# Patient Record
Sex: Female | Born: 1940 | Race: White | Hispanic: No | Smoking: Current every day smoker
Health system: Southern US, Community
[De-identification: ages and names within clinical notes are randomized; demographics above are authoritative.]

## PROBLEM LIST (undated history)

## (undated) DIAGNOSIS — I714 Abdominal aortic aneurysm, without rupture, unspecified: Secondary | ICD-10-CM

## (undated) DIAGNOSIS — K52831 Collagenous colitis: Secondary | ICD-10-CM

## (undated) DIAGNOSIS — J189 Pneumonia, unspecified organism: Secondary | ICD-10-CM

## (undated) DIAGNOSIS — R32 Unspecified urinary incontinence: Secondary | ICD-10-CM

## (undated) DIAGNOSIS — F32A Depression, unspecified: Secondary | ICD-10-CM

## (undated) DIAGNOSIS — F419 Anxiety disorder, unspecified: Secondary | ICD-10-CM

## (undated) DIAGNOSIS — I10 Essential (primary) hypertension: Secondary | ICD-10-CM

## (undated) DIAGNOSIS — E039 Hypothyroidism, unspecified: Secondary | ICD-10-CM

## (undated) DIAGNOSIS — I639 Cerebral infarction, unspecified: Secondary | ICD-10-CM

## (undated) DIAGNOSIS — E538 Deficiency of other specified B group vitamins: Secondary | ICD-10-CM

## (undated) DIAGNOSIS — M810 Age-related osteoporosis without current pathological fracture: Secondary | ICD-10-CM

## (undated) DIAGNOSIS — C3492 Malignant neoplasm of unspecified part of left bronchus or lung: Secondary | ICD-10-CM

## (undated) DIAGNOSIS — M5030 Other cervical disc degeneration, unspecified cervical region: Secondary | ICD-10-CM

## (undated) DIAGNOSIS — Z720 Tobacco use: Secondary | ICD-10-CM

## (undated) DIAGNOSIS — F329 Major depressive disorder, single episode, unspecified: Secondary | ICD-10-CM

## (undated) DIAGNOSIS — N189 Chronic kidney disease, unspecified: Secondary | ICD-10-CM

## (undated) DIAGNOSIS — E785 Hyperlipidemia, unspecified: Secondary | ICD-10-CM

## (undated) DIAGNOSIS — K469 Unspecified abdominal hernia without obstruction or gangrene: Secondary | ICD-10-CM

## (undated) DIAGNOSIS — J449 Chronic obstructive pulmonary disease, unspecified: Secondary | ICD-10-CM

## (undated) HISTORY — DX: Collagenous colitis: K52.831

## (undated) HISTORY — DX: Deficiency of other specified B group vitamins: E53.8

## (undated) HISTORY — DX: Essential (primary) hypertension: I10

## (undated) HISTORY — PX: ABDOMINAL HYSTERECTOMY: SHX81

## (undated) HISTORY — DX: Pneumonia, unspecified organism: J18.9

## (undated) HISTORY — DX: Hyperlipidemia, unspecified: E78.5

## (undated) HISTORY — DX: Tobacco use: Z72.0

## (undated) HISTORY — DX: Major depressive disorder, single episode, unspecified: F32.9

## (undated) HISTORY — PX: APPENDECTOMY: SHX54

## (undated) HISTORY — PX: BACK SURGERY: SHX140

## (undated) HISTORY — DX: Cerebral infarction, unspecified: I63.9

## (undated) HISTORY — DX: Unspecified urinary incontinence: R32

## (undated) HISTORY — DX: Age-related osteoporosis without current pathological fracture: M81.0

## (undated) HISTORY — PX: ABDOMINAL AORTIC ANEURYSM REPAIR: SUR1152

## (undated) HISTORY — DX: Unspecified abdominal hernia without obstruction or gangrene: K46.9

## (undated) HISTORY — DX: Other cervical disc degeneration, unspecified cervical region: M50.30

## (undated) HISTORY — PX: CHOLECYSTECTOMY: SHX55

## (undated) HISTORY — DX: Depression, unspecified: F32.A

## (undated) HISTORY — DX: Chronic kidney disease, unspecified: N18.9

---

## 2004-03-26 ENCOUNTER — Other Ambulatory Visit: Payer: Self-pay

## 2004-03-27 ENCOUNTER — Other Ambulatory Visit: Payer: Self-pay

## 2004-07-10 ENCOUNTER — Inpatient Hospital Stay: Payer: Self-pay | Admitting: Unknown Physician Specialty

## 2005-05-29 ENCOUNTER — Emergency Department: Payer: Self-pay | Admitting: Emergency Medicine

## 2005-12-05 ENCOUNTER — Ambulatory Visit: Payer: Self-pay | Admitting: Gastroenterology

## 2007-01-11 ENCOUNTER — Inpatient Hospital Stay: Payer: Self-pay | Admitting: Internal Medicine

## 2007-01-11 ENCOUNTER — Other Ambulatory Visit: Payer: Self-pay

## 2008-11-09 ENCOUNTER — Ambulatory Visit: Payer: Self-pay | Admitting: *Deleted

## 2009-11-08 ENCOUNTER — Ambulatory Visit: Payer: Self-pay | Admitting: Ophthalmology

## 2009-11-12 ENCOUNTER — Emergency Department: Payer: Self-pay | Admitting: Emergency Medicine

## 2009-11-15 ENCOUNTER — Ambulatory Visit: Payer: Self-pay | Admitting: Ophthalmology

## 2009-12-06 ENCOUNTER — Emergency Department: Payer: Self-pay | Admitting: Emergency Medicine

## 2009-12-20 ENCOUNTER — Ambulatory Visit: Payer: Self-pay | Admitting: General Surgery

## 2009-12-27 ENCOUNTER — Ambulatory Visit: Payer: Self-pay | Admitting: Ophthalmology

## 2010-05-20 ENCOUNTER — Ambulatory Visit: Payer: Self-pay | Admitting: General Surgery

## 2010-05-23 LAB — PATHOLOGY REPORT

## 2011-01-30 ENCOUNTER — Emergency Department: Payer: Self-pay | Admitting: *Deleted

## 2011-02-01 ENCOUNTER — Emergency Department: Payer: Self-pay | Admitting: *Deleted

## 2011-02-14 ENCOUNTER — Emergency Department: Payer: Self-pay | Admitting: *Deleted

## 2011-02-21 ENCOUNTER — Ambulatory Visit: Payer: Self-pay

## 2011-02-24 ENCOUNTER — Ambulatory Visit: Payer: Self-pay | Admitting: Unknown Physician Specialty

## 2011-02-28 ENCOUNTER — Ambulatory Visit: Payer: Self-pay | Admitting: Unknown Physician Specialty

## 2011-03-03 LAB — PATHOLOGY REPORT

## 2011-03-20 ENCOUNTER — Ambulatory Visit: Payer: Self-pay | Admitting: Surgery

## 2011-06-01 ENCOUNTER — Emergency Department: Payer: Self-pay | Admitting: Unknown Physician Specialty

## 2013-06-05 ENCOUNTER — Emergency Department: Payer: Self-pay | Admitting: Emergency Medicine

## 2013-06-06 LAB — COMPREHENSIVE METABOLIC PANEL
Albumin: 3.4 g/dL (ref 3.4–5.0)
Alkaline Phosphatase: 81 U/L
Anion Gap: 7 (ref 7–16)
Co2: 26 mmol/L (ref 21–32)
Creatinine: 1.01 mg/dL (ref 0.60–1.30)
EGFR (African American): >60
EGFR (Non-African Amer.): 55 — ABNORMAL LOW
Osmolality: 277 (ref 275–301)
Potassium: 4 mmol/L (ref 3.5–5.1)
SGPT (ALT): 18 U/L (ref 12–78)
Total Protein: 6.8 g/dL (ref 6.4–8.2)

## 2013-06-06 LAB — CBC WITH DIFFERENTIAL/PLATELET
Eosinophil #: 0.2 10*3/uL (ref 0.0–0.7)
Eosinophil %: 2.2 %
HCT: 36.3 % (ref 35.0–47.0)
HGB: 11.4 g/dL — ABNORMAL LOW (ref 12.0–16.0)
Lymphocyte #: 1.5 10*3/uL (ref 1.0–3.6)
Lymphocyte %: 18.3 %
MCH: 25.7 pg — ABNORMAL LOW (ref 26.0–34.0)
MCHC: 31.4 g/dL — ABNORMAL LOW (ref 32.0–36.0)
MCV: 82 fL (ref 80–100)
Monocyte #: 0.8 x10 3/mm (ref 0.2–0.9)
Neutrophil %: 69.2 %
Platelet: 187 10*3/uL (ref 150–440)
RBC: 4.43 10*6/uL (ref 3.80–5.20)
WBC: 8.4 10*3/uL (ref 3.6–11.0)

## 2013-06-06 LAB — URINALYSIS, COMPLETE
Blood: NEGATIVE
Glucose,UR: NEGATIVE mg/dL (ref 0–75)
Ketone: NEGATIVE
Nitrite: POSITIVE
Specific Gravity: 1.023 (ref 1.003–1.030)
Squamous Epithelial: 2
WBC UR: 37 /HPF (ref 0–5)

## 2013-06-08 LAB — URINE CULTURE

## 2013-07-02 ENCOUNTER — Ambulatory Visit: Payer: Self-pay | Admitting: Orthopedic Surgery

## 2013-07-10 ENCOUNTER — Ambulatory Visit: Payer: Self-pay | Admitting: Orthopedic Surgery

## 2013-07-10 DIAGNOSIS — R0602 Shortness of breath: Secondary | ICD-10-CM

## 2013-09-11 ENCOUNTER — Ambulatory Visit: Payer: Self-pay | Admitting: Orthopedic Surgery

## 2013-09-12 LAB — PATHOLOGY REPORT

## 2014-01-21 ENCOUNTER — Ambulatory Visit: Payer: Self-pay | Admitting: Family Medicine

## 2014-09-08 ENCOUNTER — Emergency Department: Payer: Self-pay | Admitting: Emergency Medicine

## 2014-09-08 DIAGNOSIS — I10 Essential (primary) hypertension: Secondary | ICD-10-CM | POA: Diagnosis not present

## 2014-09-08 DIAGNOSIS — J449 Chronic obstructive pulmonary disease, unspecified: Secondary | ICD-10-CM | POA: Diagnosis not present

## 2014-09-08 DIAGNOSIS — Z7902 Long term (current) use of antithrombotics/antiplatelets: Secondary | ICD-10-CM | POA: Diagnosis not present

## 2014-09-08 DIAGNOSIS — Z87891 Personal history of nicotine dependence: Secondary | ICD-10-CM | POA: Diagnosis not present

## 2014-09-08 DIAGNOSIS — Z79899 Other long term (current) drug therapy: Secondary | ICD-10-CM | POA: Diagnosis not present

## 2014-09-08 DIAGNOSIS — R0789 Other chest pain: Secondary | ICD-10-CM | POA: Diagnosis not present

## 2014-09-16 ENCOUNTER — Emergency Department: Payer: Self-pay | Admitting: Emergency Medicine

## 2014-09-16 DIAGNOSIS — R55 Syncope and collapse: Secondary | ICD-10-CM | POA: Diagnosis not present

## 2014-09-16 DIAGNOSIS — I10 Essential (primary) hypertension: Secondary | ICD-10-CM | POA: Diagnosis not present

## 2014-09-16 DIAGNOSIS — S22000A Wedge compression fracture of unspecified thoracic vertebra, initial encounter for closed fracture: Secondary | ICD-10-CM | POA: Diagnosis not present

## 2014-09-16 DIAGNOSIS — M8448XA Pathological fracture, other site, initial encounter for fracture: Secondary | ICD-10-CM | POA: Diagnosis not present

## 2014-09-16 DIAGNOSIS — S3991XA Unspecified injury of abdomen, initial encounter: Secondary | ICD-10-CM | POA: Diagnosis not present

## 2014-09-16 DIAGNOSIS — I77810 Thoracic aortic ectasia: Secondary | ICD-10-CM | POA: Diagnosis not present

## 2014-09-16 DIAGNOSIS — S060X9A Concussion with loss of consciousness of unspecified duration, initial encounter: Secondary | ICD-10-CM | POA: Diagnosis not present

## 2014-09-16 DIAGNOSIS — I2510 Atherosclerotic heart disease of native coronary artery without angina pectoris: Secondary | ICD-10-CM | POA: Diagnosis not present

## 2014-09-18 DIAGNOSIS — I700 Atherosclerosis of aorta: Secondary | ICD-10-CM | POA: Diagnosis not present

## 2014-09-18 DIAGNOSIS — I10 Essential (primary) hypertension: Secondary | ICD-10-CM | POA: Diagnosis not present

## 2014-09-18 DIAGNOSIS — F17200 Nicotine dependence, unspecified, uncomplicated: Secondary | ICD-10-CM | POA: Diagnosis not present

## 2014-09-18 DIAGNOSIS — I639 Cerebral infarction, unspecified: Secondary | ICD-10-CM | POA: Diagnosis not present

## 2014-09-18 DIAGNOSIS — I714 Abdominal aortic aneurysm, without rupture: Secondary | ICD-10-CM | POA: Diagnosis not present

## 2014-09-18 DIAGNOSIS — I701 Atherosclerosis of renal artery: Secondary | ICD-10-CM | POA: Diagnosis not present

## 2014-10-24 NOTE — Op Note (Signed)
PATIENT NAME:  Melody Green, KUSHNIR MR#:  [id number] DATE OF BIRTH:  11-26-1940  DATE OF PROCEDURE:  09/11/2013  PREOPERATIVE DIAGNOSIS: T10 compression fracture.   POSTOPERATIVE DIAGNOSIS: T10 compression fracture.  PROCEDURE: T10 kyphoplasty and biopsy.   ANESTHESIA: MAC.   SURGEON: Hessie Knows, M.D.   DESCRIPTION OF PROCEDURE: The patient was brought to the operating room and after adequate anesthesia was obtained, the patient was placed prone and good visualization on AP and lateral projections was obtained. After timeout procedure was completed, 5 mL of 1% Xylocaine was infiltrated on either side of the vertebral body as initial local anesthetic. The back was then prepped and draped in the usual sterile fashion and a repeat timeout procedure completed. Spinal needle was used to give local anesthetic down to the pedicle on the right side. A small incision was made. A trocar was placed, a perpendicular approach with good position of the initial trocar. Biopsy was obtained and with drilling, the midline was crossed and so it was felt 1 side stick was all that was required. The balloon was inflated to 3 mL. There was good pressure and no difficulty of balloon inflation. The balloon was then removed and the cavity filled with approximately 3.5 mL of bone cement. There is good interdigitation of the bone and good fill on the right and left sides. The trocar was removed and permanent C-arm views were obtained. The wound was covered with Dermabond followed by a Band-Aid. The patient was sent to the recovery room in stable condition.   ESTIMATED BLOOD LOSS: Minimal.   COMPLICATIONS: None.   SPECIMEN: T10 vertebral body biopsy.   ____________________________ Laurene Footman, MD [email or web address] D: 09/11/2013 18:51:55 ET T: 09/12/2013 06:52:02 ET JOB#: [id number]  cc: Laurene Footman, MD, <Dictator> Laurene Footman MD ELECTRONICALLY SIGNED 09/12/2013 10:58

## 2014-11-18 DIAGNOSIS — Z833 Family history of diabetes mellitus: Secondary | ICD-10-CM | POA: Diagnosis not present

## 2014-11-18 DIAGNOSIS — I129 Hypertensive chronic kidney disease with stage 1 through stage 4 chronic kidney disease, or unspecified chronic kidney disease: Secondary | ICD-10-CM | POA: Diagnosis not present

## 2014-11-18 DIAGNOSIS — R55 Syncope and collapse: Secondary | ICD-10-CM | POA: Diagnosis not present

## 2014-11-18 DIAGNOSIS — E039 Hypothyroidism, unspecified: Secondary | ICD-10-CM | POA: Diagnosis not present

## 2014-12-04 ENCOUNTER — Encounter: Payer: Self-pay | Admitting: Family Medicine

## 2014-12-04 ENCOUNTER — Ambulatory Visit (INDEPENDENT_AMBULATORY_CARE_PROVIDER_SITE_OTHER): Payer: Commercial Managed Care - HMO | Admitting: Family Medicine

## 2014-12-04 VITALS — BP 131/78 | HR 79 | Temp 98.2°F | Ht 63.5 in | Wt 96.6 lb

## 2014-12-04 DIAGNOSIS — E871 Hypo-osmolality and hyponatremia: Secondary | ICD-10-CM | POA: Diagnosis not present

## 2014-12-04 DIAGNOSIS — I10 Essential (primary) hypertension: Secondary | ICD-10-CM | POA: Diagnosis not present

## 2014-12-04 DIAGNOSIS — N184 Chronic kidney disease, stage 4 (severe): Secondary | ICD-10-CM

## 2014-12-04 DIAGNOSIS — I129 Hypertensive chronic kidney disease with stage 1 through stage 4 chronic kidney disease, or unspecified chronic kidney disease: Secondary | ICD-10-CM | POA: Insufficient documentation

## 2014-12-04 DIAGNOSIS — R42 Dizziness and giddiness: Secondary | ICD-10-CM

## 2014-12-04 NOTE — Progress Notes (Signed)
BP 131/78 mmHg  Pulse 79  Temp(Src) 98.2 F (36.8 C)  Ht 5' 3.5" (1.613 m)  Wt 96 lb 9.6 oz (43.817 kg)  BMI 16.84 kg/m2  SpO2 97%  LMP  (Within Years)   Subjective:    Patient ID: CAMYRA VAETH, female    DOB: 07-Mar-1941, 74 y.o.   MRN: [id number]  HPI: Melody Green is a 74 y.o. female presenting on 12/04/2014 for Hypertension She notes that she is still not feeling well. She has continued with a blood pressure that keeps jumping up and down. She checks it several times a day, but continues with lots of dizziness. She notes that her BP jumps from the 87G systolic to the 811X, and she feels like it is limiting her activity at this time and also makes both her and her son very anxious that she is going to have another stroke.  HYPERTENSION-  Hypertension status: uncontrolled Satisfied with current treatment? no Duration of hypertension: chronic BP monitoring frequency:  a few times a day BP range:  BP medication side effects:  no Medication compliance: excellent compliance Aspirin: no Recurrent headaches: yes Visual changes: yes Palpitations: no Dyspnea: no Chest pain: no Lower extremity edema: no Dizzy/lightheaded: yes  DIZZINESS Duration: months, every day, still happening even after stopping medication Description of symptoms: lightheaded Duration of episode: seconds Dizziness frequency: recurrent Provoking factors: going from sitting to standing, moving around a lot Aggravating factors:  going from sitting to standing, moving around a lot Triggered by rolling over in bed: no Triggered by bending over: yes Aggravated by head movement: yes Aggravated by exertion, coughing, loud noises: no Recent head injury: no Recent or current viral symptoms: no History of vasovagal episodes: no Nausea: yes Vomiting: yes Tinnitus: no Hearing loss: no Aural fullness: no Headache: yes Photophobia/phonophobia: no Unsteady gait: no Postural instability: yes Diplopia, dysarthria,  dysphagia or weakness: no Related to exertion: yes Pallor: no Diaphoresis: no Dyspnea: no Chest pain: no  Relevant past medical, surgical, family and social history reviewed and updated as indicated. Interim medical history since our last visit reviewed. Allergies and medications reviewed and updated.  Current Outpatient Prescriptions on File Prior to Visit  Medication Sig  . citalopram (CELEXA) 10 MG tablet Take 10 mg by mouth daily.  . clopidogrel (PLAVIX) 75 MG tablet Take 75 mg by mouth daily.  Marland Kitchen levothyroxine (SYNTHROID, LEVOTHROID) 50 MCG tablet Take 50 mcg by mouth daily.  Marland Kitchen levothyroxine (SYNTHROID, LEVOTHROID) 75 MCG tablet Take 75 mcg by mouth daily before breakfast.  . LORazepam (ATIVAN) 1 MG tablet Take 1-2 mg by mouth daily as needed for anxiety.  . mometasone (NASONEX) 50 MCG/ACT nasal spray Place 2 sprays into the nose daily.  Marland Kitchen buPROPion (WELLBUTRIN SR) 150 MG 12 hr tablet Take 150 mg by mouth 2 (two) times daily.  Marland Kitchen gabapentin (NEURONTIN) 400 MG capsule Take 400 mg by mouth daily.  Marland Kitchen lisinopril (PRINIVIL,ZESTRIL) 5 MG tablet Take 5 mg by mouth daily.  . Nutritional Supplements (CARNATION INSTANT BREAKFAST PO) Take 1 Dose by mouth.   No current facility-administered medications on file prior to visit.    Review of Systems  Constitutional: Negative.   HENT: Negative.   Eyes: Negative.   Respiratory: Negative.   Cardiovascular: Negative.   Gastrointestinal: Positive for nausea, vomiting, diarrhea and constipation. Negative for abdominal pain and blood in stool.  Endocrine: Negative.   Psychiatric/Behavioral: Negative.     Per HPI unless specifically indicated above  Objective:    BP 131/78 mmHg  Pulse 79  Temp(Src) 98.2 F (36.8 C)  Ht 5' 3.5" (1.613 m)  Wt 96 lb 9.6 oz (43.817 kg)  BMI 16.84 kg/m2  SpO2 97%  LMP  (Within Years)  Wt Readings from Last 3 Encounters:  12/04/14 96 lb 9.6 oz (43.817 kg)  09/28/14 97 lb (43.999 kg)    Physical Exam   Constitutional: She appears well-developed and well-nourished.  HENT:  Head: Normocephalic and atraumatic.  Eyes: Conjunctivae and EOM are normal. Pupils are equal, round, and reactive to light.  Neck: Normal range of motion. Neck supple.  Cardiovascular: Normal rate and regular rhythm.   Pulmonary/Chest: Effort normal and breath sounds normal.        Assessment & Plan:   Problem List Items Addressed This Visit    HTN (hypertension) - Primary    Gale presents today for follow up on her HTN and her dizziness. She is still not feeling well. She has continued with a very labile blood pressure. It has been going from systolics in the 67E to systolics in the 720N. She is very anxious about possibly having another stroke, but is also anxious that she may fall and pass out. BP is stable today, but given patient's continued symptoms off antihypertensive medication, we will refer to cardiology for evaluation and suggestions regarding her blood pressure.       Relevant Medications   atorvastatin (LIPITOR) 20 MG tablet   Other Relevant Orders   Ambulatory referral to Cardiology   Dizziness    Her dizziness seems to be multifactorial. It may be due to her hyponatremia, as discussed below. It may be due to her labile HTN, which is what she thinks it might be. It may be due to her polypharmacy and her many psychiatric medications. Her thyroid function was normal the last time we checked 2 weeks ago. She was not anemic. It does not seem to be positional. We will get her in to see cardiology for evaluation of her labile blood pressure. She is due to come back here in 3 weeks for her regular follow up. Continue to monitor. Information regarding dizziness given to patient today.       Hyponatremia    Sodium 130 last visit- potentially from her citalopram. She had been slightly liberalizing salt for the last 2 weeks. BMP checked today for recheck. Await results. Patient returning in 3 weeks for regular  visit and continue to monitor.       Relevant Orders   Basic metabolic panel       Follow up plan: Return As scheduled for appt with MAC.

## 2014-12-04 NOTE — Assessment & Plan Note (Signed)
Melody Green presents today for follow up on her HTN and her dizziness. She is still not feeling well. She has continued with a very labile blood pressure. It has been going from systolics in the 47M to systolics in the 546T. She is very anxious about possibly having another stroke, but is also anxious that she may fall and pass out. BP is stable today, but given patient's continued symptoms off antihypertensive medication, we will refer to cardiology for evaluation and suggestions regarding her blood pressure.

## 2014-12-04 NOTE — Assessment & Plan Note (Signed)
Her dizziness seems to be multifactorial. It may be due to her hyponatremia, as discussed below. It may be due to her labile HTN, which is what she thinks it might be. It may be due to her polypharmacy and her many psychiatric medications. Her thyroid function was normal the last time we checked 2 weeks ago. She was not anemic. It does not seem to be positional. We will get her in to see cardiology for evaluation of her labile blood pressure. She is due to come back here in 3 weeks for her regular follow up. Continue to monitor. Information regarding dizziness given to patient today.

## 2014-12-04 NOTE — Assessment & Plan Note (Signed)
Sodium 130 last visit- potentially from her citalopram. She had been slightly liberalizing salt for the last 2 weeks. BMP checked today for recheck. Await results. Patient returning in 3 weeks for regular visit and continue to monitor.

## 2014-12-04 NOTE — Patient Instructions (Signed)
Dizziness Dizziness is a common problem. It is a feeling of unsteadiness or light-headedness. You may feel like you are about to faint. Dizziness can lead to injury if you stumble or fall. A person of any age group can suffer from dizziness, but dizziness is more common in older adults. CAUSES  Dizziness can be caused by many different things, including:  Middle ear problems.  Standing for too long.  Infections.  An allergic reaction.  Aging.  An emotional response to something, such as the sight of blood.  Side effects of medicines.  Tiredness.  Problems with circulation or blood pressure.  Excessive use of alcohol or medicines, or illegal drug use.  Breathing too fast (hyperventilation).  An irregular heart rhythm (arrhythmia).  A low red blood cell count (anemia).  Pregnancy.  Vomiting, diarrhea, fever, or other illnesses that cause body fluid loss (dehydration).  Diseases or conditions such as Parkinson's disease, high blood pressure (hypertension), diabetes, and thyroid problems.  Exposure to extreme heat. DIAGNOSIS  Your health care provider will ask about your symptoms, perform a physical exam, and perform an electrocardiogram (ECG) to record the electrical activity of your heart. Your health care provider may also perform other heart or blood tests to determine the cause of your dizziness. These may include:  Transthoracic echocardiogram (TTE). During echocardiography, sound waves are used to evaluate how blood flows through your heart.  Transesophageal echocardiogram (TEE).  Cardiac monitoring. This allows your health care provider to monitor your heart rate and rhythm in real time.  Holter monitor. This is a portable device that records your heartbeat and can help diagnose heart arrhythmias. It allows your health care provider to track your heart activity for several days if needed.  Stress tests by exercise or by giving medicine that makes the heart beat  faster. TREATMENT  Treatment of dizziness depends on the cause of your symptoms and can vary greatly. HOME CARE INSTRUCTIONS   Drink enough fluids to keep your urine clear or pale yellow. This is especially important in very hot weather. In older adults, it is also important in cold weather.  Take your medicine exactly as directed if your dizziness is caused by medicines. When taking blood pressure medicines, it is especially important to get up slowly.  Rise slowly from chairs and steady yourself until you feel okay.  In the morning, first sit up on the side of the bed. When you feel okay, stand slowly while holding onto something until you know your balance is fine.  Move your legs often if you need to stand in one place for a long time. Tighten and relax your muscles in your legs while standing.  Have someone stay with you for 1-2 days if dizziness continues to be a problem. Do this until you feel you are well enough to stay alone. Have the person call your health care provider if he or she notices changes in you that are concerning.  Do not drive or use heavy machinery if you feel dizzy.  Do not drink alcohol. SEEK IMMEDIATE MEDICAL CARE IF:   Your dizziness or light-headedness gets worse.  You feel nauseous or vomit.  You have problems talking, walking, or using your arms, hands, or legs.  You feel weak.  You are not thinking clearly or you have trouble forming sentences. It may take a friend or family member to notice this.  You have chest pain, abdominal pain, shortness of breath, or sweating.  Your vision changes.  You notice   any bleeding.  You have side effects from medicine that seems to be getting worse rather than better. MAKE SURE YOU:   Understand these instructions.  Will watch your condition.  Will get help right away if you are not doing well or get worse. Document Released: 12/13/2000 Document Revised: 06/24/2013 Document Reviewed: 01/06/2011 [facility]  Patient Information 2015 [place], [place]. This information is not intended to replace advice given to you by your health care provider. Make sure you discuss any questions you have with your health care provider. Hyponatremia  Hyponatremia is when the salt (sodium) in your blood is low. When salt becomes low, your cells take in extra water and puff up (swell). The puffiness can happen in the whole body. It mostly affects the brain and is very serious.  HOME CARE  Only take medicine as told by your doctor.  Follow any diet instructions you were given. This includes limiting how much fluid you drink.  Keep all doctor visits for tests as told.  Avoid alcohol and drugs. GET HELP RIGHT AWAY IF:  You start to twitch and shake (seize).  You pass out (faint).  You continue to have watery poop (diarrhea) or you throw up (vomit).  You feel sick to your stomach (nauseous).  You are tired (fatigued), have a headache, are confused, or feel weak.  Your problems that first brought you to the doctor come back.  You have trouble following your diet instructions. MAKE SURE YOU:   Understand these instructions.  Will watch your condition.  Will get help right away if you are not doing well or get worse. Document Released: 03/01/2011 Document Revised: 09/11/2011 Document Reviewed: 03/01/2011 [facility] Patient Information 2015 [place], [place]. This information is not intended to replace advice given to you by your health care provider. Make sure you discuss any questions you have with your health care provider.

## 2014-12-05 LAB — BASIC METABOLIC PANEL
BUN / CREAT RATIO: 11 (ref 11–26)
BUN: 11 mg/dL (ref 8–27)
CO2: 23 mmol/L (ref 18–29)
Calcium: 9.1 mg/dL (ref 8.7–10.3)
Chloride: 99 mmol/L (ref 97–108)
Creatinine, Ser: 0.97 mg/dL (ref 0.57–1.00)
GFR calc non Af Amer: 58 mL/min/{1.73_m2} — ABNORMAL LOW (ref >59)
GFR, EST AFRICAN AMERICAN: 67 mL/min/{1.73_m2} (ref >59)
Glucose: 65 mg/dL (ref 65–99)
Potassium: 4.3 mmol/L (ref 3.5–5.2)
Sodium: 138 mmol/L (ref 134–144)

## 2014-12-10 DIAGNOSIS — Z961 Presence of intraocular lens: Secondary | ICD-10-CM | POA: Diagnosis not present

## 2014-12-15 ENCOUNTER — Other Ambulatory Visit: Payer: Self-pay | Admitting: Family Medicine

## 2014-12-18 DIAGNOSIS — E782 Mixed hyperlipidemia: Secondary | ICD-10-CM | POA: Diagnosis not present

## 2014-12-18 DIAGNOSIS — G454 Transient global amnesia: Secondary | ICD-10-CM | POA: Diagnosis not present

## 2014-12-18 DIAGNOSIS — I10 Essential (primary) hypertension: Secondary | ICD-10-CM | POA: Diagnosis not present

## 2014-12-21 DIAGNOSIS — Z1231 Encounter for screening mammogram for malignant neoplasm of breast: Secondary | ICD-10-CM | POA: Diagnosis not present

## 2014-12-23 ENCOUNTER — Ambulatory Visit (INDEPENDENT_AMBULATORY_CARE_PROVIDER_SITE_OTHER): Payer: Commercial Managed Care - HMO | Admitting: Family Medicine

## 2014-12-23 ENCOUNTER — Encounter: Payer: Self-pay | Admitting: Family Medicine

## 2014-12-23 VITALS — BP 143/76 | HR 81 | Temp 98.0°F | Ht 63.9 in | Wt 96.6 lb

## 2014-12-23 DIAGNOSIS — I10 Essential (primary) hypertension: Secondary | ICD-10-CM | POA: Diagnosis not present

## 2014-12-23 DIAGNOSIS — E785 Hyperlipidemia, unspecified: Secondary | ICD-10-CM

## 2014-12-23 DIAGNOSIS — F329 Major depressive disorder, single episode, unspecified: Secondary | ICD-10-CM | POA: Diagnosis not present

## 2014-12-23 DIAGNOSIS — F32A Depression, unspecified: Secondary | ICD-10-CM | POA: Insufficient documentation

## 2014-12-23 DIAGNOSIS — Z0000 Encounter for general adult medical examination without abnormal findings: Secondary | ICD-10-CM | POA: Diagnosis not present

## 2014-12-23 LAB — URINALYSIS, ROUTINE W REFLEX MICROSCOPIC
Bilirubin, UA: NEGATIVE
Glucose, UA: NEGATIVE
Ketones, UA: NEGATIVE
Nitrite, UA: NEGATIVE
Protein, UA: NEGATIVE
RBC, UA: NEGATIVE
SPEC GRAV UA: 1.01 (ref 1.005–1.030)
Urobilinogen, Ur: 0.2 mg/dL (ref 0.2–1.0)
pH, UA: 6 (ref 5.0–7.5)

## 2014-12-23 LAB — MICROSCOPIC EXAMINATION

## 2014-12-23 MED ORDER — BUPROPION HCL ER (SR) 150 MG PO TB12: 150.0000 mg | ORAL_TABLET | Freq: Two times a day (BID) | ORAL | Status: DC

## 2014-12-23 MED ORDER — ATORVASTATIN CALCIUM 20 MG PO TABS: 20.0000 mg | ORAL_TABLET | Freq: Every day | ORAL | Status: DC

## 2014-12-23 MED ORDER — LEVOTHYROXINE SODIUM 75 MCG PO TABS: 75.0000 ug | ORAL_TABLET | Freq: Every day | ORAL | Status: DC

## 2014-12-23 MED ORDER — GABAPENTIN 400 MG PO CAPS: 400.0000 mg | ORAL_CAPSULE | Freq: Every day | ORAL | Status: DC

## 2014-12-23 MED ORDER — CITALOPRAM HYDROBROMIDE 10 MG PO TABS: 10.0000 mg | ORAL_TABLET | Freq: Every day | ORAL | Status: DC

## 2014-12-23 MED ORDER — AMLODIPINE BESYLATE 2.5 MG PO TABS: 2.5000 mg | ORAL_TABLET | Freq: Every day | ORAL | Status: DC

## 2014-12-23 MED ORDER — LEVOTHYROXINE SODIUM 50 MCG PO TABS: 50.0000 ug | ORAL_TABLET | Freq: Every day | ORAL | Status: DC

## 2014-12-23 MED ORDER — LISINOPRIL 5 MG PO TABS: 5.0000 mg | ORAL_TABLET | Freq: Every day | ORAL | Status: DC

## 2014-12-23 NOTE — Assessment & Plan Note (Signed)
The current medical regimen is effective;  continue present plan and medications.  

## 2014-12-23 NOTE — Progress Notes (Signed)
BP 143/76 mmHg  Pulse 81  Temp(Src) 98 F (36.7 C)  Ht 5' 3.9" (1.623 m)  Wt 96 lb 9.6 oz (43.817 kg)  BMI 16.63 kg/m2  SpO2 98%  LMP  (Within Years)   Subjective:    Patient ID: Melody Green, female    DOB: [birth year], 74 y.o.   MRN: [id number]  HPI: Melody Green is a 74 y.o. female  No chief complaint on file.  Medicare wellness Unable to show updates but depression and fall risk done Pt doing well Mult med prob stable Takes meds long term no side effects and every day  Has tx from derm for AK on nose which is red and peeling.   Relevant past medical, surgical, family and social history reviewed and updated as indicated. Interim medical history since our last visit reviewed. Allergies and medications reviewed and updated.  Review of Systems  Constitutional: Negative.   HENT: Negative.   Eyes: Negative.   Respiratory: Negative.   Cardiovascular: Negative.   Gastrointestinal: Positive for constipation.  Endocrine: Negative.   Genitourinary: Negative.   Musculoskeletal: Negative.   Skin: Negative.   Allergic/Immunologic: Negative.   Neurological: Negative.   Hematological: Negative.   Psychiatric/Behavioral: Negative.     Per HPI unless specifically indicated above     Objective:    BP 143/76 mmHg  Pulse 81  Temp(Src) 98 F (36.7 C)  Ht 5' 3.9" (1.623 m)  Wt 96 lb 9.6 oz (43.817 kg)  BMI 16.63 kg/m2  SpO2 98%  LMP  (Within Years)  Wt Readings from Last 3 Encounters:  12/23/14 96 lb 9.6 oz (43.817 kg)  12/04/14 96 lb 9.6 oz (43.817 kg)  09/28/14 97 lb (43.999 kg)    Physical Exam  Constitutional: She is oriented to person, place, and time. She appears well-developed and well-nourished.  HENT:  Head: Normocephalic and atraumatic.  Right Ear: External ear normal.  Left Ear: External ear normal.  Nose: Nose normal.  Mouth/Throat: Oropharynx is clear and moist.  Eyes: Conjunctivae and EOM are normal. Pupils are equal, round, and reactive to light.   Neck: Normal range of motion. Neck supple. Carotid bruit is not present.  Cardiovascular: Normal rate, regular rhythm and normal heart sounds.   No murmur heard. Pulmonary/Chest: Effort normal and breath sounds normal. Right breast exhibits no mass and no tenderness. Left breast exhibits no mass and no tenderness. Breasts are symmetrical.  Abdominal: Soft. Bowel sounds are normal. There is no hepatosplenomegaly.  Musculoskeletal: Normal range of motion.  Neurological: She is alert and oriented to person, place, and time.  Skin: No rash noted.  Psychiatric: She has a normal mood and affect. Her behavior is normal. Judgment and thought content normal.    Results for orders placed or performed in visit on 78/29/56  Basic metabolic panel  Result Value Ref Range   Glucose 65 65 - 99 mg/dL   BUN 11 8 - 27 mg/dL   Creatinine, Ser 0.97 0.57 - 1.00 mg/dL   GFR calc non Af Amer 58 (L) >59 mL/min/1.73   GFR calc Af Amer 67 >59 mL/min/1.73   BUN/Creatinine Ratio 11 11 - 26   Sodium 138 134 - 144 mmol/L   Potassium 4.3 3.5 - 5.2 mmol/L   Chloride 99 97 - 108 mmol/L   CO2 23 18 - 29 mmol/L   Calcium 9.1 8.7 - 10.3 mg/dL      Assessment & Plan:   Problem List Items Addressed This Visit  Cardiovascular and Mediastinum   HTN (hypertension) - Primary    The current medical regimen is effective;  continue present plan and medications.       Relevant Medications   amLODipine (NORVASC) 2.5 MG tablet     Other   Depression    The current medical regimen is effective;  continue present plan and medications.       Hyperlipidemia    The current medical regimen is effective;  continue present plan and medications.       Relevant Medications   amLODipine (NORVASC) 2.5 MG tablet       Follow up plan: Return in about 6 months (around 06/24/2015) for f/u meds, BMP,Lipids , ALT, AST.

## 2014-12-24 LAB — LIPID PANEL
CHOLESTEROL TOTAL: 162 mg/dL (ref 100–199)
Chol/HDL Ratio: 2.6 ratio units (ref 0.0–4.4)
HDL: 63 mg/dL (ref >39)
LDL Calculated: 85 mg/dL (ref 0–99)
Triglycerides: 69 mg/dL (ref 0–149)
VLDL CHOLESTEROL CAL: 14 mg/dL (ref 5–40)

## 2014-12-24 LAB — COMPREHENSIVE METABOLIC PANEL
A/G RATIO: 1.7 (ref 1.1–2.5)
ALT: 12 IU/L (ref 0–32)
AST: 21 IU/L (ref 0–40)
Albumin: 4 g/dL (ref 3.5–4.8)
Alkaline Phosphatase: 94 IU/L (ref 39–117)
BUN/Creatinine Ratio: 9 — ABNORMAL LOW (ref 11–26)
BUN: 8 mg/dL (ref 8–27)
Bilirubin Total: 0.3 mg/dL (ref 0.0–1.2)
CO2: 23 mmol/L (ref 18–29)
Calcium: 9.1 mg/dL (ref 8.7–10.3)
Chloride: 100 mmol/L (ref 97–108)
Creatinine, Ser: 0.9 mg/dL (ref 0.57–1.00)
GFR calc Af Amer: 72 mL/min/{1.73_m2} (ref >59)
GFR calc non Af Amer: 63 mL/min/{1.73_m2} (ref >59)
Globulin, Total: 2.4 g/dL (ref 1.5–4.5)
Glucose: 64 mg/dL — ABNORMAL LOW (ref 65–99)
Potassium: 4.7 mmol/L (ref 3.5–5.2)
Sodium: 138 mmol/L (ref 134–144)
TOTAL PROTEIN: 6.4 g/dL (ref 6.0–8.5)

## 2014-12-24 LAB — CBC WITH DIFFERENTIAL/PLATELET
BASOS: 1 %
Basophils Absolute: 0.1 10*3/uL (ref 0.0–0.2)
EOS (ABSOLUTE): 0.2 10*3/uL (ref 0.0–0.4)
EOS: 3 %
HEMOGLOBIN: 12.8 g/dL (ref 11.1–15.9)
Hematocrit: 39.8 % (ref 34.0–46.6)
Immature Grans (Abs): 0 10*3/uL (ref 0.0–0.1)
Immature Granulocytes: 0 %
LYMPHS ABS: 1.5 10*3/uL (ref 0.7–3.1)
Lymphs: 23 %
MCH: 26.8 pg (ref 26.6–33.0)
MCHC: 32.2 g/dL (ref 31.5–35.7)
MCV: 83 fL (ref 79–97)
MONOS ABS: 0.6 10*3/uL (ref 0.1–0.9)
Monocytes: 10 %
NEUTROS ABS: 4.1 10*3/uL (ref 1.4–7.0)
Neutrophils: 63 %
Platelets: 207 10*3/uL (ref 150–379)
RBC: 4.77 x10E6/uL (ref 3.77–5.28)
RDW: 14.6 % (ref 12.3–15.4)
WBC: 6.4 10*3/uL (ref 3.4–10.8)

## 2014-12-24 LAB — TSH: TSH: 3.61 u[IU]/mL (ref 0.450–4.500)

## 2014-12-28 ENCOUNTER — Telehealth: Payer: Self-pay | Admitting: Family Medicine

## 2014-12-28 NOTE — Telephone Encounter (Signed)
meds were all filled at appointment

## 2014-12-28 NOTE — Telephone Encounter (Signed)
Pt called needs refill on 3 different medications pt does not know the meds needed. Please call [place] @ [phone]. Thanks.

## 2014-12-30 ENCOUNTER — Other Ambulatory Visit: Payer: Self-pay | Admitting: Family Medicine

## 2014-12-30 ENCOUNTER — Telehealth: Payer: Self-pay

## 2014-12-30 DIAGNOSIS — F329 Major depressive disorder, single episode, unspecified: Secondary | ICD-10-CM

## 2014-12-30 DIAGNOSIS — F32A Depression, unspecified: Secondary | ICD-10-CM

## 2014-12-30 MED ORDER — GABAPENTIN 300 MG PO CAPS: 300.0000 mg | ORAL_CAPSULE | Freq: Every day | ORAL | Status: DC

## 2014-12-30 NOTE — Telephone Encounter (Signed)
Phone call As per note below  Need to check  Ask pt to be seen [facility]

## 2014-12-30 NOTE — Telephone Encounter (Signed)
Patient has a "sore, dark spot" on her breast and wants to discuss with MAC

## 2014-12-31 ENCOUNTER — Ambulatory Visit (INDEPENDENT_AMBULATORY_CARE_PROVIDER_SITE_OTHER): Payer: Commercial Managed Care - HMO | Admitting: Family Medicine

## 2014-12-31 ENCOUNTER — Encounter: Payer: Self-pay | Admitting: Family Medicine

## 2014-12-31 VITALS — BP 136/80 | HR 79 | Temp 98.4°F | Ht 64.0 in | Wt 98.4 lb

## 2014-12-31 DIAGNOSIS — R58 Hemorrhage, not elsewhere classified: Secondary | ICD-10-CM | POA: Diagnosis not present

## 2014-12-31 NOTE — Progress Notes (Signed)
BP 136/80 mmHg  Pulse 79  Temp(Src) 98.4 F (36.9 C)  Ht '5\' 4"'$  (1.626 m)  Wt 98 lb 6.4 oz (44.634 kg)  BMI 16.88 kg/m2  SpO2 98%  LMP  (Within Years)   Subjective:    Patient ID: Melody Green, female    DOB: [birth year], 74 y.o.   MRN: [id number]  HPI: Melody Green is a 74 y.o. female  Chief Complaint  Patient presents with  . Breast Mass    left breast, found it two days ago. She states that there is pain there at times that travels to the underarm. There is a dark spot there on her breast also.   SKIN LESION/BREAST LUMP Duration: 2 days Location: L breast with some discoloration Painful: yes- stinging feeling Itching: yes Onset: sudden Context: bigger Associated signs and symptoms: Nothing  Relevant past medical, surgical, family and social history reviewed and updated as indicated. Interim medical history since our last visit reviewed. Allergies and medications reviewed and updated.  Review of Systems  Constitutional: Negative.   Respiratory: Negative.   Cardiovascular: Negative.   Skin: Positive for color change. Negative for pallor, rash and wound.  Hematological: Negative.   Psychiatric/Behavioral: Negative.     Per HPI unless specifically indicated above     Objective:    BP 136/80 mmHg  Pulse 79  Temp(Src) 98.4 F (36.9 C)  Ht '5\' 4"'$  (1.626 m)  Wt 98 lb 6.4 oz (44.634 kg)  BMI 16.88 kg/m2  SpO2 98%  LMP  (Within Years)  Wt Readings from Last 3 Encounters:  12/31/14 98 lb 6.4 oz (44.634 kg)  12/23/14 96 lb 9.6 oz (43.817 kg)  12/04/14 96 lb 9.6 oz (43.817 kg)    Physical Exam  Constitutional: She is oriented to person, place, and time. She appears well-developed and well-nourished. No distress.  HENT:  Head: Normocephalic and atraumatic.  Right Ear: Hearing normal.  Left Ear: Hearing normal.  Nose: Nose normal.  Eyes: Conjunctivae and lids are normal. Right eye exhibits no discharge. Left eye exhibits no discharge. No scleral icterus.    Cardiovascular: Normal rate, regular rhythm and normal heart sounds.  Exam reveals no gallop and no friction rub.   No murmur heard. Pulmonary/Chest: Effort normal. No respiratory distress. She has no wheezes. She has no rales. She exhibits no tenderness. Right breast exhibits no inverted nipple, no mass, no nipple discharge, no skin change and no tenderness. Left breast exhibits skin change. Left breast exhibits no inverted nipple, no mass, no nipple discharge and no tenderness. Breasts are symmetrical.    Ecchymosis in the inner quadrants at 9 o'clock of the L breast   Musculoskeletal: Normal range of motion.  Neurological: She is alert and oriented to person, place, and time.  Skin: Skin is intact. No rash noted.  Psychiatric: She has a normal mood and affect. Her speech is normal and behavior is normal. Judgment and thought content normal. Cognition and memory are normal.    Results for orders placed or performed in visit on 12/23/14  Microscopic Examination  Result Value Ref Range   WBC, UA 0-5 0 -  5 /hpf   RBC, UA 0-2 0 -  2 /hpf   Epithelial Cells (non renal) 0-10 0 - 10 /hpf   Bacteria, UA Few None seen/Few  Comprehensive metabolic panel  Result Value Ref Range   Glucose 64 (L) 65 - 99 mg/dL   BUN 8 8 - 27 mg/dL   Creatinine, Ser  0.90 0.57 - 1.00 mg/dL   GFR calc non Af Amer 63 >59 mL/min/1.73   GFR calc Af Amer 72 >59 mL/min/1.73   BUN/Creatinine Ratio 9 (L) 11 - 26   Sodium 138 134 - 144 mmol/L   Potassium 4.7 3.5 - 5.2 mmol/L   Chloride 100 97 - 108 mmol/L   CO2 23 18 - 29 mmol/L   Calcium 9.1 8.7 - 10.3 mg/dL   Total Protein 6.4 6.0 - 8.5 g/dL   Albumin 4.0 3.5 - 4.8 g/dL   Globulin, Total 2.4 1.5 - 4.5 g/dL   Albumin/Globulin Ratio 1.7 1.1 - 2.5   Bilirubin Total 0.3 0.0 - 1.2 mg/dL   Alkaline Phosphatase 94 39 - 117 IU/L   AST 21 0 - 40 IU/L   ALT 12 0 - 32 IU/L  CBC with Differential/Platelet  Result Value Ref Range   WBC 6.4 3.4 - 10.8 x10E3/uL   RBC 4.77  3.77 - 5.28 x10E6/uL   Hemoglobin 12.8 11.1 - 15.9 g/dL   Hematocrit 39.8 34.0 - 46.6 %   MCV 83 79 - 97 fL   MCH 26.8 26.6 - 33.0 pg   MCHC 32.2 31.5 - 35.7 g/dL   RDW 14.6 12.3 - 15.4 %   Platelets 207 150 - 379 x10E3/uL   NEUTROPHILS 63 %   Lymphs 23 %   Monocytes 10 %   Eos 3 %   Basos 1 %   Neutrophils Absolute 4.1 1.4 - 7.0 x10E3/uL   Lymphocytes Absolute 1.5 0.7 - 3.1 x10E3/uL   Monocytes Absolute 0.6 0.1 - 0.9 x10E3/uL   EOS (ABSOLUTE) 0.2 0.0 - 0.4 x10E3/uL   Basophils Absolute 0.1 0.0 - 0.2 x10E3/uL   Immature Granulocytes 0 %   Immature Grans (Abs) 0.0 0.0 - 0.1 x10E3/uL  Urinalysis, Routine w reflex microscopic (not at [facility])  Result Value Ref Range   Specific Gravity, UA 1.010 1.005 - 1.030   pH, UA 6.0 5.0 - 7.5   Color, UA Yellow Yellow   Appearance Ur Clear Clear   Leukocytes, UA 1+ (A) Negative   Protein, UA Negative Negative/Trace   Glucose, UA Negative Negative   Ketones, UA Negative Negative   RBC, UA Negative Negative   Bilirubin, UA Negative Negative   Urobilinogen, Ur 0.2 0.2 - 1.0 mg/dL   Nitrite, UA Negative Negative   Microscopic Examination See below:   Lipid panel  Result Value Ref Range   Cholesterol, Total 162 100 - 199 mg/dL   Triglycerides 69 0 - 149 mg/dL   HDL 63 >39 mg/dL   VLDL Cholesterol Cal 14 5 - 40 mg/dL   LDL Calculated 85 0 - 99 mg/dL   Chol/HDL Ratio 2.6 0.0 - 4.4 ratio units  TSH  Result Value Ref Range   TSH 3.610 0.450 - 4.500 uIU/mL      Assessment & Plan:   Problem List Items Addressed This Visit    None    Visit Diagnoses    Ecchymosis    -  Primary    Appears to be a bruise on her breast without lump. Will have her ice or heat and return in 1 week. If still present, will obtain and ultrasound.         Follow up plan: Return in about 1 week (around 01/07/2015).

## 2014-12-31 NOTE — Patient Instructions (Signed)
Chest Contusion °A contusion is a deep bruise. Bruises happen when an injury causes bleeding under the skin. Signs of bruising include pain, puffiness (swelling), and discolored skin. The bruise may turn blue, purple, or yellow.  °HOME CARE °· Put ice on the injured area. °¨ Put ice in a plastic bag. °¨ Place a towel between the skin and the bag. °¨ Leave the ice on for 15-20 minutes at a time, 03-04 times a day for the first 48 hours. °· Only take medicine as told by your doctor. °· Rest. °· Take deep breaths (deep-breathing exercises) as told by your doctor. °· Stop smoking if you smoke. °· Do not lift objects over 5 pounds (2.3 kilograms) for 3 days or longer if told by your doctor. °GET HELP RIGHT AWAY IF:  °· You have more bruising or puffiness. °· You have pain that gets worse. °· You have trouble breathing. °· You are dizzy, weak, or pass out (faint). °· You have blood in your pee (urine) or poop (stool). °· You cough up or throw up (vomit) blood. °· Your puffiness or pain is not helped with medicines. °MAKE SURE YOU:  °· Understand these instructions. °· Will watch your condition. °· Will get help right away if you are not doing well or get worse. °Document Released: 12/06/2007 Document Revised: 03/13/2012 Document Reviewed: 12/11/2011 °[employer]® Patient Information ©2015 [employer]. This information is not intended to replace advice given to you by your health care provider. Make sure you discuss any questions you have with your health care provider. ° °

## 2015-01-07 ENCOUNTER — Ambulatory Visit: Payer: Commercial Managed Care - HMO | Admitting: Family Medicine

## 2015-01-15 DIAGNOSIS — I10 Essential (primary) hypertension: Secondary | ICD-10-CM | POA: Diagnosis not present

## 2015-01-15 DIAGNOSIS — E782 Mixed hyperlipidemia: Secondary | ICD-10-CM | POA: Diagnosis not present

## 2015-01-20 DIAGNOSIS — I714 Abdominal aortic aneurysm, without rupture: Secondary | ICD-10-CM | POA: Diagnosis not present

## 2015-01-20 DIAGNOSIS — I739 Peripheral vascular disease, unspecified: Secondary | ICD-10-CM | POA: Diagnosis not present

## 2015-01-20 DIAGNOSIS — I10 Essential (primary) hypertension: Secondary | ICD-10-CM | POA: Diagnosis not present

## 2015-01-20 DIAGNOSIS — F17200 Nicotine dependence, unspecified, uncomplicated: Secondary | ICD-10-CM | POA: Diagnosis not present

## 2015-01-20 DIAGNOSIS — I700 Atherosclerosis of aorta: Secondary | ICD-10-CM | POA: Diagnosis not present

## 2015-01-20 DIAGNOSIS — I701 Atherosclerosis of renal artery: Secondary | ICD-10-CM | POA: Diagnosis not present

## 2015-01-20 DIAGNOSIS — I639 Cerebral infarction, unspecified: Secondary | ICD-10-CM | POA: Diagnosis not present

## 2015-01-20 DIAGNOSIS — M79609 Pain in unspecified limb: Secondary | ICD-10-CM | POA: Diagnosis not present

## 2015-03-05 ENCOUNTER — Other Ambulatory Visit: Payer: Self-pay | Admitting: Family Medicine

## 2015-03-22 ENCOUNTER — Encounter: Payer: Self-pay | Admitting: Family Medicine

## 2015-03-22 ENCOUNTER — Ambulatory Visit (INDEPENDENT_AMBULATORY_CARE_PROVIDER_SITE_OTHER): Payer: Commercial Managed Care - HMO | Admitting: Family Medicine

## 2015-03-22 VITALS — BP 122/75 | HR 75 | Temp 98.2°F | Wt 96.4 lb

## 2015-03-22 DIAGNOSIS — L299 Pruritus, unspecified: Secondary | ICD-10-CM | POA: Diagnosis not present

## 2015-03-22 DIAGNOSIS — E871 Hypo-osmolality and hyponatremia: Secondary | ICD-10-CM

## 2015-03-22 DIAGNOSIS — I10 Essential (primary) hypertension: Secondary | ICD-10-CM | POA: Diagnosis not present

## 2015-03-22 DIAGNOSIS — F329 Major depressive disorder, single episode, unspecified: Secondary | ICD-10-CM | POA: Diagnosis not present

## 2015-03-22 DIAGNOSIS — F32A Depression, unspecified: Secondary | ICD-10-CM

## 2015-03-22 DIAGNOSIS — R42 Dizziness and giddiness: Secondary | ICD-10-CM

## 2015-03-22 NOTE — Assessment & Plan Note (Signed)
The current medical regimen is effective;  continue present plan and medications.  

## 2015-03-22 NOTE — Progress Notes (Signed)
BP 122/75 mmHg  Pulse 75  Temp(Src) 98.2 F (36.8 C)  Wt 96 lb 6.4 oz (43.727 kg)  SpO2 98%  LMP  (Within Years)   Subjective:    Patient ID: Melody Green, female    DOB: 28-Jul-1940, 74 y.o.   MRN: [id number]  HPI: Melody Green is a 74 y.o. female  Chief Complaint  Patient presents with  . Pruritis    upper chest and back area  . occasional pain in left breast and axila    also itches  . dizziness when coming in office    thinks it was from rushing out of rain  No longer dizzy feels fine  Itching but no rash for all summer tried lotions etc no help No pills Coco butter helps the most.  BP doing well Reviewed medications no new changes takes medications faithfully no welts or hives no association with taking medication. Depression nerves stable taking thyroid faithfully  Relevant past medical, surgical, family and social history reviewed and updated as indicated. Interim medical history since our last visit reviewed. Allergies and medications reviewed and updated.  Review of Systems  Constitutional: Negative.  Negative for fever and fatigue.  HENT:       Dizziness changes nothing new patient has from time to time  Respiratory: Negative.   Cardiovascular: Negative.     Per HPI unless specifically indicated above     Objective:    BP 122/75 mmHg  Pulse 75  Temp(Src) 98.2 F (36.8 C)  Wt 96 lb 6.4 oz (43.727 kg)  SpO2 98%  LMP  (Within Years)  Wt Readings from Last 3 Encounters:  03/22/15 96 lb 6.4 oz (43.727 kg)  12/31/14 98 lb 6.4 oz (44.634 kg)  12/23/14 96 lb 9.6 oz (43.817 kg)    Physical Exam  Constitutional: She is oriented to person, place, and time. She appears well-developed and well-nourished. No distress.  HENT:  Head: Normocephalic and atraumatic.  Right Ear: Hearing normal.  Left Ear: Hearing normal.  Nose: Nose normal.  Eyes: Conjunctivae and lids are normal. Right eye exhibits no discharge. Left eye exhibits no discharge. No scleral  icterus.  Cardiovascular: Normal rate, regular rhythm and normal heart sounds.   Pulmonary/Chest: Effort normal and breath sounds normal. No respiratory distress.  Musculoskeletal: Normal range of motion.  Neurological: She is alert and oriented to person, place, and time.  Skin: Skin is warm, dry and intact. No rash noted. No erythema.  No evidence of any rash or excoriations on the chest and back.  Psychiatric: She has a normal mood and affect. Her speech is normal and behavior is normal. Judgment and thought content normal. Cognition and memory are normal.    Results for orders placed or performed in visit on 12/23/14  Microscopic Examination  Result Value Ref Range   WBC, UA 0-5 0 -  5 /hpf   RBC, UA 0-2 0 -  2 /hpf   Epithelial Cells (non renal) 0-10 0 - 10 /hpf   Bacteria, UA Few None seen/Few  Comprehensive metabolic panel  Result Value Ref Range   Glucose 64 (L) 65 - 99 mg/dL   BUN 8 8 - 27 mg/dL   Creatinine, Ser 0.90 0.57 - 1.00 mg/dL   GFR calc non Af Amer 63 >59 mL/min/1.73   GFR calc Af Amer 72 >59 mL/min/1.73   BUN/Creatinine Ratio 9 (L) 11 - 26   Sodium 138 134 - 144 mmol/L   Potassium 4.7 3.5 -  5.2 mmol/L   Chloride 100 97 - 108 mmol/L   CO2 23 18 - 29 mmol/L   Calcium 9.1 8.7 - 10.3 mg/dL   Total Protein 6.4 6.0 - 8.5 g/dL   Albumin 4.0 3.5 - 4.8 g/dL   Globulin, Total 2.4 1.5 - 4.5 g/dL   Albumin/Globulin Ratio 1.7 1.1 - 2.5   Bilirubin Total 0.3 0.0 - 1.2 mg/dL   Alkaline Phosphatase 94 39 - 117 IU/L   AST 21 0 - 40 IU/L   ALT 12 0 - 32 IU/L  CBC with Differential/Platelet  Result Value Ref Range   WBC 6.4 3.4 - 10.8 x10E3/uL   RBC 4.77 3.77 - 5.28 x10E6/uL   Hemoglobin 12.8 11.1 - 15.9 g/dL   Hematocrit 39.8 34.0 - 46.6 %   MCV 83 79 - 97 fL   MCH 26.8 26.6 - 33.0 pg   MCHC 32.2 31.5 - 35.7 g/dL   RDW 14.6 12.3 - 15.4 %   Platelets 207 150 - 379 x10E3/uL   Neutrophils 63 %   Lymphs 23 %   Monocytes 10 %   Eos 3 %   Basos 1 %   Neutrophils  Absolute 4.1 1.4 - 7.0 x10E3/uL   Lymphocytes Absolute 1.5 0.7 - 3.1 x10E3/uL   Monocytes Absolute 0.6 0.1 - 0.9 x10E3/uL   EOS (ABSOLUTE) 0.2 0.0 - 0.4 x10E3/uL   Basophils Absolute 0.1 0.0 - 0.2 x10E3/uL   Immature Granulocytes 0 %   Immature Grans (Abs) 0.0 0.0 - 0.1 x10E3/uL  Urinalysis, Routine w reflex microscopic (not at [facility])  Result Value Ref Range   Specific Gravity, UA 1.010 1.005 - 1.030   pH, UA 6.0 5.0 - 7.5   Color, UA Yellow Yellow   Appearance Ur Clear Clear   Leukocytes, UA 1+ (A) Negative   Protein, UA Negative Negative/Trace   Glucose, UA Negative Negative   Ketones, UA Negative Negative   RBC, UA Negative Negative   Bilirubin, UA Negative Negative   Urobilinogen, Ur 0.2 0.2 - 1.0 mg/dL   Nitrite, UA Negative Negative   Microscopic Examination See below:   Lipid panel  Result Value Ref Range   Cholesterol, Total 162 100 - 199 mg/dL   Triglycerides 69 0 - 149 mg/dL   HDL 63 >39 mg/dL   VLDL Cholesterol Cal 14 5 - 40 mg/dL   LDL Calculated 85 0 - 99 mg/dL   Chol/HDL Ratio 2.6 0.0 - 4.4 ratio units  TSH  Result Value Ref Range   TSH 3.610 0.450 - 4.500 uIU/mL      Assessment & Plan:   Problem List Items Addressed This Visit      Cardiovascular and Mediastinum   HTN (hypertension)   Relevant Orders   Basic metabolic panel     Other   Dizziness - Primary    Stable dizziness with no exacerbations      Relevant Orders   Basic metabolic panel   Hyponatremia    We will check BMP again today      Relevant Orders   Basic metabolic panel   Depression    The current medical regimen is effective;  continue present plan and medications.        Other Visit Diagnoses    Itch        Will continue current cocoa butter is working well        Follow up plan: Return in about 3 months (around 06/21/2015), or if symptoms worsen or fail  to improve, for We'll check BMP, lipids, ALT, AST,.

## 2015-03-22 NOTE — Assessment & Plan Note (Signed)
Stable dizziness with no exacerbations

## 2015-03-22 NOTE — Assessment & Plan Note (Signed)
We will check BMP again today

## 2015-03-23 ENCOUNTER — Telehealth: Payer: Self-pay | Admitting: Family Medicine

## 2015-03-23 LAB — BASIC METABOLIC PANEL
BUN/Creatinine Ratio: 7 — ABNORMAL LOW (ref 11–26)
BUN: 7 mg/dL — ABNORMAL LOW (ref 8–27)
CO2: 24 mmol/L (ref 18–29)
CREATININE: 0.95 mg/dL (ref 0.57–1.00)
Calcium: 9 mg/dL (ref 8.7–10.3)
Chloride: 100 mmol/L (ref 97–108)
GFR calc non Af Amer: 59 mL/min/{1.73_m2} — ABNORMAL LOW (ref >59)
GFR, EST AFRICAN AMERICAN: 68 mL/min/{1.73_m2} (ref >59)
Glucose: 42 mg/dL — ABNORMAL LOW (ref 65–99)
POTASSIUM: 4.8 mmol/L (ref 3.5–5.2)
Sodium: 139 mmol/L (ref 134–144)

## 2015-03-23 NOTE — Telephone Encounter (Signed)
-----   Message from Wynn Maudlin, [place] sent at 03/23/2015  5:15 PM EDT ----- labs

## 2015-03-23 NOTE — Telephone Encounter (Signed)
Phone call Discussed with patient low glucose. Discussed with patient better diet nutrition to eat more frequently on a regular basis. Check BMP next office visit

## 2015-03-26 ENCOUNTER — Telehealth: Payer: Self-pay

## 2015-03-26 NOTE — Telephone Encounter (Signed)
Patient is still itching, please send something in to [facility]

## 2015-03-29 MED ORDER — TRIAMCINOLONE ACETONIDE 0.1 % EX CREA: 1.0000 "application " | TOPICAL_CREAM | Freq: Two times a day (BID) | CUTANEOUS | Status: DC

## 2015-04-02 ENCOUNTER — Other Ambulatory Visit: Payer: Self-pay | Admitting: Family Medicine

## 2015-05-03 ENCOUNTER — Encounter: Payer: Self-pay | Admitting: Family Medicine

## 2015-05-03 ENCOUNTER — Ambulatory Visit (INDEPENDENT_AMBULATORY_CARE_PROVIDER_SITE_OTHER): Payer: Commercial Managed Care - HMO | Admitting: Family Medicine

## 2015-05-03 VITALS — BP 98/64 | HR 80 | Temp 97.6°F | Ht 64.0 in | Wt 94.8 lb

## 2015-05-03 DIAGNOSIS — D692 Other nonthrombocytopenic purpura: Secondary | ICD-10-CM | POA: Diagnosis not present

## 2015-05-03 DIAGNOSIS — H81313 Aural vertigo, bilateral: Secondary | ICD-10-CM

## 2015-05-03 DIAGNOSIS — E039 Hypothyroidism, unspecified: Secondary | ICD-10-CM | POA: Insufficient documentation

## 2015-05-03 DIAGNOSIS — Z23 Encounter for immunization: Secondary | ICD-10-CM | POA: Diagnosis not present

## 2015-05-03 DIAGNOSIS — IMO0002 Reserved for concepts with insufficient information to code with codable children: Secondary | ICD-10-CM

## 2015-05-03 DIAGNOSIS — I10 Essential (primary) hypertension: Secondary | ICD-10-CM | POA: Diagnosis not present

## 2015-05-03 DIAGNOSIS — E46 Unspecified protein-calorie malnutrition: Secondary | ICD-10-CM | POA: Diagnosis not present

## 2015-05-03 NOTE — Assessment & Plan Note (Signed)
Discuss positional vertigo trial of eating better stopping blood pressure medications Checking lab work for low blood sugar If no response and still having vertigo issues will refer to ear nose and throat to further evaluate positional vertigo

## 2015-05-03 NOTE — Assessment & Plan Note (Signed)
Discussed role and nutrition green vegetables and vitamins

## 2015-05-03 NOTE — Progress Notes (Signed)
BP 98/64 mmHg  Pulse 80  Temp(Src) 97.6 F (36.4 C)  Ht '5\' 4"'$  (1.626 m)  Wt 94 lb 12.8 oz (43.001 kg)  BMI 16.26 kg/m2  SpO2 98%  LMP  (Within Years)   Subjective:    Patient ID: Melody Green, female    DOB: 23-Oct-1940, 74 y.o.   MRN: [id number]  HPI: Melody Green is a 74 y.o. female  Chief Complaint  Patient presents with  . Dizziness   patient with positional vertigo has been ongoing for little over a week now is pretty miserable with when lying down in bed or moving her head suddenly to the side she gets very dizzy because way after a few minutes. Patient has trouble standing when he comes on while moving. No Green nausea or vomiting. Hasn't had this problem before No issues with any of her medications which she takes faithfully no changes in medication On chart review of patient's labs since had low blood sugar and continues to lose weight. With 4 pound weight loss over the last 4 months. Patient relates cardiologist stopped lisinopril and started amlodipine 2.5 mg   Dizziness not related to mealtime  Relevant past medical, surgical, family and social history reviewed and updated as indicated. Interim medical history since our last visit reviewed. Allergies and medications reviewed and updated.  Review of Systems  Constitutional: Negative.   Respiratory: Negative.   Cardiovascular: Negative.     Per HPI unless specifically indicated above     Objective:    BP 98/64 mmHg  Pulse 80  Temp(Src) 97.6 F (36.4 C)  Ht '5\' 4"'$  (1.626 m)  Wt 94 lb 12.8 oz (43.001 kg)  BMI 16.26 kg/m2  SpO2 98%  LMP  (Within Years)  Wt Readings from Last 3 Encounters:  05/03/15 94 lb 12.8 oz (43.001 kg)  03/22/15 96 lb 6.4 oz (43.727 kg)  12/31/14 98 lb 6.4 oz (44.634 kg)    Physical Exam  Constitutional: She is oriented to person, place, and time. She appears well-developed and well-nourished. No distress.  HENT:  Head: Normocephalic and atraumatic.  Right Ear: Hearing and  external ear normal.  Left Ear: Hearing and external ear normal.  Nose: Nose normal.  Mouth/Throat: No oropharyngeal exudate.  Eyes: Conjunctivae, EOM and lids are normal. Pupils are equal, round, and reactive to light. Right eye exhibits no discharge. Left eye exhibits no discharge. No scleral icterus.  Neck: Normal range of motion. No thyromegaly present.  No bruits  Pulmonary/Chest: Effort normal. No respiratory distress.  Musculoskeletal: Normal range of motion.  Lymphadenopathy:    She has no cervical adenopathy.  Neurological: She is alert and oriented to person, place, and time.  Skin: Skin is intact. No rash noted.  Multiple petechia and bruising on arms  Psychiatric: She has a normal mood and affect. Her speech is normal and behavior is normal. Judgment and thought content normal. Cognition and memory are normal.    Results for orders placed or performed in visit on 17/61/60  Basic metabolic panel  Result Value Ref Range   Glucose 42 (L) 65 - 99 mg/dL   BUN 7 (L) 8 - 27 mg/dL   Creatinine, Ser 0.95 0.57 - 1.00 mg/dL   GFR calc non Af Amer 59 (L) >59 mL/min/1.73   GFR calc Af Amer 68 >59 mL/min/1.73   BUN/Creatinine Ratio 7 (L) 11 - 26   Sodium 139 134 - 144 mmol/L   Potassium 4.8 3.5 - 5.2 mmol/L  Chloride 100 97 - 108 mmol/L   CO2 24 18 - 29 mmol/L   Calcium 9.0 8.7 - 10.3 mg/dL      Assessment & Plan:   Problem List Items Addressed This Visit      Cardiovascular and Mediastinum   Purpura senilis ([place])    Discussed role and nutrition green vegetables and vitamins      Essential hypertension    Discussed low blood pressure may be contributing to patient's dizziness Will stop amlodipine observe blood pressure and dizziness        Endocrine   Hypothyroid     Other   Positional vertigo of both ears    Discuss positional vertigo trial of eating better stopping blood pressure medications Checking lab work for low blood sugar If no response and still having  vertigo issues will refer to ear nose and throat to further evaluate positional vertigo      Malnutrition ([place])    Patient with continued weight loss and malnutrition concerns May be contributing to dizziness Discussed eating better better nutrition and vitamins      Relevant Orders   Basic metabolic panel    Other Visit Diagnoses    Immunization due    -  Primary    Relevant Orders    Flu Vaccine QUAD 36+ mos PF IM (Fluarix & Fluzone Quad PF) (Completed)        Follow up plan: Return in about 4 weeks (around 05/31/2015), or if symptoms worsen or fail to improve, for Follow-up blood pressure and vertigo and weight.Marland Kitchen

## 2015-05-03 NOTE — Assessment & Plan Note (Addendum)
Patient with continued weight loss and malnutrition concerns May be contributing to dizziness Discussed eating better better nutrition and vitamins

## 2015-05-03 NOTE — Assessment & Plan Note (Signed)
Discussed low blood pressure may be contributing to patient's dizziness Will stop amlodipine observe blood pressure and dizziness

## 2015-05-04 ENCOUNTER — Encounter: Payer: Self-pay | Admitting: Family Medicine

## 2015-05-04 LAB — BASIC METABOLIC PANEL
BUN/Creatinine Ratio: 9 — ABNORMAL LOW (ref 11–26)
Creatinine, Ser: 0.82 mg/dL (ref 0.57–1.00)
GFR, EST AFRICAN AMERICAN: 81 mL/min/{1.73_m2} (ref >59)
GFR, EST NON AFRICAN AMERICAN: 70 mL/min/{1.73_m2} (ref >59)

## 2015-05-10 DIAGNOSIS — H6123 Impacted cerumen, bilateral: Secondary | ICD-10-CM | POA: Diagnosis not present

## 2015-05-10 DIAGNOSIS — R42 Dizziness and giddiness: Secondary | ICD-10-CM | POA: Diagnosis not present

## 2015-05-20 DIAGNOSIS — R42 Dizziness and giddiness: Secondary | ICD-10-CM | POA: Diagnosis not present

## 2015-06-04 ENCOUNTER — Other Ambulatory Visit: Payer: Self-pay | Admitting: Family Medicine

## 2015-06-07 NOTE — Telephone Encounter (Signed)
Patient stated that she still has not been able to get her LORazepam (ATIVAN) 1 MG tablet due to the pharmacy has not received anything on it. She stated she is out and needs refill, thanks.

## 2015-06-08 DIAGNOSIS — R42 Dizziness and giddiness: Secondary | ICD-10-CM | POA: Diagnosis not present

## 2015-06-08 NOTE — Telephone Encounter (Signed)
Call in rx  

## 2015-06-14 ENCOUNTER — Ambulatory Visit (INDEPENDENT_AMBULATORY_CARE_PROVIDER_SITE_OTHER): Payer: Commercial Managed Care - HMO | Admitting: Family Medicine

## 2015-06-14 ENCOUNTER — Encounter: Payer: Self-pay | Admitting: Family Medicine

## 2015-06-14 VITALS — BP 115/72 | HR 85 | Temp 98.5°F | Ht 63.3 in | Wt 90.6 lb

## 2015-06-14 DIAGNOSIS — R1084 Generalized abdominal pain: Secondary | ICD-10-CM

## 2015-06-14 DIAGNOSIS — E46 Unspecified protein-calorie malnutrition: Secondary | ICD-10-CM | POA: Diagnosis not present

## 2015-06-14 DIAGNOSIS — E785 Hyperlipidemia, unspecified: Secondary | ICD-10-CM | POA: Diagnosis not present

## 2015-06-14 DIAGNOSIS — R109 Unspecified abdominal pain: Secondary | ICD-10-CM | POA: Insufficient documentation

## 2015-06-14 DIAGNOSIS — I10 Essential (primary) hypertension: Secondary | ICD-10-CM

## 2015-06-14 LAB — LP+ALT+AST PICCOLO, WAIVED
ALT (SGPT) Piccolo, Waived: 16 U/L (ref 10–47)
AST (SGOT) PICCOLO, WAIVED: 28 U/L (ref 11–38)
CHOL/HDL RATIO PICCOLO,WAIVE: 2.7 mg/dL
CHOLESTEROL PICCOLO, WAIVED: 153 mg/dL (ref <200)
HDL CHOL PICCOLO, WAIVED: 57 mg/dL — AB (ref >59)
LDL CHOL CALC PICCOLO WAIVED: 74 mg/dL (ref <100)
Triglycerides Piccolo,Waived: 108 mg/dL (ref <150)
VLDL Chol Calc Piccolo,Waive: 22 mg/dL (ref <30)

## 2015-06-14 MED ORDER — TRIAMCINOLONE ACETONIDE 0.1 % EX CREA: TOPICAL_CREAM | Freq: Two times a day (BID) | CUTANEOUS | Status: DC

## 2015-06-14 MED ORDER — OMEPRAZOLE 20 MG PO CPDR: 20.0000 mg | DELAYED_RELEASE_CAPSULE | Freq: Every day | ORAL | Status: DC

## 2015-06-14 NOTE — Assessment & Plan Note (Signed)
The current medical regimen is effective;  continue present plan and medications.  

## 2015-06-14 NOTE — Progress Notes (Signed)
BP 115/72 mmHg  Pulse 85  Temp(Src) 98.5 F (36.9 C)  Ht 5' 3.3" (1.608 m)  Wt 90 lb 9.6 oz (41.096 kg)  BMI 15.89 kg/m2  SpO2 97%  LMP  (Within Years)   Subjective:    Patient ID: Melody Green, female    DOB: February 26, 1941, 74 y.o.   MRN: [id number]  HPI: Melody Green is a 74 y.o. female  Chief Complaint  Patient presents with  . Hyperlipidemia  . Hypertension   but pressure other medicines doing well without side effects did stop gabapentin because it seemed to be causing some swimmy headed .gabapentin and is helped. Did not do much for her leg ache anyway  Patient also with some abdominal pain that keeps her from eating as much, and on chart review patient has lost 4 pounds 6 pounds this fall Patient's tried multiple over-the-counter stuff Tums Rolaids seems to maybe help a little bit The only thing the patient has been able to eat without stomachache is ice cream. I reassured the patient that she wasn't eating enough ice cream  Relevant past medical, surgical, family and social history reviewed and updated as indicated. Interim medical history since our last visit reviewed. Allergies and medications reviewed and updated.  Review of Systems  Constitutional: Negative.   Respiratory: Negative.   Cardiovascular: Negative.     Per HPI unless specifically indicated above     Objective:    BP 115/72 mmHg  Pulse 85  Temp(Src) 98.5 F (36.9 C)  Ht 5' 3.3" (1.608 m)  Wt 90 lb 9.6 oz (41.096 kg)  BMI 15.89 kg/m2  SpO2 97%  LMP  (Within Years)  Wt Readings from Last 3 Encounters:  06/14/15 90 lb 9.6 oz (41.096 kg)  05/03/15 94 lb 12.8 oz (43.001 kg)  03/22/15 96 lb 6.4 oz (43.727 kg)    Physical Exam  Constitutional: She is oriented to person, place, and time. She appears well-developed and well-nourished. No distress.  HENT:  Head: Normocephalic and atraumatic.  Right Ear: Hearing normal.  Left Ear: Hearing normal.  Nose: Nose normal.  Eyes: Conjunctivae and lids  are normal. Right eye exhibits no discharge. Left eye exhibits no discharge. No scleral icterus.  Cardiovascular: Normal rate, regular rhythm and normal heart sounds.   Pulmonary/Chest: Effort normal and breath sounds normal. No respiratory distress.  Abdominal: Soft. Bowel sounds are normal. She exhibits no distension. There is no tenderness.  Musculoskeletal: Normal range of motion.  Neurological: She is alert and oriented to person, place, and time.  Skin: Skin is dry and intact. No rash noted.  Psychiatric: She has a normal mood and affect. Her speech is normal and behavior is normal. Judgment and thought content normal. Cognition and memory are normal.    Results for orders placed or performed in visit on 74/08/14  Basic metabolic panel  Result Value Ref Range   Creatinine, Ser 0.82 0.57 - 1.00 mg/dL   GFR calc non Af Amer 70 >59 mL/min/1.73   GFR calc Af Amer 81 >59 mL/min/1.73   BUN/Creatinine Ratio 9 (L) 11 - 26      Assessment & Plan:   Problem List Items Addressed This Visit      Cardiovascular and Mediastinum   Essential hypertension    The current medical regimen is effective;  continue present plan and medications.         Other   Malnutrition ([place])    Discussed nutrition and health will do better with  eating      Abdominal pain    Patient for abdominal pain associated with eating will try Prilosec      Relevant Medications   omeprazole (PRILOSEC) 20 MG capsule    Other Visit Diagnoses    Hyperlipemia    -  Primary    Relevant Orders    LP+ALT+AST [place], Waived    Basic metabolic panel        Follow up plan: Return in about 3 months (around 09/12/2015) for 3 months weight check and BMP.

## 2015-06-14 NOTE — Assessment & Plan Note (Signed)
Discussed nutrition and health will do better with eating

## 2015-06-14 NOTE — Assessment & Plan Note (Signed)
Patient for abdominal pain associated with eating will try Prilosec

## 2015-06-15 LAB — BASIC METABOLIC PANEL
BUN/Creatinine Ratio: 11 (ref 11–26)
BUN: 12 mg/dL (ref 8–27)
CALCIUM: 9.2 mg/dL (ref 8.7–10.3)
CO2: 23 mmol/L (ref 18–29)
CREATININE: 1.08 mg/dL — AB (ref 0.57–1.00)
Chloride: 100 mmol/L (ref 96–106)
GFR calc Af Amer: 58 mL/min/{1.73_m2} — ABNORMAL LOW (ref >59)
GFR, EST NON AFRICAN AMERICAN: 50 mL/min/{1.73_m2} — AB (ref >59)
Glucose: 78 mg/dL (ref 65–99)
POTASSIUM: 4.4 mmol/L (ref 3.5–5.2)
Sodium: 142 mmol/L (ref 134–144)

## 2015-06-21 DIAGNOSIS — G5793 Unspecified mononeuropathy of bilateral lower limbs: Secondary | ICD-10-CM | POA: Diagnosis not present

## 2015-06-21 DIAGNOSIS — E785 Hyperlipidemia, unspecified: Secondary | ICD-10-CM | POA: Diagnosis not present

## 2015-06-21 DIAGNOSIS — I700 Atherosclerosis of aorta: Secondary | ICD-10-CM | POA: Diagnosis not present

## 2015-06-21 DIAGNOSIS — Z0000 Encounter for general adult medical examination without abnormal findings: Secondary | ICD-10-CM | POA: Diagnosis not present

## 2015-06-21 DIAGNOSIS — E039 Hypothyroidism, unspecified: Secondary | ICD-10-CM | POA: Diagnosis not present

## 2015-06-21 DIAGNOSIS — I719 Aortic aneurysm of unspecified site, without rupture: Secondary | ICD-10-CM | POA: Diagnosis not present

## 2015-06-21 DIAGNOSIS — E640 Sequelae of protein-calorie malnutrition: Secondary | ICD-10-CM | POA: Diagnosis not present

## 2015-06-21 DIAGNOSIS — F324 Major depressive disorder, single episode, in partial remission: Secondary | ICD-10-CM | POA: Diagnosis not present

## 2015-07-19 ENCOUNTER — Encounter: Payer: Self-pay | Admitting: Emergency Medicine

## 2015-07-19 ENCOUNTER — Emergency Department
Admission: EM | Admit: 2015-07-19 | Discharge: 2015-07-19 | Disposition: A | Discharge status: Home / Self Care | Payer: Commercial Managed Care - HMO | Attending: Emergency Medicine | Admitting: Emergency Medicine

## 2015-07-19 ENCOUNTER — Emergency Department: Payer: Commercial Managed Care - HMO

## 2015-07-19 DIAGNOSIS — Z7951 Long term (current) use of inhaled steroids: Secondary | ICD-10-CM | POA: Diagnosis not present

## 2015-07-19 DIAGNOSIS — R1013 Epigastric pain: Secondary | ICD-10-CM | POA: Diagnosis not present

## 2015-07-19 DIAGNOSIS — Z7902 Long term (current) use of antithrombotics/antiplatelets: Secondary | ICD-10-CM | POA: Diagnosis not present

## 2015-07-19 DIAGNOSIS — Z79899 Other long term (current) drug therapy: Secondary | ICD-10-CM | POA: Insufficient documentation

## 2015-07-19 DIAGNOSIS — I129 Hypertensive chronic kidney disease with stage 1 through stage 4 chronic kidney disease, or unspecified chronic kidney disease: Secondary | ICD-10-CM | POA: Diagnosis not present

## 2015-07-19 DIAGNOSIS — R1010 Upper abdominal pain, unspecified: Secondary | ICD-10-CM | POA: Diagnosis present

## 2015-07-19 DIAGNOSIS — K529 Noninfective gastroenteritis and colitis, unspecified: Secondary | ICD-10-CM | POA: Diagnosis not present

## 2015-07-19 DIAGNOSIS — R1084 Generalized abdominal pain: Secondary | ICD-10-CM

## 2015-07-19 DIAGNOSIS — Z7952 Long term (current) use of systemic steroids: Secondary | ICD-10-CM | POA: Diagnosis not present

## 2015-07-19 DIAGNOSIS — F17210 Nicotine dependence, cigarettes, uncomplicated: Secondary | ICD-10-CM | POA: Diagnosis not present

## 2015-07-19 DIAGNOSIS — R112 Nausea with vomiting, unspecified: Secondary | ICD-10-CM | POA: Diagnosis not present

## 2015-07-19 DIAGNOSIS — N189 Chronic kidney disease, unspecified: Secondary | ICD-10-CM | POA: Insufficient documentation

## 2015-07-19 LAB — COMPREHENSIVE METABOLIC PANEL
ALBUMIN: 3.9 g/dL (ref 3.5–5.0)
ALK PHOS: 58 U/L (ref 38–126)
ALT: 16 U/L (ref 14–54)
AST: 27 U/L (ref 15–41)
Anion gap: 9 (ref 5–15)
BUN: 10 mg/dL (ref 6–20)
CALCIUM: 9 mg/dL (ref 8.9–10.3)
CO2: 26 mmol/L (ref 22–32)
CREATININE: 0.98 mg/dL (ref 0.44–1.00)
Chloride: 101 mmol/L (ref 101–111)
GFR calc Af Amer: >60 mL/min (ref >60)
GFR calc non Af Amer: 55 mL/min — ABNORMAL LOW (ref >60)
GLUCOSE: 104 mg/dL — AB (ref 65–99)
Potassium: 3.7 mmol/L (ref 3.5–5.1)
SODIUM: 136 mmol/L (ref 135–145)
Total Bilirubin: 0.2 mg/dL — ABNORMAL LOW (ref 0.3–1.2)
Total Protein: 6.8 g/dL (ref 6.5–8.1)

## 2015-07-19 LAB — URINALYSIS COMPLETE WITH MICROSCOPIC (ARMC ONLY)
BILIRUBIN URINE: NEGATIVE
Glucose, UA: NEGATIVE mg/dL
Hgb urine dipstick: NEGATIVE
Nitrite: NEGATIVE
PH: 5 (ref 5.0–8.0)
Protein, ur: NEGATIVE mg/dL
Specific Gravity, Urine: 1.023 (ref 1.005–1.030)

## 2015-07-19 LAB — CBC
HCT: 41.8 % (ref 35.0–47.0)
HEMOGLOBIN: 13.7 g/dL (ref 12.0–16.0)
MCH: 28.9 pg (ref 26.0–34.0)
MCHC: 32.8 g/dL (ref 32.0–36.0)
MCV: 88.2 fL (ref 80.0–100.0)
PLATELETS: 187 10*3/uL (ref 150–440)
RBC: 4.74 MIL/uL (ref 3.80–5.20)
RDW: 14.8 % — ABNORMAL HIGH (ref 11.5–14.5)
WBC: 7.3 10*3/uL (ref 3.6–11.0)

## 2015-07-19 LAB — LIPASE, BLOOD: Lipase: 42 U/L (ref 11–51)

## 2015-07-19 MED ORDER — IBUPROFEN 200 MG PO TABS: 400.0000 mg | ORAL_TABLET | Freq: Four times a day (QID) | ORAL | Status: DC | PRN

## 2015-07-19 MED ORDER — IOHEXOL 300 MG/ML  SOLN
75.0000 mL | Freq: Once | INTRAMUSCULAR | Status: AC | PRN
  Administered 2015-07-19: 75 mL via INTRAVENOUS

## 2015-07-19 MED ORDER — ONDANSETRON HCL 4 MG/2ML IJ SOLN
4.0000 mg | Freq: Once | INTRAMUSCULAR | Status: AC
  Administered 2015-07-19: 4 mg via INTRAVENOUS
  Filled 2015-07-19: qty 2

## 2015-07-19 MED ORDER — FENTANYL CITRATE (PF) 100 MCG/2ML IJ SOLN
50.0000 ug | Freq: Once | INTRAMUSCULAR | Status: AC
  Administered 2015-07-19: 50 ug via INTRAVENOUS
  Filled 2015-07-19: qty 2

## 2015-07-19 MED ORDER — IOHEXOL 240 MG/ML SOLN
50.0000 mL | INTRAMUSCULAR | Status: AC
  Administered 2015-07-19: 25 mL via ORAL

## 2015-07-19 MED ORDER — ONDANSETRON 4 MG PO TBDP: 4.0000 mg | ORAL_TABLET | Freq: Three times a day (TID) | ORAL | Status: DC | PRN

## 2015-07-19 MED ORDER — SODIUM CHLORIDE 0.9 % IV BOLUS (SEPSIS)
1000.0000 mL | Freq: Once | INTRAVENOUS | Status: AC
  Administered 2015-07-19: 1000 mL via INTRAVENOUS

## 2015-07-19 NOTE — Discharge Instructions (Signed)
As we discussed please follow up with her primary care physician in one to 2 days for recheck. Return to the emergency department if he continued to have abdominal pain in 24 hours, or sooner if the abdominal pain worsens you develop a fever, or any other symptom personally concerning to yourself.   Abdominal Pain, Adult Many things can cause abdominal pain. Usually, abdominal pain is not caused by a disease and will improve without treatment. It can often be observed and treated at home. Your health care provider will do a physical exam and possibly order blood tests and X-rays to help determine the seriousness of your pain. However, in many cases, more time must pass before a clear cause of the pain can be found. Before that point, your health care provider may not know if you need more testing or further treatment. HOME CARE INSTRUCTIONS Monitor your abdominal pain for any changes. The following actions may help to alleviate any discomfort you are experiencing:  Only take over-the-counter or prescription medicines as directed by your health care provider.  Do not take laxatives unless directed to do so by your health care provider.  Try a clear liquid diet (broth, tea, or water) as directed by your health care provider. Slowly move to a bland diet as tolerated. SEEK MEDICAL CARE IF:  You have unexplained abdominal pain.  You have abdominal pain associated with nausea or diarrhea.  You have pain when you urinate or have a bowel movement.  You experience abdominal pain that wakes you in the night.  You have abdominal pain that is worsened or improved by eating food.  You have abdominal pain that is worsened with eating fatty foods.  You have a fever. SEEK IMMEDIATE MEDICAL CARE IF:  Your pain does not go away within 2 hours.  You keep throwing up (vomiting).  Your pain is felt only in portions of the abdomen, such as the right side or the left lower portion of the abdomen.  You  pass bloody or black tarry stools. MAKE SURE YOU:  Understand these instructions.  Will watch your condition.  Will get help right away if you are not doing well or get worse.   This information is not intended to replace advice given to you by your health care provider. Make sure you discuss any questions you have with your health care provider.   Document Released: 03/29/2005 Document Revised: 03/10/2015 Document Reviewed: 02/26/2013 Elsevier Interactive Patient Education [employer].

## 2015-07-19 NOTE — ED Notes (Signed)
Pt presents with abd pain started this am with no n/v/d.

## 2015-07-19 NOTE — ED Provider Notes (Signed)
[facility] Emergency Department Provider Note  Time seen: 5:05 PM  I have reviewed the triage vital signs and the nursing notes.   HISTORY  Chief Complaint Abdominal Pain    HPI Melody Green is a 75 y.o. female with a past medical history of chronic kidney disease, CVA, depression, gastric reflux, who presents to the emergency department feeling nauseated with diffuse abdominal discomfort. According to patient beginning this morning she has been experiencing diffuse abdominal discomfort which she states is now more localized to her mid upper abdomen. She has become nauseated and vomiting in the emergency department. Denies fever, cough, congestion, dysuria. Denies diarrhea black or bloody stool. Normal bowel movement this morning. Patient describes her abdominal pain is moderate, dull/aching. Describes her nausea is moderate.     Past Medical History  Diagnosis Date  . Depression   . DDD (degenerative disc disease), cervical   . Osteoporosis   . Hernia of abdominal cavity   . Vitamin B 12 deficiency   . Collagenous colitis   . Hyperlipidemia   . Chronic kidney disease   . Urine incontinence   . Tobacco abuse   . Stroke [facility])     Patient Active Problem List   Diagnosis Date Noted  . Abdominal pain 06/14/2015  . Malnutrition ([place]) 05/03/2015  . Hypothyroid 05/03/2015  . 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 Positional vertigo of both ears 05/03/2015  . Depression 12/23/2014  . Hyperlipidemia 12/23/2014  . Essential hypertension 12/04/2014  . Dizziness 12/04/2014  . Hyponatremia 12/04/2014    Past Surgical History  Procedure Laterality Date  . Abdominal hysterectomy    . Abdominal aortic aneurysm repair    . Cholecystectomy    . Back surgery  [id number]  . Appendectomy      Current Outpatient Rx  Name  Route  Sig  Dispense  Refill  . atorvastatin (LIPITOR) 20 MG tablet   Oral   Take 1 tablet (20 mg total) by mouth daily.   90 tablet    4   . buPROPion (WELLBUTRIN SR) 150 MG 12 hr tablet   Oral   Take 1 tablet (150 mg total) by mouth 2 (two) times daily. Patient not taking: Reported on 05/03/2015   180 tablet   4   . calcium carbonate (OS-CAL) 600 MG TABS tablet   Oral   Take 600 mg by mouth daily.         . citalopram (CELEXA) 10 MG tablet   Oral   Take 1 tablet (10 mg total) by mouth daily.   90 tablet   4   . clopidogrel (PLAVIX) 75 MG tablet      TAKE 1 TABLET ONE TIME DAILY   90 tablet   4   . cyanocobalamin (,VITAMIN B-12,) 1000 MCG/ML injection      INJECT 1 ML ONCE A MONTH   10 mL   3   . levothyroxine (SYNTHROID, LEVOTHROID) 50 MCG tablet   Oral   Take 1 tablet (50 mcg total) by mouth daily. Alternate with 79mg pill   45 tablet   4   . levothyroxine (SYNTHROID, LEVOTHROID) 75 MCG tablet   Oral   Take 1 tablet (75 mcg total) by mouth daily before breakfast. Alternate with 562m pill   45 tablet   4   . LORazepam (ATIVAN) 1 MG tablet      TAKE 1 TO 2 TABLETS EVERY DAY AS NEEDED   120 tablet   1   .  mometasone (NASONEX) 50 MCG/ACT nasal spray   Nasal   Place 2 sprays into the nose daily.         Marland Kitchen omeprazole (PRILOSEC) 20 MG capsule   Oral   Take 1 capsule (20 mg total) by mouth daily.   30 capsule   3   . triamcinolone cream (KENALOG) 0.1 %   Topical   Apply topically 2 (two) times daily.   45 g   2     Allergies Codeine; Morphine and related; and Ultram  Family History  Problem Relation Age of Onset  . Diabetes Mother   . Diabetes Father     Social History Social History  Substance Use Topics  . Smoking status: Current Every Day Smoker -- 0.25 packs/day    Types: Cigarettes  . Smokeless tobacco: Never Used  . Alcohol Use: No    Review of Systems Constitutional: Negative for fever. Cardiovascular: Negative for chest pain. Respiratory: Negative for shortness of breath. Gastrointestinal: Positive for abdominal pain, nausea and vomiting. Negative for  diarrhea. Normal bowel movements. Genitourinary: Negative for dysuria. Neurological: Negative for headache 10-point ROS otherwise negative.  ____________________________________________   PHYSICAL EXAM:  VITAL SIGNS: ED Triage Vitals  Enc Vitals Group     BP 07/19/15 1411 149/89 mmHg     Pulse Rate 07/19/15 1411 77     Resp 07/19/15 1411 18     Temp 07/19/15 1411 97.7 F (36.5 C)     Temp Source 07/19/15 1411 Oral     SpO2 07/19/15 1411 96 %     Weight 07/19/15 1411 90 lb (40.824 kg)     Height 07/19/15 1411 '5\' 4"'$  (1.626 m)     Head Cir --      Peak Flow --      Pain Score 07/19/15 1420 10     Pain Loc --      Pain Edu? --      Excl. in [place]? --     Constitutional: Alert and oriented. Well appearing and in no distress. Eyes: Normal exam ENT   Head: Normocephalic and atraumatic.   Mouth/Throat: Mucous membranes are moist. Cardiovascular: Normal rate, regular rhythm. No murmur Respiratory: Normal respiratory effort without tachypnea nor retractions. Breath sounds are clear Gastrointestinal: , Moderate lower and periumbilical tenderness to palpation. No epigastric tenderness. No rebound or guarding. No distention. Normal bowel sounds. Musculoskeletal: Nontender with normal range of motion in all extremities. Neurologic:  Normal speech and language. No gross focal neurologic deficits  Skin:  Skin is warm, dry and intact.  Psychiatric: Mood and affect are normal. Speech and behavior are normal.  ____________________________________________    EKG  EKG reviewed and interpreted by myself shows normal sinus rhythm at 79 bpm, narrow QRS, normal axis, normal intervals and nonspecific ST changes. No ST elevations.  ____________________________________________    RADIOLOGY  CT most consistent with enteritis.  ____________________________________________   INITIAL IMPRESSION / ASSESSMENT AND PLAN / ED COURSE  Pertinent labs & imaging results that were available  during my care of the patient were reviewed by me and considered in my medical decision making (see chart for details).  Patient presents with abdominal pain, nausea and vomiting. Patient's labs are within normal limits, urinalysis pending. Given her moderate tenderness to palpation we'll proceed with a CT abdomen/pelvis to further evaluate. We will treat the patient's discomfort nausea and IV hydrate awaiting CT results.  The most consistent with enteritis. I discussed with the patient she strongly wishes to go  home. States the pain is much better at this time but still comes and goes in waves. We'll prescribe the patient pain medication and nausea medication to be used as needed. I discussed with the patient that if she continues to have pain in 24 hours she is to return to the emergency department for reevaluation, the patient is agreeable otherwise she'll follow-up with a primary care physician.  ____________________________________________   FINAL CLINICAL IMPRESSION(S) / ED DIAGNOSES  Abdominal pain Nausea and vomiting  Harvest Dark, MD 07/19/15 2007

## 2015-07-19 NOTE — ED Notes (Signed)
Patient transported to CT 

## 2015-07-23 ENCOUNTER — Inpatient Hospital Stay
Admission: EM | Admit: 2015-07-23 | Discharge: 2015-07-27 | DRG: 388 | Disposition: A | Discharge status: Home / Self Care | Payer: Commercial Managed Care - HMO | Attending: Surgery | Admitting: Surgery

## 2015-07-23 ENCOUNTER — Encounter: Payer: Self-pay | Admitting: Emergency Medicine

## 2015-07-23 ENCOUNTER — Emergency Department: Payer: Commercial Managed Care - HMO

## 2015-07-23 DIAGNOSIS — R1110 Vomiting, unspecified: Secondary | ICD-10-CM | POA: Diagnosis not present

## 2015-07-23 DIAGNOSIS — Z9889 Other specified postprocedural states: Secondary | ICD-10-CM

## 2015-07-23 DIAGNOSIS — N183 Chronic kidney disease, stage 3 (moderate): Secondary | ICD-10-CM | POA: Diagnosis not present

## 2015-07-23 DIAGNOSIS — I129 Hypertensive chronic kidney disease with stage 1 through stage 4 chronic kidney disease, or unspecified chronic kidney disease: Secondary | ICD-10-CM | POA: Diagnosis present

## 2015-07-23 DIAGNOSIS — K5660 Unspecified intestinal obstruction: Secondary | ICD-10-CM | POA: Diagnosis not present

## 2015-07-23 DIAGNOSIS — M810 Age-related osteoporosis without current pathological fracture: Secondary | ICD-10-CM | POA: Diagnosis present

## 2015-07-23 DIAGNOSIS — Z90710 Acquired absence of both cervix and uterus: Secondary | ICD-10-CM | POA: Diagnosis not present

## 2015-07-23 DIAGNOSIS — Z8673 Personal history of transient ischemic attack (TIA), and cerebral infarction without residual deficits: Secondary | ICD-10-CM | POA: Diagnosis not present

## 2015-07-23 DIAGNOSIS — E43 Unspecified severe protein-calorie malnutrition: Secondary | ICD-10-CM | POA: Insufficient documentation

## 2015-07-23 DIAGNOSIS — Z886 Allergy status to analgesic agent status: Secondary | ICD-10-CM | POA: Diagnosis not present

## 2015-07-23 DIAGNOSIS — R140 Abdominal distension (gaseous): Secondary | ICD-10-CM | POA: Diagnosis not present

## 2015-07-23 DIAGNOSIS — E871 Hypo-osmolality and hyponatremia: Secondary | ICD-10-CM | POA: Diagnosis not present

## 2015-07-23 DIAGNOSIS — Z888 Allergy status to other drugs, medicaments and biological substances status: Secondary | ICD-10-CM | POA: Diagnosis not present

## 2015-07-23 DIAGNOSIS — F17210 Nicotine dependence, cigarettes, uncomplicated: Secondary | ICD-10-CM | POA: Diagnosis present

## 2015-07-23 DIAGNOSIS — Z9049 Acquired absence of other specified parts of digestive tract: Secondary | ICD-10-CM

## 2015-07-23 DIAGNOSIS — K5669 Other intestinal obstruction: Secondary | ICD-10-CM | POA: Diagnosis not present

## 2015-07-23 DIAGNOSIS — K565 Intestinal adhesions [bands] with obstruction (postprocedural) (postinfection): Secondary | ICD-10-CM | POA: Diagnosis not present

## 2015-07-23 DIAGNOSIS — Z885 Allergy status to narcotic agent status: Secondary | ICD-10-CM

## 2015-07-23 DIAGNOSIS — R112 Nausea with vomiting, unspecified: Secondary | ICD-10-CM | POA: Diagnosis not present

## 2015-07-23 DIAGNOSIS — R000 Tachycardia, unspecified: Secondary | ICD-10-CM | POA: Diagnosis not present

## 2015-07-23 DIAGNOSIS — N179 Acute kidney failure, unspecified: Secondary | ICD-10-CM | POA: Diagnosis present

## 2015-07-23 DIAGNOSIS — Z8679 Personal history of other diseases of the circulatory system: Secondary | ICD-10-CM | POA: Diagnosis not present

## 2015-07-23 DIAGNOSIS — E860 Dehydration: Secondary | ICD-10-CM | POA: Diagnosis not present

## 2015-07-23 DIAGNOSIS — E861 Hypovolemia: Secondary | ICD-10-CM | POA: Diagnosis not present

## 2015-07-23 DIAGNOSIS — Z681 Body mass index (BMI) 19 or less, adult: Secondary | ICD-10-CM

## 2015-07-23 DIAGNOSIS — E876 Hypokalemia: Secondary | ICD-10-CM | POA: Diagnosis present

## 2015-07-23 DIAGNOSIS — K56609 Unspecified intestinal obstruction, unspecified as to partial versus complete obstruction: Secondary | ICD-10-CM | POA: Diagnosis present

## 2015-07-23 DIAGNOSIS — Z833 Family history of diabetes mellitus: Secondary | ICD-10-CM | POA: Diagnosis not present

## 2015-07-23 DIAGNOSIS — Z8719 Personal history of other diseases of the digestive system: Secondary | ICD-10-CM | POA: Insufficient documentation

## 2015-07-23 LAB — COMPREHENSIVE METABOLIC PANEL
ALK PHOS: 56 U/L (ref 38–126)
ALT: 12 U/L — AB (ref 14–54)
AST: 26 U/L (ref 15–41)
Albumin: 4.2 g/dL (ref 3.5–5.0)
Anion gap: 18 — ABNORMAL HIGH (ref 5–15)
BUN: 64 mg/dL — ABNORMAL HIGH (ref 6–20)
CALCIUM: 9.2 mg/dL (ref 8.9–10.3)
CHLORIDE: 94 mmol/L — AB (ref 101–111)
CO2: 20 mmol/L — AB (ref 22–32)
Creatinine, Ser: 5.66 mg/dL — ABNORMAL HIGH (ref 0.44–1.00)
GFR calc Af Amer: 8 mL/min — ABNORMAL LOW (ref >60)
GFR, EST NON AFRICAN AMERICAN: 7 mL/min — AB (ref >60)
Glucose, Bld: 86 mg/dL (ref 65–99)
Potassium: 5.4 mmol/L — ABNORMAL HIGH (ref 3.5–5.1)
Sodium: 132 mmol/L — ABNORMAL LOW (ref 135–145)
TOTAL PROTEIN: 7.7 g/dL (ref 6.5–8.1)
Total Bilirubin: 1 mg/dL (ref 0.3–1.2)

## 2015-07-23 LAB — LIPASE, BLOOD: LIPASE: 42 U/L (ref 11–51)

## 2015-07-23 LAB — URINALYSIS COMPLETE WITH MICROSCOPIC (ARMC ONLY)
Bacteria, UA: NONE SEEN
Bilirubin Urine: NEGATIVE
Glucose, UA: 50 mg/dL — AB
Leukocytes, UA: NEGATIVE
NITRITE: NEGATIVE
PROTEIN: 100 mg/dL — AB
SPECIFIC GRAVITY, URINE: 1.014 (ref 1.005–1.030)
pH: 5 (ref 5.0–8.0)

## 2015-07-23 LAB — CBC
HCT: 45.1 % (ref 35.0–47.0)
HEMOGLOBIN: 14.6 g/dL (ref 12.0–16.0)
MCH: 28.1 pg (ref 26.0–34.0)
MCHC: 32.4 g/dL (ref 32.0–36.0)
MCV: 86.7 fL (ref 80.0–100.0)
Platelets: 196 10*3/uL (ref 150–440)
RBC: 5.2 MIL/uL (ref 3.80–5.20)
RDW: 15.1 % — ABNORMAL HIGH (ref 11.5–14.5)
WBC: 3.3 10*3/uL — ABNORMAL LOW (ref 3.6–11.0)

## 2015-07-23 LAB — TROPONIN I: TROPONIN I: 0.04 ng/mL — AB (ref <0.031)

## 2015-07-23 MED ORDER — DEXTROSE-NACL 5-0.45 % IV SOLN
INTRAVENOUS | Status: DC
  Administered 2015-07-23: via INTRAVENOUS
  Administered 2015-07-23: 150 mL/h via INTRAVENOUS
  Administered 2015-07-24: 06:00:00 via INTRAVENOUS

## 2015-07-23 MED ORDER — HYDROMORPHONE HCL 1 MG/ML IJ SOLN: 1.0000 mg | INTRAMUSCULAR | Status: DC | PRN

## 2015-07-23 MED ORDER — ONDANSETRON HCL 4 MG/2ML IJ SOLN: 4.0000 mg | Freq: Four times a day (QID) | INTRAMUSCULAR | Status: DC | PRN

## 2015-07-23 MED ORDER — ONDANSETRON 4 MG PO TBDP: 4.0000 mg | ORAL_TABLET | Freq: Four times a day (QID) | ORAL | Status: DC | PRN

## 2015-07-23 MED ORDER — ACETAMINOPHEN 650 MG RE SUPP: 650.0000 mg | Freq: Four times a day (QID) | RECTAL | Status: DC | PRN

## 2015-07-23 MED ORDER — SODIUM CHLORIDE 0.9 % IV BOLUS (SEPSIS): 1000.0000 mL | Freq: Once | INTRAVENOUS | Status: DC

## 2015-07-23 MED ORDER — SODIUM CHLORIDE 0.9 % IV BOLUS (SEPSIS)
1000.0000 mL | Freq: Once | INTRAVENOUS | Status: AC
  Administered 2015-07-23: 1000 mL via INTRAVENOUS

## 2015-07-23 MED ORDER — ENOXAPARIN SODIUM 40 MG/0.4ML ~~LOC~~ SOLN
40.0000 mg | SUBCUTANEOUS | Status: DC
  Administered 2015-07-23: 40 mg via SUBCUTANEOUS
  Filled 2015-07-23 (×2): qty 0.4

## 2015-07-23 MED ORDER — PANTOPRAZOLE SODIUM 40 MG IV SOLR
40.0000 mg | Freq: Every day | INTRAVENOUS | Status: DC
  Administered 2015-07-23 – 2015-07-26 (×4): 40 mg via INTRAVENOUS
  Filled 2015-07-23 (×4): qty 40

## 2015-07-23 MED ORDER — DIAZEPAM 5 MG/ML IJ SOLN: 1.0000 mg | Freq: Once | INTRAMUSCULAR | Status: DC

## 2015-07-23 MED ORDER — ACETAMINOPHEN 325 MG PO TABS: 650.0000 mg | ORAL_TABLET | Freq: Four times a day (QID) | ORAL | Status: DC | PRN

## 2015-07-23 NOTE — ED Notes (Signed)
Patient states that she was seen on Monday here in the ED for abdominal pain, Patient states that she is not having pain right now but every time she eats she throws it back up. Patient states that she is unable to keep liquids down as well.  Patient denies any blood in vomit.

## 2015-07-23 NOTE — H&P (Signed)
Melody Green is a 75 y.o. female  with several days of abdominal pain nausea and vomiting.  HPI: She presents to the emergency room with a week to 10 days of abdominal pain and intermittent nausea and vomiting and general lethargy. She has been declining over the last several days. She noted generalized mid abdominal discomfort followed by some profound nausea. Her pain came in waves was continued by significant vomiting. She was evaluated in the emergency room on January 16 and CT scan that time suggested dilatated small bowel consistent with enteritis. She was discharged home to follow up with her primary care doctor. However her doctor was not available and she presented back to the emergency room as her symptoms did not improve.  She denies any fever or chills. She's had difficulty with constipation and is passing gas only intermittently. She's not had a bowel movement in 5 days. She has had multiple previous abdominal surgeries including cholecystectomy appendectomy hysterectomy and abdominal aortic aneurysm repair. She denies any history of hepatitis yellow jaundice pancreatitis peptic ulcer disease or diverticulitis. She has not had a recent colonoscopy.  Workup in the emergency room revealed creatinine of 5.6 with some ulcerations in metabolic panel suggestive of mild dehydration. Her creatinine at time of evaluation on the 16th was less than 1. She does carry a diagnosis of chronic renal insufficiency. CT scan was referred appears to be a high-grade obstruction with a and adhesion in the lower left pelvis. Surgical service was consulted.  Past Medical History  Diagnosis Date  . Depression   . DDD (degenerative disc disease), cervical   . Osteoporosis   . Hernia of abdominal cavity   . Vitamin B 12 deficiency   . Collagenous colitis   . Hyperlipidemia   . Chronic kidney disease   . Urine incontinence   . Tobacco abuse   . Stroke [facility])    Past Surgical History  Procedure Laterality Date   . Abdominal hysterectomy    . Abdominal aortic aneurysm repair    . Cholecystectomy    . Back surgery  [id number]  . Appendectomy     Social History   Social History  . Marital Status: Married    Spouse Name: N/A  . Number of Children: N/A  . Years of Education: N/A   Social History Main Topics  . Smoking status: Current Every Day Smoker -- 0.25 packs/day    Types: Cigarettes  . Smokeless tobacco: Never Used  . Alcohol Use: No  . Drug Use: No  . Sexual Activity: No   Other Topics Concern  . None   Social History Narrative     Review of Systems  Constitutional: Positive for weight loss and malaise/fatigue. Negative for fever and chills.  HENT: Negative.   Eyes: Negative.   Respiratory: Negative for cough, sputum production, shortness of breath and wheezing.   Cardiovascular: Positive for claudication. Negative for chest pain and palpitations.  Gastrointestinal: Positive for heartburn, nausea, vomiting, abdominal pain and constipation.  Genitourinary: Negative.   Musculoskeletal: Positive for myalgias. Negative for back pain and joint pain.  Skin: Negative.   Neurological: Positive for weakness. Negative for dizziness and focal weakness.  Psychiatric/Behavioral: Negative for depression and hallucinations. The patient is nervous/anxious.      PHYSICAL EXAM: BP 117/48 mmHg  Pulse 81  Temp(Src) 97.7 F (36.5 C) (Oral)  Resp 18  Ht '5\' 4"'$  (1.626 m)  Wt 40.824 kg (90 lb)  BMI 15.44 kg/m2  SpO2 96%  LMP  (  Within Years)  Physical Exam  Constitutional: She is oriented to person, place, and time.  Thin cachectic woman  HENT:  Head: Normocephalic and atraumatic.  Eyes: EOM are normal. Pupils are equal, round, and reactive to light.  Neck: Normal range of motion. Neck supple.  Cardiovascular: Regular rhythm and normal heart sounds.   Pulmonary/Chest: Effort normal and breath sounds normal. No respiratory distress. She has no wheezes.  Abdominal: Soft. She exhibits  distension. There is tenderness. There is guarding. There is no rebound.  Musculoskeletal: Normal range of motion. She exhibits no edema or tenderness.  Neurological: She is oriented to person, place, and time. No cranial nerve deficit.  Skin: Skin is warm and dry.  Psychiatric: Her behavior is normal. Judgment normal.   Her abdomen is scaphoid without significant distention. She does have hypoactive but present bowel sounds. She has left lower quadrant tenderness and mild guarding. No hernias are noted.  Impression/Plan: I have independently reviewed her CT scan. She does appear to have a high-grade small bowel obstruction. With her renal insufficiency this is a complex insignificant setter problems. She's been on anticoagulation with antiplatelet medication which I suspect is related to her previous mini stroke history. She also has significant vascular disease and continues to smoke. We will arrange for her to be admitted hospital placed on nasogastric decompression. We'll arrange internal medicine nephrology consultation to assist in her management. I had a frank discussion with the patient's family. I'm concerned such a high-grade bowel obstruction she will require surgical intervention. Risks and benefits of our appointment outlined in agreement.   Dia Crawford III, MD  07/23/2015, 4:05 PM

## 2015-07-23 NOTE — Progress Notes (Signed)
Received pt from ED to Rm 222. Pt AOx4. VSS. No signs of acute distress.  NG tube in place and intact to LCS draining a thick mustard color substance.  IV access intact and infusing. Medications administered (See MAR). Admission completed. Assessment completed. DVT prophylaxis assessed and completed.   Education provided on call bell, bed alarm, telephone and IV/IV Pole/IV Alarms. 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 White Board was completed and updated PRN.   Dietary specification confirmed.   Family at bedside. Pt resting comfortably and quietly w/bed in low and locked position and call bell and telephone within reach. Will continue to monitor.

## 2015-07-23 NOTE — ED Provider Notes (Signed)
[facility] Emergency Department Provider Note  ____________________________________________  Time seen: Approximately 110 PM  I have reviewed the triage vital signs and the nursing notes.   HISTORY  Chief Complaint Abdominal Pain    HPI Melody Green is a 74 y.o. female with a history of AAA repair 2 who is presenting today with abdominal pain and vomiting. She was seen here this past Monday for similar complaint and was found to have a CAT scan that was consistent with enteritis. She was discharged to home and continued to have intermittent abdominal pain as well as vomiting whenever she ate something. Because she was not able to tolerate anything by mouth she has been eating very minimally and drinking very minimally. She denies any diarrhea. Denies any pain at this time. No chest pain or shortness of breath.   Past Medical History  Diagnosis Date  . Depression   . DDD (degenerative disc disease), cervical   . Osteoporosis   . Hernia of abdominal cavity   . Vitamin B 12 deficiency   . Collagenous colitis   . Hyperlipidemia   . Chronic kidney disease   . Urine incontinence   . Tobacco abuse   . Stroke [facility])     Patient Active Problem List   Diagnosis Date Noted  . Abdominal pain 06/14/2015  . Malnutrition ([place]) 05/03/2015  . Hypothyroid 05/03/2015  . 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 Positional vertigo of both ears 05/03/2015  . Depression 12/23/2014  . Hyperlipidemia 12/23/2014  . Essential hypertension 12/04/2014  . Dizziness 12/04/2014  . Hyponatremia 12/04/2014    Past Surgical History  Procedure Laterality Date  . Abdominal hysterectomy    . Abdominal aortic aneurysm repair    . Cholecystectomy    . Back surgery  [id number]  . Appendectomy      Current Outpatient Rx  Name  Route  Sig  Dispense  Refill  . atorvastatin (LIPITOR) 20 MG tablet   Oral   Take 1 tablet (20 mg total) by mouth daily.   90 tablet   4   . buPROPion  (WELLBUTRIN SR) 150 MG 12 hr tablet   Oral   Take 1 tablet (150 mg total) by mouth 2 (two) times daily. Patient not taking: Reported on 05/03/2015   180 tablet   4   . calcium carbonate (OS-CAL) 600 MG TABS tablet   Oral   Take 600 mg by mouth daily.         . citalopram (CELEXA) 10 MG tablet   Oral   Take 1 tablet (10 mg total) by mouth daily.   90 tablet   4   . clopidogrel (PLAVIX) 75 MG tablet      TAKE 1 TABLET ONE TIME DAILY   90 tablet   4   . cyanocobalamin (,VITAMIN B-12,) 1000 MCG/ML injection      INJECT 1 ML ONCE A MONTH   10 mL   3   . ibuprofen (MOTRIN IB) 200 MG tablet   Oral   Take 2 tablets (400 mg total) by mouth every 6 (six) hours as needed for moderate pain.   30 tablet   0   . levothyroxine (SYNTHROID, LEVOTHROID) 50 MCG tablet   Oral   Take 1 tablet (50 mcg total) by mouth daily. Alternate with 6mg pill   45 tablet   4   . levothyroxine (SYNTHROID, LEVOTHROID) 75 MCG tablet   Oral   Take 1 tablet (75 mcg total)  by mouth daily before breakfast. Alternate with 60mg pill   45 tablet   4   . LORazepam (ATIVAN) 1 MG tablet      TAKE 1 TO 2 TABLETS EVERY DAY AS NEEDED   120 tablet   1   . mometasone (NASONEX) 50 MCG/ACT nasal spray   Nasal   Place 2 sprays into the nose daily.         .Marland Kitchenomeprazole (PRILOSEC) 20 MG capsule   Oral   Take 1 capsule (20 mg total) by mouth daily.   30 capsule   3   . ondansetron (ZOFRAN ODT) 4 MG disintegrating tablet   Oral   Take 1 tablet (4 mg total) by mouth every 8 (eight) hours as needed for nausea or vomiting.   20 tablet   0   . triamcinolone cream (KENALOG) 0.1 %   Topical   Apply topically 2 (two) times daily.   45 g   2     Allergies Codeine; Morphine and related; and Ultram  Family History  Problem Relation Age of Onset  . Diabetes Mother   . Diabetes Father     Social History Social History  Substance Use Topics  . Smoking status: Current Every Day Smoker -- 0.25  packs/day    Types: Cigarettes  . Smokeless tobacco: Never Used  . Alcohol Use: No    Review of Systems Constitutional: No fever/chills Eyes: No visual changes. ENT: No sore throat. Cardiovascular: Denies chest pain. Respiratory: Denies shortness of breath. Gastrointestinal:No diarrhea.  No constipation. Genitourinary: Negative for dysuria. Musculoskeletal: Negative for back pain. Skin: Negative for rash. Neurological: Negative for headaches, focal weakness or numbness.  10-point ROS otherwise negative.  ____________________________________________   PHYSICAL EXAM:  VITAL SIGNS: ED Triage Vitals  Enc Vitals Group     BP 07/23/15 1140 97/55 mmHg     Pulse Rate 07/23/15 1140 95     Resp 07/23/15 1140 20     Temp 07/23/15 1140 97.7 F (36.5 C)     Temp Source 07/23/15 1140 Oral     SpO2 07/23/15 1140 98 %     Weight 07/23/15 1140 90 lb (40.824 kg)     Height 07/23/15 1140 '5\' 4"'$  (1.626 m)     Head Cir --      Peak Flow --      Pain Score 07/23/15 1138 8     Pain Loc --      Pain Edu? --      Excl. in [place] --     Constitutional: Alert and oriented. Well appearing and in no acute distress. Eyes: Conjunctivae are normal. PERRL. EOMI. Head: Atraumatic. Nose: No congestion/rhinnorhea. Mouth/Throat: Mucous membranes are moist.  Oropharynx non-erythematous. Neck: No stridor.   Cardiovascular: Normal rate, regular rhythm. Grossly normal heart sounds.  Good peripheral circulation. Respiratory: Normal respiratory effort.  No retractions. Lungs CTAB. Gastrointestinal: Soft with diffuse tenderness to palpation. Mild distention.  Musculoskeletal: No lower extremity tenderness nor edema.  No joint effusions. Neurologic:  Normal speech and language. No gross focal neurologic deficits are appreciated.  Skin:  Skin is warm, dry and intact. No rash noted. Psychiatric: Mood and affect are normal. Speech and behavior are normal.  ____________________________________________    LABS (all labs ordered are listed, but only abnormal results are displayed)  Labs Reviewed  COMPREHENSIVE METABOLIC PANEL - Abnormal; Notable for the following:    Sodium 132 (*)    Potassium 5.4 (*)    Chloride 94 (*)  CO2 20 (*)    BUN 64 (*)    Creatinine, Ser 5.66 (*)    ALT 12 (*)    GFR calc non Af Amer 7 (*)    GFR calc Af Amer 8 (*)    Anion gap 18 (*)    All other components within normal limits  CBC - Abnormal; Notable for the following:    WBC 3.3 (*)    RDW 15.1 (*)    All other components within normal limits  URINALYSIS COMPLETEWITH MICROSCOPIC ([facility] ONLY) - Abnormal; Notable for the following:    Color, Urine YELLOW (*)    APPearance HAZY (*)    Glucose, UA 50 (*)    Ketones, ur TRACE (*)    Hgb urine dipstick 1+ (*)    Protein, ur 100 (*)    Squamous Epithelial / LPF 6-30 (*)    All other components within normal limits  TROPONIN I - Abnormal; Notable for the following:    Troponin I 0.04 (*)    All other components within normal limits  LIPASE, BLOOD   ____________________________________________  EKG  ED ECG REPORT I, Doran Stabler, the attending physician, personally viewed and interpreted this ECG.   Date: 07/23/2015  EKG Time: 1316  Rate: 87  Rhythm: normal sinus rhythm  Axis: Normal axis  Intervals:none  ST&T Change: No ST segment elevation or depression. No abnormal T-wave inversion. Probable LVH with secondary repolarization abnormality. ____________________________________________  RADIOLOGY  CAT scan of the brain with no acute finding.  Small bowel obstruction in the pelvis probably proximal ileum. No discrete mass. Likely due to an adhesion. Small amount of ascites.  ____________________________________________   PROCEDURES   ____________________________________________   INITIAL IMPRESSION / ASSESSMENT AND PLAN / ED COURSE  Pertinent labs & imaging results that were available during my care of the patient were  reviewed by me and considered in my medical decision making (see chart for details).  ----------------------------------------- 2:50 PM on 07/23/2015 -----------------------------------------  Discussed the case with Dr. Renda Rolls of the surgical service who agrees to admit the patient. The patient is resting comfortably at this time and has not had any vomiting in the emergency department. I explained to the patient as well as the family members the results of the labs as well as CAT scan. They're aware of the diagnosis of small bowel obstruction as well as the acute renal failure which is likely from dehydration. I also discussed with them that we will need to start a nasogastric tube to help relieve the obstruction. The patient and the family are understanding of the plan for admission and treatment and are willing to comply. ____________________________________________   FINAL CLINICAL IMPRESSION(S) / ED DIAGNOSES  Acute renal failure. Nausea and vomiting. Small bowel obstruction.    Orbie Pyo, MD 07/23/15 1452

## 2015-07-23 NOTE — ED Notes (Signed)
Was seen on Monday for abd pain .Marland Kitchenstates pain is worse with nausea/vomiting

## 2015-07-24 ENCOUNTER — Inpatient Hospital Stay: Payer: Commercial Managed Care - HMO

## 2015-07-24 DIAGNOSIS — E43 Unspecified severe protein-calorie malnutrition: Secondary | ICD-10-CM | POA: Insufficient documentation

## 2015-07-24 LAB — BASIC METABOLIC PANEL
Anion gap: 8 (ref 5–15)
BUN: 54 mg/dL — AB (ref 6–20)
CHLORIDE: 101 mmol/L (ref 101–111)
CO2: 20 mmol/L — AB (ref 22–32)
Calcium: 7.5 mg/dL — ABNORMAL LOW (ref 8.9–10.3)
Creatinine, Ser: 3.72 mg/dL — ABNORMAL HIGH (ref 0.44–1.00)
GFR calc Af Amer: 13 mL/min — ABNORMAL LOW (ref >60)
GFR calc non Af Amer: 11 mL/min — ABNORMAL LOW (ref >60)
GLUCOSE: 138 mg/dL — AB (ref 65–99)
POTASSIUM: 3.4 mmol/L — AB (ref 3.5–5.1)
Sodium: 129 mmol/L — ABNORMAL LOW (ref 135–145)

## 2015-07-24 LAB — CBC
HEMATOCRIT: 40.5 % (ref 35.0–47.0)
Hemoglobin: 13.1 g/dL (ref 12.0–16.0)
MCH: 28.2 pg (ref 26.0–34.0)
MCHC: 32.3 g/dL (ref 32.0–36.0)
MCV: 87.5 fL (ref 80.0–100.0)
Platelets: 167 10*3/uL (ref 150–440)
RBC: 4.63 MIL/uL (ref 3.80–5.20)
RDW: 14.9 % — AB (ref 11.5–14.5)
WBC: 2.6 10*3/uL — ABNORMAL LOW (ref 3.6–11.0)

## 2015-07-24 MED ORDER — DEXTROSE-NACL 5-0.9 % IV SOLN
INTRAVENOUS | Status: DC
  Administered 2015-07-24 – 2015-07-26 (×6): via INTRAVENOUS

## 2015-07-24 MED ORDER — METOPROLOL TARTRATE 1 MG/ML IV SOLN
5.0000 mg | INTRAVENOUS | Status: DC | PRN
  Filled 2015-07-24: qty 5

## 2015-07-24 MED ORDER — PHENOL 1.4 % MT LIQD
1.0000 | OROMUCOSAL | Status: DC | PRN
  Administered 2015-07-25: 1 via OROMUCOSAL
  Filled 2015-07-24 (×2): qty 177

## 2015-07-24 MED ORDER — HEPARIN SODIUM (PORCINE) 5000 UNIT/ML IJ SOLN
5000.0000 [IU] | Freq: Two times a day (BID) | INTRAMUSCULAR | Status: DC
  Administered 2015-07-24 – 2015-07-26 (×4): 5000 [IU] via SUBCUTANEOUS
  Filled 2015-07-24 (×6): qty 1

## 2015-07-24 NOTE — Consult Note (Signed)
[facility] CONSULT NOTE    Date: 07/24/2015                  Patient Name:  Melody Green  MRN: [id number]  DOB: 11/14/1940  Age / Sex: 75 y.o., female         PCP: Golden Pop, MD                 Service Requesting Consult: Dr. Pat Patrick                 Reason for Consult: Acute renal failure            History of Present Illness: Patient is a 75 y.o. female with a PMHx of depression, degenerative disc disease, osteoporosis, history of abdominal cavity hernia, vitamin B12 deficiency, collagenous colitis, hyperlipidemia, tobacco abuse, CKD stage III baseline Cr 0.98/EGFR 55 who was admitted to [facility] on 07/23/2015 for evaluation of  abdominal pain, nausea, and vomiting. Apparently the symptoms have been going on intermittently for the past 10 days however have worsened over the past several days. CT scan in the emergency department revealed dilated small bowel consistent with enteritis on 07/19/2015. She was to follow-up with her primary care physician but was unable to. She then returned here for evaluation and was found have severe acute renal failure with a creatinine of 5.6. With IV fluid hydration this is now down to 3.72.  Repeat CT scan now demonstrates small bowel obstruction in the pelvis probably proximal ileum. No discrete masses noted. She now has an NG tube in place which is draining feculent material   Medications: Outpatient medications: Prescriptions prior to admission  Medication Sig Dispense Refill Last Dose  . atorvastatin (LIPITOR) 20 MG tablet Take 20 mg by mouth at bedtime.   07/22/2015 at Unknown time  . calcium carbonate (OS-CAL) 600 MG TABS tablet Take 600 mg by mouth at bedtime.    07/22/2015 at Unknown time  . citalopram (CELEXA) 10 MG tablet Take 10 mg by mouth at bedtime.   07/22/2015 at Unknown time  . clopidogrel (PLAVIX) 75 MG tablet Take 75 mg by mouth at bedtime.   07/22/2015 at 2030  . cyanocobalamin (,VITAMIN B-12,) 1000 MCG/ML injection  Inject 1,000 mcg into the muscle every 30 (thirty) days.   07/05/2015 at unknown   . ibuprofen (ADVIL,MOTRIN) 400 MG tablet Take 400 mg by mouth every 6 (six) hours as needed for mild pain.   07/22/2015 at 0800  . levothyroxine (SYNTHROID, LEVOTHROID) 50 MCG tablet Take 50 mcg by mouth every other day. Pt alternates with 76mg.   07/22/2015 at Unknown time  . levothyroxine (SYNTHROID, LEVOTHROID) 75 MCG tablet Take 75 mcg by mouth every other day. Pt alternates with 578m.   Past Week at Unknown time  . LORazepam (ATIVAN) 1 MG tablet Take 1-2 mg by mouth daily as needed for anxiety or sleep.   07/22/2015 at Unknown time  . mometasone (NASONEX) 50 MCG/ACT nasal spray Place 2 sprays into the nose daily as needed (for rhinitis).    Past Week at Unknown time  . omeprazole (PRILOSEC) 20 MG capsule Take 20 mg by mouth at bedtime.   07/22/2015 at Unknown time  . ondansetron (ZOFRAN ODT) 4 MG disintegrating tablet Take 1 tablet (4 mg total) by mouth every 8 (eight) hours as needed for nausea or vomiting. 20 tablet 0 07/22/2015 at Unknown time  . triamcinolone cream (KENALOG) 0.1 % Apply 1 application topically 2 (two) times  daily.   07/22/2015 at Unknown time    Current medications: Current Facility-Administered Medications  Medication Dose Route Frequency Provider Last Rate Last Dose  . acetaminophen (TYLENOL) tablet 650 mg  650 mg Oral Q6H PRN Dia Crawford III, MD       Or  . acetaminophen (TYLENOL) suppository 650 mg  650 mg Rectal Q6H PRN Dia Crawford III, MD      . dextrose 5 %-0.45 % sodium chloride infusion   Intravenous Continuous Dia Crawford III, MD 150 mL/hr at 07/24/15 0559    . diazepam (VALIUM) injection 1-2 mg  1-2 mg Intravenous Once Dia Crawford III, MD      . heparin injection 5,000 Units  5,000 Units Subcutaneous Q12H Dia Crawford III, MD      . HYDROmorphone (DILAUDID) injection 1 mg  1 mg Intravenous Q2H PRN Dia Crawford III, MD      . ondansetron (ZOFRAN-ODT) disintegrating tablet 4 mg  4 mg Oral Q6H PRN  Dia Crawford III, MD       Or  . ondansetron [facility]) injection 4 mg  4 mg Intravenous Q6H PRN Dia Crawford III, MD      . pantoprazole (PROTONIX) injection 40 mg  40 mg Intravenous QHS Dia Crawford III, MD   40 mg at 07/23/15 2144      Allergies: Allergies  Allergen Reactions  . Codeine Nausea And Vomiting  . Morphine And Related Nausea And Vomiting  . Ultram [Tramadol] Itching      Past Medical History: Past Medical History  Diagnosis Date  . Depression   . DDD (degenerative disc disease), cervical   . Osteoporosis   . Hernia of abdominal cavity   . Vitamin B 12 deficiency   . Collagenous colitis   . Hyperlipidemia   . Chronic kidney disease   . Urine incontinence   . Tobacco abuse   . Stroke [facility])      Past Surgical History: Past Surgical History  Procedure Laterality Date  . Abdominal hysterectomy    . Abdominal aortic aneurysm repair    . Cholecystectomy    . Back surgery  [id number]  . Appendectomy       Family History: Family History  Problem Relation Age of Onset  . Diabetes Mother   . Diabetes Father      Social History: Social History   Social History  . Marital Status: Married    Spouse Name: N/A  . Number of Children: N/A  . Years of Education: N/A   Occupational History  . Not on file.   Social History Main Topics  . Smoking status: Current Every Day Smoker -- 0.25 packs/day    Types: Cigarettes  . Smokeless tobacco: Never Used  . Alcohol Use: No  . Drug Use: No  . Sexual Activity: No   Other Topics Concern  . Not on file   Social History Narrative     Review of Systems: Review of Systems  Constitutional: Positive for chills, weight loss and malaise/fatigue. Negative for fever.  HENT: Negative for congestion, hearing loss and sore throat.   Eyes: Negative for blurred vision and double vision.  Respiratory: Negative for cough, hemoptysis and shortness of breath.   Cardiovascular: Negative for chest pain, palpitations and  orthopnea.  Gastrointestinal: Positive for nausea, vomiting and abdominal pain. Negative for heartburn and diarrhea.  Genitourinary: Negative for dysuria, urgency and frequency.  Musculoskeletal: Negative for myalgias, back pain and neck pain.  Skin: Negative for itching and rash.  Neurological: Negative for dizziness, focal weakness and headaches.  Endo/Heme/Allergies: Negative for polydipsia. Does not bruise/bleed easily.  Psychiatric/Behavioral: Negative for depression. The patient is not nervous/anxious.      Vital Signs: Blood pressure 107/94, pulse 137, temperature 97.6 F (36.4 C), temperature source Oral, resp. rate 17, height _0  (1.626 m), weight 40.824 kg (90 lb), SpO2 94 %.  Weight trends: Filed Weights   07/23/15 1140  Weight: 40.824 kg (90 lb)    Physical Exam: General: Having chiles  Head: Normocephalic, atraumatic.  Eyes: Anicteric, EOMI  Nose: NG in place  Throat: Oropharynx nonerythematous, no exudate appreciated. OM dry  Neck: Supple, trachea midline.  Lungs:  Normal respiratory effort. Clear to auscultation BL without crackles or wheezes.  Heart: RRR. S1 and S2 normal without gallop, murmur, or rubs.  Abdomen:  Mild distension, mild diffuse tenderness, guarding present  Extremities: No pretibial edema.  Neurologic: A&O X3, Motor strength is 5/5 in the all 4 extremities  Skin: No visible rashes, scars.    Lab results: Basic Metabolic Panel:  Recent Labs Lab 07/19/15 1422 07/23/15 1154 07/24/15 0633  NA 136 132* 129*  K 3.7 5.4* 3.4*  CL 101 94* 101  CO2 26 20* 20*  GLUCOSE 104* 86 138*  BUN 10 64* 54*  CREATININE 0.98 5.66* 3.72*  CALCIUM 9.0 9.2 7.5*    Liver Function Tests:  Recent Labs Lab 07/19/15 1422 07/23/15 1154  AST 27 26  ALT 16 12*  ALKPHOS 58 56  BILITOT 0.2* 1.0  PROT 6.8 7.7  ALBUMIN 3.9 4.2    Recent Labs Lab 07/19/15 1422 07/23/15 1154  LIPASE 42 42   No results for input(s): AMMONIA in the last 168  hours.  CBC:  Recent Labs Lab 07/19/15 1422 07/23/15 1154 07/24/15 0633  WBC 7.3 3.3* 2.6*  HGB 13.7 14.6 13.1  HCT 41.8 45.1 40.5  MCV 88.2 86.7 87.5  PLT 187 196 167    Cardiac Enzymes:  Recent Labs Lab 07/23/15 1154  TROPONINI 0.04*    BNP: Invalid input(s): POCBNP  CBG: No results for input(s): GLUCAP in the last 168 hours.  Microbiology: Results for orders placed or performed in visit on 12/23/14  Microscopic Examination     Status: None   Collection Time: 12/23/14 11:16 AM  Result Value Ref Range Status   WBC, UA 0-5 0 -  5 /hpf Final   RBC, UA 0-2 0 -  2 /hpf Final   Epithelial Cells (non renal) 0-10 0 - 10 /hpf Final   Bacteria, UA Few None seen/Few Final    Coagulation Studies: No results for input(s): LABPROT, INR in the last 72 hours.  Urinalysis:  Recent Labs  07/23/15 1323  COLORURINE YELLOW*  LABSPEC 1.014  PHURINE 5.0  GLUCOSEU 50*  HGBUR 1+*  BILIRUBINUR NEGATIVE  KETONESUR TRACE*  PROTEINUR 100*  NITRITE NEGATIVE  LEUKOCYTESUR NEGATIVE      Imaging: Ct Abdomen Pelvis Wo Contrast  07/23/2015  CLINICAL DATA:  Progressive abdominal pain with nausea and vomiting. EXAM: CT ABDOMEN AND PELVIS WITHOUT CONTRAST TECHNIQUE: Multidetector CT imaging of the abdomen and pelvis was performed following the standard protocol without IV contrast. COMPARISON:  CT scan dated 07/19/2015 FINDINGS: Lower chest: Small hiatal hernia. Minimal scarring at the left lung base, stable. Hepatobiliary: Cholecystectomy. Liver parenchyma and biliary tree are normal. Pancreas: Chronic calcifications in the pancreatic body, unchanged. No acute abnormalities. Spleen: Tiny calcified granulomas in the spleen.  Otherwise normal. Adrenals/Urinary Tract: Tiny stones in  the lower pole of the left kidney. Otherwise normal. Stomach/Bowel: The patient has a small bowel obstruction. The transition point appears to be in the mid pelvis just to the right of midline, probably on image  51 of series 5. Small-bowel distal to this point is normal. The colon is not distended. Ascites in the pelvis appears slightly less prominent. There is a small rectocele containing ascites just to the left of the rectum. Diverticulosis of the sigmoid portion of the colon. 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 Reproductive: Uterus has been removed.  Ovaries are not identified. Other: No free air. Musculoskeletal: No acute abnormality. Old compression fractures of L3 and T11 treated with vertebroplasty. IMPRESSION: Small bowel obstruction in the pelvis, probably proximal ileum. No discrete mass. This is probably due to an adhesion. Small amount of ascites. Critical Value/emergent results were called by telephone at the time of interpretation on 07/23/2015 at 2:32 pm to Dr. Larae Grooms , who verbally acknowledged these results. Electronically Signed   By: Lorriane Shire M.D.   On: 07/23/2015 14:32   Ct Head Wo Contrast  07/23/2015  CLINICAL DATA:  Abdominal distention EXAM: CT HEAD WITHOUT CONTRAST TECHNIQUE: Contiguous axial images were obtained from the base of the skull through the vertex without intravenous contrast. COMPARISON:  01/21/2014 FINDINGS: Skull and Sinuses:Negative for fracture or destructive process. The visualized mastoids, middle ears, and imaged paranasal sinuses are clear. Visualized orbits: Negative. Brain: No evidence of acute infarction, hemorrhage, hydrocephalus, or mass lesion/mass effect. Extensive chronic small vessel disease with chronic ischemic gliosis confluent throughout the bilateral cerebral white matter. Remote small vessel infarct in the right caudate head. Remote lacunar infarct versus dilated perivascular space below the right putamen. Normal cerebral volume. IMPRESSION: 1. No acute finding. 2. Advanced chronic microvascular ischemia. Electronically Signed   By: Monte Fantasia M.D.   On: 07/23/2015 14:22    Dg Abd 2 Views  07/24/2015  CLINICAL DATA:  Worsening abdominal pain. EXAM: ABDOMEN - 2 VIEW COMPARISON:  CT of the abdomen and pelvis 07/23/2015 FINDINGS: Enteric catheter is partially visualized, tip slightly past the expected location of gastroesophageal junction, side hole within the esophagus. There is persistent small bowel obstruction with maximum diameter of dilated thick-walled small bowel loops of 3.2 cm. The colon is decompressed. No evidence of free gas or pneumatosis. No evidence of organomegaly. Vertebroplasty of T11 and L3 is again seen. Postsurgical clips are seen overlying the mid abdomen, and right upper outer quadrant. IMPRESSION: Persistent small bowel obstruction, with diffuse small bowel wall thickening. Differential diagnosis includes infectious or ischemic causes. Enteric catheter tip slightly past the expected location of the gastroesophageal junction. Advancement may be considered. Electronically Signed   By: Fidela Salisbury M.D.   On: 07/24/2015 10:33      Assessment & Plan: Pt is a 75 y.o. female with a PMHx of depression, degenerative disc disease, osteoporosis, history of abdominal cavity hernia, vitamin B12 deficiency, collagenous colitis, hyperlipidemia, tobacco abuse, CKD stage III baseline Cr 0.98/EGFR 55 who was admitted to [facility] on 07/23/2015 for evaluation of  abdominal pain, nausea, and vomiting.  Presents with high grade small bowel obstruction.  1.  Acute renal failure related to poor po intake, nausea, vomiting, obstruction. 2.  CKD stage III baseline Cr 0.98 with egfr of 55. 3.  Hyponatremia, hypovolemic in nature. 4.  Small bowel obstruction, s/p NG decompression, surgery following.  Plan:  The patient presents with a complex case. She certainly appears  to have a small bowel obstruction and is status post NG tube placement. This is draining feculent material. In terms of her acute renal failure this is likely related to poor by mouth intake, nausea,  vomiting, and underlying obstruction. She also has underlying chronic kidney disease as above. Continue IV fluid hydration at present.  No obstruction of the kidneys noted on CT scan.  No urgent indication for dialysis at the moment as creatinine is improving. Further workup and treatment plan regarding obstruction per surgery. Thank you for consultation.

## 2015-07-24 NOTE — Progress Notes (Signed)
Initial Nutrition Assessment  DOCUMENTATION CODES:   Severe malnutrition in context of acute illness/injury, although likely in the context of chronic illness as well  INTERVENTION:   Coordination of Care: await diet progression as medically able Medical Food Supplement Therapy: pt would benefit from supplement once diet order able to be advanced   NUTRITION DIAGNOSIS:   Malnutrition related to acute illness as evidenced by severe depletion of body fat, severe depletion of muscle mass, energy intake < or equal to 50% for > or equal to 5 days.  GOAL:   Patient will meet greater than or equal to 90% of their needs  MONITOR:    (Energy Intake, Anthropometrics, Electrolyte and renal Profile, Digestive system)  REASON FOR ASSESSMENT:   Malnutrition Screening Tool    ASSESSMENT:   Pt admitted with abdominal pain with n/v for the past week to 10 days. Pt with SBO per MD note, currently with NG tube in to suction.   Past Medical History  Diagnosis Date  . Depression   . DDD (degenerative disc disease), cervical   . Osteoporosis   . Hernia of abdominal cavity   . Vitamin B 12 deficiency   . Collagenous colitis   . Hyperlipidemia   . Chronic kidney disease   . Urine incontinence   . Tobacco abuse   . Stroke [facility])    Diet Order:  Diet NPO time specified    Current Nutrition: Pt currently NPO  Food/Nutrition-Related History: Pt reports eating only a few pieces of toast since Monday. Pt reports poor po intake and intolerance of food has been over the past week to 10 days PTA. Pt reports prior to that 'not a big eater anyways.' RD noted per chart review, MD encouraging better po intake October 2016 with malnutrition noted. Pt reports not liking Ensure/Boost supplements.   Scheduled Medications:  . diazepam  1-2 mg Intravenous Once  . heparin subcutaneous  5,000 Units Subcutaneous Q12H  . pantoprazole (PROTONIX) IV  40 mg Intravenous QHS    Continuous Medications:  .  dextrose 5 % and 0.9% NaCl     D5 at 179m/hr providing 408kcals in 24 hours   Electrolyte/Renal Profile and Glucose Profile:   Recent Labs Lab 07/19/15 1422 07/23/15 1154 07/24/15 0633  NA 136 132* 129*  K 3.7 5.4* 3.4*  CL 101 94* 101  CO2 26 20* 20*  BUN 10 64* 54*  CREATININE 0.98 5.66* 3.72*  CALCIUM 9.0 9.2 7.5*  GLUCOSE 104* 86 138*   Protein Profile:   Recent Labs Lab 07/19/15 1422 07/23/15 1154  ALBUMIN 3.9 4.2    Gastrointestinal Profile: Last BM: 07/19/2015   Nutrition-Focused Physical Exam Findings: Nutrition-Focused physical exam completed. Findings are moderate-severe fat depletion, mild-severe muscle depletion, and no edema.    Weight Change: Pt reports weight loss of 10lbs in the past couple of weeks, pt reports UBW of 90lbs and current weight of 80lbs? Per [facility] encounters pt weight was last 90lbs December 2016. Pt weight of 96lbs September 2016 and 94lbs October 2016 (4% weight loss in 4 months, 5% weight loss in 5 months)    Height:   Ht Readings from Last 1 Encounters:  07/23/15 '5\' 4"'$  (1.626 m)    Weight:   Wt Readings from Last 1 Encounters:  07/23/15 90 lb (40.824 kg)    Wt Readings from Last 10 Encounters:  07/23/15 90 lb (40.824 kg)  07/19/15 90 lb (40.824 kg)  06/14/15 90 lb 9.6 oz (41.096 kg)  05/03/15  94 lb 12.8 oz (43.001 kg)  03/22/15 96 lb 6.4 oz (43.727 kg)  12/31/14 98 lb 6.4 oz (44.634 kg)  12/23/14 96 lb 9.6 oz (43.817 kg)  12/04/14 96 lb 9.6 oz (43.817 kg)  09/28/14 97 lb (43.999 kg)     Ideal Body Weight:   59kg  BMI:  Body mass index is 15.44 kg/(m^2).    Estimated Nutritional Needs: using IBW of 59kg  Kcal:  BEE: 1070kcals, TEE: (IF 1.1-1.3)(AF 1.3) 1530-1800kcals  Protein:  65-77g protein (1.1-1.3g/kg)  Fluid:  1475-1792m of fluid (25-350mkg)  EDUCATION NEEDS:   Education needs no appropriate at this time   HIEnterpriseRD, LDN Pager ([phone]/On-Call Pager ([phone]

## 2015-07-24 NOTE — Progress Notes (Signed)
CC: Abdominal pain Subjective: Patient reports that her pain is much improved.  Objective: Vital signs in last 24 hours: Temp:  [97.6 F (36.4 C)-98.5 F (36.9 C)] 97.6 F (36.4 C) (01/21 1054) Pulse Rate:  [81-137] 137 (01/21 1054) Resp:  [14-20] 17 (01/21 1054) BP: (97-121)/(48-94) 107/94 mmHg (01/21 1054) SpO2:  [94 %-98 %] 94 % (01/21 1054) Weight:  [40.824 kg (90 lb)] 40.824 kg (90 lb) (01/20 1140) Last BM Date: 07/19/15  Intake/Output from previous day: 01/20 0701 - 01/21 0700 In: 2113 [I.V.:1963; NG/GT:150] Out: 270 [Emesis/NG output:270] Intake/Output this shift: Total I/O In: 362.5 [I.V.:332.5; NG/GT:30] Out: -   Physical exam:  Gen.: No acute distress, NG tube draining a thick yellow fluid Chest: Clear to auscultation Heart: Regular rhythm Abdomen: Soft, nontender, nondistended.  Lab Results: CBC   Recent Labs  07/23/15 1154 07/24/15 0633  WBC 3.3* 2.6*  HGB 14.6 13.1  HCT 45.1 40.5  PLT 196 167   BMET  Recent Labs  07/23/15 1154 07/24/15 0633  NA 132* 129*  K 5.4* 3.4*  CL 94* 101  CO2 20* 20*  GLUCOSE 86 138*  BUN 64* 54*  CREATININE 5.66* 3.72*  CALCIUM 9.2 7.5*   PT/INR No results for input(s): LABPROT, INR in the last 72 hours. ABG No results for input(s): PHART, HCO3 in the last 72 hours.  Invalid input(s): PCO2, PO2  Studies/Results: Ct Abdomen Pelvis Wo Contrast  07/23/2015  CLINICAL DATA:  Progressive abdominal pain with nausea and vomiting. EXAM: CT ABDOMEN AND PELVIS WITHOUT CONTRAST TECHNIQUE: Multidetector CT imaging of the abdomen and pelvis was performed following the standard protocol without IV contrast. COMPARISON:  CT scan dated 07/19/2015 FINDINGS: Lower chest: Small hiatal hernia. Minimal scarring at the left lung base, stable. Hepatobiliary: Cholecystectomy. Liver parenchyma and biliary tree are normal. Pancreas: Chronic calcifications in the pancreatic body, unchanged. No acute abnormalities. Spleen: Tiny calcified  granulomas in the spleen.  Otherwise normal. Adrenals/Urinary Tract: Tiny stones in the lower pole of the left kidney. Otherwise normal. Stomach/Bowel: The patient has a small bowel obstruction. The transition point appears to be in the mid pelvis just to the right of midline, probably on image 51 of series 5. Small-bowel distal to this point is normal. The colon is not distended. Ascites in the pelvis appears slightly less prominent. There is a small rectocele containing ascites just to the left of the rectum. Diverticulosis of the sigmoid portion of the colon. 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 Reproductive: Uterus has been removed.  Ovaries are not identified. Other: No free air. Musculoskeletal: No acute abnormality. Old compression fractures of L3 and T11 treated with vertebroplasty. IMPRESSION: Small bowel obstruction in the pelvis, probably proximal ileum. No discrete mass. This is probably due to an adhesion. Small amount of ascites. Critical Value/emergent results were called by telephone at the time of interpretation on 07/23/2015 at 2:32 pm to Dr. Larae Grooms , who verbally acknowledged these results. Electronically Signed   By: Lorriane Shire M.D.   On: 07/23/2015 14:32   Ct Head Wo Contrast  07/23/2015  CLINICAL DATA:  Abdominal distention EXAM: CT HEAD WITHOUT CONTRAST TECHNIQUE: Contiguous axial images were obtained from the base of the skull through the vertex without intravenous contrast. COMPARISON:  01/21/2014 FINDINGS: Skull and Sinuses:Negative for fracture or destructive process. The visualized mastoids, middle ears, and imaged paranasal sinuses are clear. Visualized orbits: Negative. Brain: No evidence of acute infarction, hemorrhage, hydrocephalus, or mass lesion/mass  effect. Extensive chronic small vessel disease with chronic ischemic gliosis confluent throughout the bilateral cerebral white matter. Remote small  vessel infarct in the right caudate head. Remote lacunar infarct versus dilated perivascular space below the right putamen. Normal cerebral volume. IMPRESSION: 1. No acute finding. 2. Advanced chronic microvascular ischemia. Electronically Signed   By: Monte Fantasia M.D.   On: 07/23/2015 14:22   Dg Abd 2 Views  07/24/2015  CLINICAL DATA:  Worsening abdominal pain. EXAM: ABDOMEN - 2 VIEW COMPARISON:  CT of the abdomen and pelvis 07/23/2015 FINDINGS: Enteric catheter is partially visualized, tip slightly past the expected location of gastroesophageal junction, side hole within the esophagus. There is persistent small bowel obstruction with maximum diameter of dilated thick-walled small bowel loops of 3.2 cm. The colon is decompressed. No evidence of free gas or pneumatosis. No evidence of organomegaly. Vertebroplasty of T11 and L3 is again seen. Postsurgical clips are seen overlying the mid abdomen, and right upper outer quadrant. IMPRESSION: Persistent small bowel obstruction, with diffuse small bowel wall thickening. Differential diagnosis includes infectious or ischemic causes. Enteric catheter tip slightly past the expected location of the gastroesophageal junction. Advancement may be considered. Electronically Signed   By: Fidela Salisbury M.D.   On: 07/24/2015 10:33    Anti-infectives: Anti-infectives    None      Assessment/Plan:  75 year old female with a small bowel obstruction. Patient has clinically improved from admission. Discussed with the patient that given her image finding she may yet require surgery for resolution of her bowel obstruction. She does have some improvement in her renal function but continues to have an elevated creatinine. Internal medicine and nephrology consults are still pending today. We'll continue to trend her NG output and monitor for hopeful return of bowel function without operative intervention.  Bryce Cheever T. Adonis Huguenin, MD, FACS  07/24/2015

## 2015-07-24 NOTE — Consult Note (Signed)
[place] [facility] [place] NAME: Melody Green    MR#:  [id number]  DATE OF BIRTH:  1940-08-27  DATE OF ADMISSION:  07/23/2015  PRIMARY CARE PHYSICIAN: Golden Pop, MD   REQUESTING/REFERRING PHYSICIAN:  Pat Patrick  CHIEF COMPLAINT:   Chief Complaint  Patient presents with  . Abdominal Pain    HISTORY OF PRESENT ILLNESS: Melody Green  is a 75 y.o. female with a known history of osteoporosis, vitamin B12 deficiency, chronic kidney disease, hyperlipidemia, stroke, urinary incontinence- came to emergency room after having vomiting for last few days and noted to be having intestinal obstruction and severe dehydration. Admitted to surgical team for conservative management she is currently on NGT suction, and IV fluids. Renal function improved compared to yesterday. Patient does not have any new complaints. Medical consult was called in for medical management while patient is nothing by mouth.  PAST MEDICAL HISTORY:   Past Medical History  Diagnosis Date  . Depression   . DDD (degenerative disc disease), cervical   . Osteoporosis   . Hernia of abdominal cavity   . Vitamin B 12 deficiency   . Collagenous colitis   . Hyperlipidemia   . Chronic kidney disease   . Urine incontinence   . Tobacco abuse   . Stroke [facility])     PAST SURGICAL HISTORY: Past Surgical History  Procedure Laterality Date  . Abdominal hysterectomy    . Abdominal aortic aneurysm repair    . Cholecystectomy    . Back surgery  [id number]  . Appendectomy      SOCIAL HISTORY:  Social History  Substance Use Topics  . Smoking status: Current Every Day Smoker -- 0.25 packs/day    Types: Cigarettes  . Smokeless tobacco: Never Used  . Alcohol Use: No    FAMILY HISTORY:  Family History  Problem Relation Age of Onset  . Diabetes Mother   . Diabetes Father     DRUG ALLERGIES:  Allergies  Allergen Reactions  . Codeine Nausea And Vomiting  . Morphine And Related Nausea And  Vomiting  . Ultram [Tramadol] Itching    REVIEW OF SYSTEMS:   CONSTITUTIONAL: No fever, fatigue or weakness.  EYES: No blurred or double vision.  EARS, NOSE, AND THROAT: No tinnitus or ear pain.  RESPIRATORY: No cough, shortness of breath, wheezing or hemoptysis.  CARDIOVASCULAR: No chest pain, orthopnea, edema.  GASTROINTESTINAL: positive for nausea, vomiting, no diarrhea or abdominal pain.  GENITOURINARY: No dysuria, hematuria.  ENDOCRINE: No polyuria, nocturia,  HEMATOLOGY: No anemia, easy bruising or bleeding SKIN: No rash or lesion. MUSCULOSKELETAL: No joint pain or arthritis.   NEUROLOGIC: No tingling, numbness, weakness.  PSYCHIATRY: No anxiety or depression.   MEDICATIONS AT HOME:  Prior to Admission medications   Medication Sig Start Date End Date Taking? Authorizing Provider  atorvastatin (LIPITOR) 20 MG tablet Take 20 mg by mouth at bedtime.   Yes Historical Provider, MD  calcium carbonate (OS-CAL) 600 MG TABS tablet Take 600 mg by mouth at bedtime.    Yes Historical Provider, MD  citalopram (CELEXA) 10 MG tablet Take 10 mg by mouth at bedtime.   Yes Historical Provider, MD  clopidogrel (PLAVIX) 75 MG tablet Take 75 mg by mouth at bedtime.   Yes Historical Provider, MD  cyanocobalamin (,VITAMIN B-12,) 1000 MCG/ML injection Inject 1,000 mcg into the muscle every 30 (thirty) days.   Yes Historical Provider, MD  ibuprofen (ADVIL,MOTRIN) 400 MG tablet Take 400 mg by mouth every  6 (six) hours as needed for mild pain.   Yes Historical Provider, MD  levothyroxine (SYNTHROID, LEVOTHROID) 50 MCG tablet Take 50 mcg by mouth every other day. Pt alternates with 21mg.   Yes Historical Provider, MD  levothyroxine (SYNTHROID, LEVOTHROID) 75 MCG tablet Take 75 mcg by mouth every other day. Pt alternates with 573m.   Yes Historical Provider, MD  LORazepam (ATIVAN) 1 MG tablet Take 1-2 mg by mouth daily as needed for anxiety or sleep.   Yes Historical Provider, MD  mometasone (NASONEX) 50  MCG/ACT nasal spray Place 2 sprays into the nose daily as needed (for rhinitis).    Yes Historical Provider, MD  omeprazole (PRILOSEC) 20 MG capsule Take 20 mg by mouth at bedtime.   Yes Historical Provider, MD  ondansetron (ZOFRAN ODT) 4 MG disintegrating tablet Take 1 tablet (4 mg total) by mouth every 8 (eight) hours as needed for nausea or vomiting. 07/19/15  Yes KeHarvest DarkMD  triamcinolone cream (KENALOG) 0.1 % Apply 1 application topically 2 (two) times daily.   Yes Historical Provider, MD      PHYSICAL EXAMINATION:   VITAL SIGNS: Blood pressure 107/94, pulse 137, temperature 97.6 F (36.4 C), temperature source Oral, resp. rate 17, height '5\' 4"'$  (1.626 m), weight 40.824 kg (90 lb), SpO2 94 %.  GENERAL:  [age].o.-year-old thin patient lying in the bed with no acute distress.  EYES: Pupils equal, round, reactive to light and accommodation. No scleral icterus. Extraocular muscles intact.  HEENT: Head atraumatic, normocephalic. Oropharynx and nasopharynx clear.  NECK:  Supple, no jugular venous distention. No thyroid enlargement, no tenderness.  LUNGS: Normal breath sounds bilaterally, no wheezing, rales,rhonchi or crepitation. No use of accessory muscles of respiration.  CARDIOVASCULAR: S1, S2 normal. No murmurs, rubs, or gallops.  ABDOMEN: Soft, nontender, nondistended. Bowel sounds slugish. No organomegaly or mass. NGT in place- with fecal like material draining. EXTREMITIES: No pedal edema, cyanosis, or clubbing.  NEUROLOGIC: Cranial nerves II through XII are intact. Muscle strength 5/5 in all extremities. Sensation intact. Gait not checked.  PSYCHIATRIC: The patient is alert and oriented x 3.  SKIN: No obvious rash, lesion, or ulcer.   LABORATORY PANEL:   CBC  Recent Labs Lab 07/19/15 1422 07/23/15 1154 07/24/15 0633  WBC 7.3 3.3* 2.6*  HGB 13.7 14.6 13.1  HCT 41.8 45.1 40.5  PLT 187 196 167  MCV 88.2 86.7 87.5  MCH 28.9 28.1 28.2  MCHC 32.8 32.4 32.3  RDW 14.8*  15.1* 14.9*   ------------------------------------------------------------------------------------------------------------------  Chemistries   Recent Labs Lab 07/19/15 1422 07/23/15 1154 07/24/15 0633  NA 136 132* 129*  K 3.7 5.4* 3.4*  CL 101 94* 101  CO2 26 20* 20*  GLUCOSE 104* 86 138*  BUN 10 64* 54*  CREATININE 0.98 5.66* 3.72*  CALCIUM 9.0 9.2 7.5*  AST 27 26  --   ALT 16 12*  --   ALKPHOS 58 56  --   BILITOT 0.2* 1.0  --    ------------------------------------------------------------------------------------------------------------------ estimated creatinine clearance is 8.4 mL/min (by C-G formula based on Cr of 3.72). ------------------------------------------------------------------------------------------------------------------ No results for input(s): TSH, T4TOTAL, T3FREE, THYROIDAB in the last 72 hours.  Invalid input(s): FREET3   Coagulation profile No results for input(s): INR, PROTIME in the last 168 hours. ------------------------------------------------------------------------------------------------------------------- No results for input(s): DDIMER in the last 72 hours. -------------------------------------------------------------------------------------------------------------------  Cardiac Enzymes  Recent Labs Lab 07/23/15 1154  TROPONINI 0.04*   ------------------------------------------------------------------------------------------------------------------ Invalid input(s): POCBNP  ---------------------------------------------------------------------------------------------------------------  Urinalysis  Component Value Date/Time   COLORURINE YELLOW* 07/23/2015 1323   COLORURINE Yellow 06/06/2013 0022   APPEARANCEUR HAZY* 07/23/2015 1323   APPEARANCEUR Clear 06/06/2013 0022   LABSPEC 1.014 07/23/2015 1323   LABSPEC 1.023 06/06/2013 0022   PHURINE 5.0 07/23/2015 1323   PHURINE 5.0 06/06/2013 0022   GLUCOSEU 50* 07/23/2015 1323    GLUCOSEU Negative 06/06/2013 0022   HGBUR 1+* 07/23/2015 1323   HGBUR Negative 06/06/2013 0022   BILIRUBINUR NEGATIVE 07/23/2015 1323   BILIRUBINUR Negative 12/23/2014 1116   BILIRUBINUR Negative 06/06/2013 0022   KETONESUR TRACE* 07/23/2015 1323   KETONESUR Negative 06/06/2013 0022   PROTEINUR 100* 07/23/2015 1323   PROTEINUR Negative 06/06/2013 0022   NITRITE NEGATIVE 07/23/2015 1323   NITRITE Negative 12/23/2014 1116   NITRITE Positive 06/06/2013 0022   LEUKOCYTESUR NEGATIVE 07/23/2015 1323   LEUKOCYTESUR 1+* 12/23/2014 1116   LEUKOCYTESUR 1+ 06/06/2013 0022     RADIOLOGY: Ct Abdomen Pelvis Wo Contrast  07/23/2015  CLINICAL DATA:  Progressive abdominal pain with nausea and vomiting. EXAM: CT ABDOMEN AND PELVIS WITHOUT CONTRAST TECHNIQUE: Multidetector CT imaging of the abdomen and pelvis was performed following the standard protocol without IV contrast. COMPARISON:  CT scan dated 07/19/2015 FINDINGS: Lower chest: Small hiatal hernia. Minimal scarring at the left lung base, stable. Hepatobiliary: Cholecystectomy. Liver parenchyma and biliary tree are normal. Pancreas: Chronic calcifications in the pancreatic body, unchanged. No acute abnormalities. Spleen: Tiny calcified granulomas in the spleen.  Otherwise normal. Adrenals/Urinary Tract: Tiny stones in the lower pole of the left kidney. Otherwise normal. Stomach/Bowel: The patient has a small bowel obstruction. The transition point appears to be in the mid pelvis just to the right of midline, probably on image 51 of series 5. Small-bowel distal to this point is normal. The colon is not distended. Ascites in the pelvis appears slightly less prominent. There is a small rectocele containing ascites just to the left of the rectum. Diverticulosis of the sigmoid portion of the colon. 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 Reproductive: Uterus has been removed.  Ovaries are not  identified. Other: No free air. Musculoskeletal: No acute abnormality. Old compression fractures of L3 and T11 treated with vertebroplasty. IMPRESSION: Small bowel obstruction in the pelvis, probably proximal ileum. No discrete mass. This is probably due to an adhesion. Small amount of ascites. Critical Value/emergent results were called by telephone at the time of interpretation on 07/23/2015 at 2:32 pm to Dr. Larae Grooms , who verbally acknowledged these results. Electronically Signed   By: Lorriane Shire M.D.   On: 07/23/2015 14:32   Ct Head Wo Contrast  07/23/2015  CLINICAL DATA:  Abdominal distention EXAM: CT HEAD WITHOUT CONTRAST TECHNIQUE: Contiguous axial images were obtained from the base of the skull through the vertex without intravenous contrast. COMPARISON:  01/21/2014 FINDINGS: Skull and Sinuses:Negative for fracture or destructive process. The visualized mastoids, middle ears, and imaged paranasal sinuses are clear. Visualized orbits: Negative. Brain: No evidence of acute infarction, hemorrhage, hydrocephalus, or mass lesion/mass effect. Extensive chronic small vessel disease with chronic ischemic gliosis confluent throughout the bilateral cerebral white matter. Remote small vessel infarct in the right caudate head. Remote lacunar infarct versus dilated perivascular space below the right putamen. Normal cerebral volume. IMPRESSION: 1. No acute finding. 2. Advanced chronic microvascular ischemia. Electronically Signed   By: Monte Fantasia M.D.   On: 07/23/2015 14:22   Dg Abd 2 Views  07/24/2015  CLINICAL DATA:  Worsening abdominal pain. EXAM: ABDOMEN -  2 VIEW COMPARISON:  CT of the abdomen and pelvis 07/23/2015 FINDINGS: Enteric catheter is partially visualized, tip slightly past the expected location of gastroesophageal junction, side hole within the esophagus. There is persistent small bowel obstruction with maximum diameter of dilated thick-walled small bowel loops of 3.2 cm. The colon is  decompressed. No evidence of free gas or pneumatosis. No evidence of organomegaly. Vertebroplasty of T11 and L3 is again seen. Postsurgical clips are seen overlying the mid abdomen, and right upper outer quadrant. IMPRESSION: Persistent small bowel obstruction, with diffuse small bowel wall thickening. Differential diagnosis includes infectious or ischemic causes. Enteric catheter tip slightly past the expected location of the gastroesophageal junction. Advancement may be considered. Electronically Signed   By: Fidela Salisbury M.D.   On: 07/24/2015 10:33    EKG: Orders placed or performed during the hospital encounter of 07/23/15  . ED EKG  . ED EKG  . EKG 12-Lead  . EKG 12-Lead    IMPRESSION AND PLAN:  * Small bowel obstruction   Management per surgical team.  * Ac on ch renal failure   IV fluids   Improving, cont to monitor.  * Tachycardia   Likely dehydration   Give metoprolol, IV if needed   n IV fluids  * Hx of stroke   Hold asa and plavix+ statin for now as NPO.  All the records are reviewed and case discussed with ED provider. Management plans discussed with the patient, family and they are in agreement.  CODE STATUS:    Code Status Orders        Start     Ordered   07/23/15 1559  Full code   Continuous     07/23/15 1605    Code Status History    Date Active Date Inactive Code Status Order ID Comments User Context   This patient has a current code status but no historical code status.       TOTAL TIME TAKING CARE OF THIS PATIENT: 35 minutes.   Pt's son in room, explaind plan to him also.  Vaughan Basta M.D on 07/24/2015   Between 7am to 6pm - Pager - [phone]  After 6pm go to [email or web address] - password [email or web address] [facility]  Office  [phone]  CC: Primary care physician; Golden Pop, MD   Note: This dictation was prepared with Dragon dictation along with smaller phrase technology. Any transcriptional errors  that result from this process are unintentional.

## 2015-07-24 NOTE — Progress Notes (Addendum)
Anticoagulation monitoring(Lovenox):  74yo female ordered Lovenox 40 mg Q24h.   Filed Weights   07/23/15 1140  Weight: 90 lb (40.824 kg)    Lab Results  Component Value Date   CREATININE 3.72* 07/24/2015   CREATININE 5.66* 07/23/2015   CREATININE 0.98 07/19/2015   Estimated Creatinine Clearance: 8.4 mL/min (by C-G formula based on Cr of 3.72). Hemoglobin & Hematocrit     Component Value Date/Time   HGB 13.1 07/24/2015 0633   HGB 11.4* 06/06/2013 0232   HCT 40.5 07/24/2015 0633   HCT 39.8 12/23/2014 1115   HCT 36.3 06/06/2013 0232     Per Protocol for Patient with est CrCl < 15 ml/min will transition to Heparin 5000 units SQ Q12H.     Olivia Canter, [facility] Clinical Pharmacist 07/24/2015

## 2015-07-25 LAB — C DIFFICILE QUICK SCREEN W PCR REFLEX
C DIFFICILE (CDIFF) TOXIN: NEGATIVE
C DIFFICLE (CDIFF) ANTIGEN: NEGATIVE
C Diff interpretation: NEGATIVE

## 2015-07-25 LAB — BASIC METABOLIC PANEL
ANION GAP: 6 (ref 5–15)
BUN: 46 mg/dL — ABNORMAL HIGH (ref 6–20)
CALCIUM: 7.6 mg/dL — AB (ref 8.9–10.3)
CHLORIDE: 107 mmol/L (ref 101–111)
CO2: 20 mmol/L — ABNORMAL LOW (ref 22–32)
Creatinine, Ser: 2.26 mg/dL — ABNORMAL HIGH (ref 0.44–1.00)
GFR calc non Af Amer: 20 mL/min — ABNORMAL LOW (ref >60)
GFR, EST AFRICAN AMERICAN: 23 mL/min — AB (ref >60)
Glucose, Bld: 110 mg/dL — ABNORMAL HIGH (ref 65–99)
POTASSIUM: 3.4 mmol/L — AB (ref 3.5–5.1)
Sodium: 133 mmol/L — ABNORMAL LOW (ref 135–145)

## 2015-07-25 LAB — CBC
HEMATOCRIT: 37.4 % (ref 35.0–47.0)
HEMOGLOBIN: 12.2 g/dL (ref 12.0–16.0)
MCH: 28.8 pg (ref 26.0–34.0)
MCHC: 32.6 g/dL (ref 32.0–36.0)
MCV: 88.5 fL (ref 80.0–100.0)
Platelets: 147 10*3/uL — ABNORMAL LOW (ref 150–440)
RBC: 4.23 MIL/uL (ref 3.80–5.20)
RDW: 14.8 % — AB (ref 11.5–14.5)
WBC: 9.1 10*3/uL (ref 3.6–11.0)

## 2015-07-25 LAB — PHOSPHORUS: PHOSPHORUS: 2.5 mg/dL (ref 2.5–4.6)

## 2015-07-25 LAB — MAGNESIUM: Magnesium: 1.4 mg/dL — ABNORMAL LOW (ref 1.7–2.4)

## 2015-07-25 MED ORDER — POTASSIUM CHLORIDE 10 MEQ/100ML IV SOLN
10.0000 meq | INTRAVENOUS | Status: AC
  Administered 2015-07-25 (×2): 10 meq via INTRAVENOUS
  Filled 2015-07-25 (×2): qty 100

## 2015-07-25 MED ORDER — MAGNESIUM SULFATE IN D5W 10-5 MG/ML-% IV SOLN
1.0000 g | Freq: Once | INTRAVENOUS | Status: AC
  Administered 2015-07-25: 1 g via INTRAVENOUS
  Filled 2015-07-25: qty 100

## 2015-07-25 NOTE — Progress Notes (Signed)
[facility] NAME: Melody Green    MR#:  [id number]  DATE OF BIRTH:  10-10-1940  SUBJECTIVE:  CHIEF COMPLAINT:   Chief Complaint  Patient presents with  . Abdominal Pain    Has intestinal obstruction, manage conservatively by surgical team.   Also noted in renal fialure, now improving with IV fluids.   NGT suction on hold today.   Had 2-3 loose BM.  REVIEW OF SYSTEMS:   CONSTITUTIONAL: No fever, fatigue or weakness.  EYES: No blurred or double vision.  EARS, NOSE, AND THROAT: No tinnitus or ear pain.  RESPIRATORY: No cough, shortness of breath, wheezing or hemoptysis.  CARDIOVASCULAR: No chest pain, orthopnea, edema.  GASTROINTESTINAL: positive for nausea, vomiting, no diarrhea or abdominal pain.  GENITOURINARY: No dysuria, hematuria.  ENDOCRINE: No polyuria, nocturia,  HEMATOLOGY: No anemia, easy bruising or bleeding SKIN: No rash or lesion. MUSCULOSKELETAL: No joint pain or arthritis.  NEUROLOGIC: No tingling, numbness, weakness.  PSYCHIATRY: No anxiety or depression.   ROS  DRUG ALLERGIES:   Allergies  Allergen Reactions  . Codeine Nausea And Vomiting  . Morphine And Related Nausea And Vomiting  . Ultram [Tramadol] Itching    VITALS:  Blood pressure 123/61, pulse 76, temperature 98.1 F (36.7 C), temperature source Oral, resp. rate 19, height '5\' 4"'$  (1.626 m), weight 43 kg (94 lb 12.8 oz), SpO2 97 %.  PHYSICAL EXAMINATION:   GENERAL: 75 y.o.-year-old thin patient lying in the bed with no acute distress.  EYES: Pupils equal, round, reactive to light and accommodation. No scleral icterus. Extraocular muscles intact.  HEENT: Head atraumatic, normocephalic. Oropharynx and nasopharynx clear.  NECK: Supple, no jugular venous distention. No thyroid enlargement, no tenderness.  LUNGS: Normal breath sounds bilaterally, no wheezing, rales,rhonchi or crepitation. No use of accessory muscles of  respiration.  CARDIOVASCULAR: S1, S2 normal. No murmurs, rubs, or gallops.  ABDOMEN: Soft, nontender, nondistended. Bowel sounds slugish. No organomegaly or mass. NGT in place-clamped today. EXTREMITIES: No pedal edema, cyanosis, or clubbing.  NEUROLOGIC: Cranial nerves II through XII are intact. Muscle strength 5/5 in all extremities. Sensation intact. Gait not checked.  PSYCHIATRIC: The patient is alert and oriented x 3.  SKIN: No obvious rash, lesion, or ulcer.   Physical Exam LABORATORY PANEL:   CBC  Recent Labs Lab 07/25/15 0532  WBC 9.1  HGB 12.2  HCT 37.4  PLT 147*   ------------------------------------------------------------------------------------------------------------------  Chemistries   Recent Labs Lab 07/23/15 1154  07/25/15 0532  NA 132*  < > 133*  K 5.4*  < > 3.4*  CL 94*  < > 107  CO2 20*  < > 20*  GLUCOSE 86  < > 110*  BUN 64*  < > 46*  CREATININE 5.66*  < > 2.26*  CALCIUM 9.2  < > 7.6*  MG  --   --  1.4*  AST 26  --   --   ALT 12*  --   --   ALKPHOS 56  --   --   BILITOT 1.0  --   --   < > = values in this interval not displayed. ------------------------------------------------------------------------------------------------------------------  Cardiac Enzymes  Recent Labs Lab 07/23/15 1154  TROPONINI 0.04*   ------------------------------------------------------------------------------------------------------------------  RADIOLOGY:  Dg Abd 2 Views  07/24/2015  CLINICAL DATA:  Worsening abdominal pain. EXAM: ABDOMEN - 2 VIEW COMPARISON:  CT of the abdomen and pelvis 07/23/2015 FINDINGS: Enteric catheter is partially visualized, tip slightly past the expected  location of gastroesophageal junction, side hole within the esophagus. There is persistent small bowel obstruction with maximum diameter of dilated thick-walled small bowel loops of 3.2 cm. The colon is decompressed. No evidence of free gas or pneumatosis. No evidence of  organomegaly. Vertebroplasty of T11 and L3 is again seen. Postsurgical clips are seen overlying the mid abdomen, and right upper outer quadrant. IMPRESSION: Persistent small bowel obstruction, with diffuse small bowel wall thickening. Differential diagnosis includes infectious or ischemic causes. Enteric catheter tip slightly past the expected location of the gastroesophageal junction. Advancement may be considered. Electronically Signed   By: Fidela Salisbury M.D.   On: 07/24/2015 10:33    ASSESSMENT AND PLAN:   Active Problems:   SBO (small bowel obstruction) (HCC)   Protein-calorie malnutrition, severe   * Small bowel obstruction  Management per surgical team.   NGT suction on hold today, had 2-3 BM.  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  * Tachycardia  Likely dehydration  Give metoprolol, IV if needed  on IV fluids  * Hx of stroke  Hold asa and plavix+ statin for now as NPO.    All the records are reviewed and case discussed with Care Management/Social Workerr. Management plans discussed with the patient, family and they are in agreement.  CODE STATUS: Full  TOTAL TIME TAKING CARE OF THIS PATIENT: 35 minutes.     POSSIBLE D/C IN 1-2 DAYS, DEPENDING ON CLINICAL CONDITION.   Vaughan Basta M.D on 07/25/2015   Between 7am to 6pm - Pager - [phone]  After 6pm go to [email or web address] - password [email or web address] [facility]  Office  [phone]  CC: Primary care physician; Golden Pop, MD  Note: This dictation was prepared with Dragon dictation along with smaller phrase technology. Any transcriptional errors that result from this process are unintentional.

## 2015-07-25 NOTE — Progress Notes (Signed)
CC: Bowel obstruction Subjective: Patient did well overnight. She denies any nausea or vomiting and states she is she is without any abdominal pain.  Objective: Vital signs in last 24 hours: Temp:  [97.6 F (36.4 C)-98.2 F (36.8 C)] 97.9 F (36.6 C) (01/22 0448) Pulse Rate:  [89-137] 89 (01/22 0448) Resp:  [17] 17 (01/21 2020) BP: (85-107)/(50-94) 104/56 mmHg (01/22 0448) SpO2:  [91 %-94 %] 92 % (01/22 0448) Weight:  [43 kg (94 lb 12.8 oz)] 43 kg (94 lb 12.8 oz) (01/22 0500) Last BM Date: 07/19/15  Intake/Output from previous day: 01/21 0701 - 01/22 0700 In: 2570.5 [I.V.:2360.5; NG/GT:210] Out: 1205 [Urine:675; Emesis/NG output:530] Intake/Output this shift: Total I/O In: 253 [I.V.:253] Out: 500 [Urine:100; Emesis/NG output:400]  Physical exam:  Gen.: No acute distress Chest: Clear to auscultation Heart: Regular rate and rhythm Abdomen: Soft, nontender, nondistended  Lab Results: CBC   Recent Labs  07/24/15 0633 07/25/15 0532  WBC 2.6* 9.1  HGB 13.1 12.2  HCT 40.5 37.4  PLT 167 147*   BMET  Recent Labs  07/24/15 0633 07/25/15 0532  NA 129* 133*  K 3.4* 3.4*  CL 101 107  CO2 20* 20*  GLUCOSE 138* 110*  BUN 54* 46*  CREATININE 3.72* 2.26*  CALCIUM 7.5* 7.6*   PT/INR No results for input(s): LABPROT, INR in the last 72 hours. ABG No results for input(s): PHART, HCO3 in the last 72 hours.  Invalid input(s): PCO2, PO2  Studies/Results: Ct Abdomen Pelvis Wo Contrast  07/23/2015  CLINICAL DATA:  Progressive abdominal pain with nausea and vomiting. EXAM: CT ABDOMEN AND PELVIS WITHOUT CONTRAST TECHNIQUE: Multidetector CT imaging of the abdomen and pelvis was performed following the standard protocol without IV contrast. COMPARISON:  CT scan dated 07/19/2015 FINDINGS: Lower chest: Small hiatal hernia. Minimal scarring at the left lung base, stable. Hepatobiliary: Cholecystectomy. Liver parenchyma and biliary tree are normal. Pancreas: Chronic calcifications  in the pancreatic body, unchanged. No acute abnormalities. Spleen: Tiny calcified granulomas in the spleen.  Otherwise normal. Adrenals/Urinary Tract: Tiny stones in the lower pole of the left kidney. Otherwise normal. Stomach/Bowel: The patient has a small bowel obstruction. The transition point appears to be in the mid pelvis just to the right of midline, probably on image 51 of series 5. Small-bowel distal to this point is normal. The colon is not distended. Ascites in the pelvis appears slightly less prominent. There is a small rectocele containing ascites just to the left of the rectum. Diverticulosis of the sigmoid portion of the colon. 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 Reproductive: Uterus has been removed.  Ovaries are not identified. Other: No free air. Musculoskeletal: No acute abnormality. Old compression fractures of L3 and T11 treated with vertebroplasty. IMPRESSION: Small bowel obstruction in the pelvis, probably proximal ileum. No discrete mass. This is probably due to an adhesion. Small amount of ascites. Critical Value/emergent results were called by telephone at the time of interpretation on 07/23/2015 at 2:32 pm to Dr. Larae Grooms , who verbally acknowledged these results. Electronically Signed   By: Lorriane Shire M.D.   On: 07/23/2015 14:32   Ct Head Wo Contrast  07/23/2015  CLINICAL DATA:  Abdominal distention EXAM: CT HEAD WITHOUT CONTRAST TECHNIQUE: Contiguous axial images were obtained from the base of the skull through the vertex without intravenous contrast. COMPARISON:  01/21/2014 FINDINGS: Skull and Sinuses:Negative for fracture or destructive process. The visualized mastoids, middle ears, and imaged paranasal sinuses are clear. Visualized  orbits: Negative. Brain: No evidence of acute infarction, hemorrhage, hydrocephalus, or mass lesion/mass effect. Extensive chronic small vessel disease with chronic ischemic  gliosis confluent throughout the bilateral cerebral white matter. Remote small vessel infarct in the right caudate head. Remote lacunar infarct versus dilated perivascular space below the right putamen. Normal cerebral volume. IMPRESSION: 1. No acute finding. 2. Advanced chronic microvascular ischemia. Electronically Signed   By: Monte Fantasia M.D.   On: 07/23/2015 14:22   Dg Abd 2 Views  07/24/2015  CLINICAL DATA:  Worsening abdominal pain. EXAM: ABDOMEN - 2 VIEW COMPARISON:  CT of the abdomen and pelvis 07/23/2015 FINDINGS: Enteric catheter is partially visualized, tip slightly past the expected location of gastroesophageal junction, side hole within the esophagus. There is persistent small bowel obstruction with maximum diameter of dilated thick-walled small bowel loops of 3.2 cm. The colon is decompressed. No evidence of free gas or pneumatosis. No evidence of organomegaly. Vertebroplasty of T11 and L3 is again seen. Postsurgical clips are seen overlying the mid abdomen, and right upper outer quadrant. IMPRESSION: Persistent small bowel obstruction, with diffuse small bowel wall thickening. Differential diagnosis includes infectious or ischemic causes. Enteric catheter tip slightly past the expected location of the gastroesophageal junction. Advancement may be considered. Electronically Signed   By: Fidela Salisbury M.D.   On: 07/24/2015 10:33    Anti-infectives: Anti-infectives    None      Assessment/Plan:  75 year old female with a small bowel obstruction. NG output has been decreasing over the last 12 hours. We will perform an NG tube clamp trial this morning to evaluate if her NG tube can be removed. Appreciate internal medicine and nephrology assistance managing this complicated patient.  Sharryn Belding T. Adonis Huguenin, MD, FACS  07/25/2015

## 2015-07-25 NOTE — Progress Notes (Signed)
[facility]  ROUNDING NOTE   Subjective:  NG tube clamped at the moment. Urine output noted as being 675 cc over the past 24 hours. Creatinine currently down to 2.26. Remains on IV fluid hydration. Son at bedside and updated as to patient condition. Did have a bowel movement this a.m.   Objective:  Vital signs in last 24 hours:  Temp:  [97.8 F (36.6 C)-98.2 F (36.8 C)] 97.8 F (36.6 C) (01/22 1249) Pulse Rate:  [83-108] 83 (01/22 1249) Resp:  [17] 17 (01/21 2020) BP: (85-104)/(50-56) 96/54 mmHg (01/22 1249) SpO2:  [91 %-96 %] 96 % (01/22 1249) Weight:  [43 kg (94 lb 12.8 oz)] 43 kg (94 lb 12.8 oz) (01/22 0500)  Weight change: 2.176 kg (4 lb 12.8 oz) Filed Weights   07/23/15 1140 07/25/15 0500  Weight: 40.824 kg (90 lb) 43 kg (94 lb 12.8 oz)    Intake/Output: I/O last 3 completed shifts: In: 4335.5 [I.V.:4035.5; NG/GT:300] Out: 1475 [Urine:675; Emesis/NG output:800]   Intake/Output this shift:  Total I/O In: 253 [I.V.:253] Out: 500 [Urine:100; Emesis/NG output:400]  Physical Exam: General: NAD, resting in bed  Head: Normocephalic, atraumatic. Moist oral mucosal membranes, NG in place  Eyes: Anicteric  Neck: Supple, trachea midline  Lungs:  Clear to auscultation  Heart: Regular rate and rhythm  Abdomen:  Mild distension, scant BS, no tenderness today  Extremities:  no peripheral edema.  Neurologic: Nonfocal, moving all four extremities  Skin: No lesions       Basic Metabolic Panel:  Recent Labs Lab 07/19/15 1422 07/23/15 1154 07/24/15 0633 07/25/15 0532  NA 136 132* 129* 133*  K 3.7 5.4* 3.4* 3.4*  CL 101 94* 101 107  CO2 26 20* 20* 20*  GLUCOSE 104* 86 138* 110*  BUN 10 64* 54* 46*  CREATININE 0.98 5.66* 3.72* 2.26*  CALCIUM 9.0 9.2 7.5* 7.6*  MG  --   --   --  1.4*  PHOS  --   --   --  2.5    Liver Function Tests:  Recent Labs Lab 07/19/15 1422 07/23/15 1154  AST 27 26  ALT 16 12*  ALKPHOS 58 56  BILITOT 0.2* 1.0   PROT 6.8 7.7  ALBUMIN 3.9 4.2    Recent Labs Lab 07/19/15 1422 07/23/15 1154  LIPASE 42 42   No results for input(s): AMMONIA in the last 168 hours.  CBC:  Recent Labs Lab 07/19/15 1422 07/23/15 1154 07/24/15 0633 07/25/15 0532  WBC 7.3 3.3* 2.6* 9.1  HGB 13.7 14.6 13.1 12.2  HCT 41.8 45.1 40.5 37.4  MCV 88.2 86.7 87.5 88.5  PLT 187 196 167 147*    Cardiac Enzymes:  Recent Labs Lab 07/23/15 1154  TROPONINI 0.04*    BNP: Invalid input(s): POCBNP  CBG: No results for input(s): GLUCAP in the last 168 hours.  Microbiology: Results for orders placed or performed in visit on 12/23/14  Microscopic Examination     Status: None   Collection Time: 12/23/14 11:16 AM  Result Value Ref Range Status   WBC, UA 0-5 0 -  5 /hpf Final   RBC, UA 0-2 0 -  2 /hpf Final   Epithelial Cells (non renal) 0-10 0 - 10 /hpf Final   Bacteria, UA Few None seen/Few Final    Coagulation Studies: No results for input(s): LABPROT, INR in the last 72 hours.  Urinalysis:  Recent Labs  07/23/15 1323  COLORURINE YELLOW*  LABSPEC 1.014  PHURINE 5.0  GLUCOSEU 50*  HGBUR 1+*  BILIRUBINUR NEGATIVE  KETONESUR TRACE*  PROTEINUR 100*  NITRITE NEGATIVE  LEUKOCYTESUR NEGATIVE      Imaging: Ct Abdomen Pelvis Wo Contrast  07/23/2015  CLINICAL DATA:  Progressive abdominal pain with nausea and vomiting. EXAM: CT ABDOMEN AND PELVIS WITHOUT CONTRAST TECHNIQUE: Multidetector CT imaging of the abdomen and pelvis was performed following the standard protocol without IV contrast. COMPARISON:  CT scan dated 07/19/2015 FINDINGS: Lower chest: Small hiatal hernia. Minimal scarring at the left lung base, stable. Hepatobiliary: Cholecystectomy. Liver parenchyma and biliary tree are normal. Pancreas: Chronic calcifications in the pancreatic body, unchanged. No acute abnormalities. Spleen: Tiny calcified granulomas in the spleen.  Otherwise normal. Adrenals/Urinary Tract: Tiny stones in the lower pole of  the left kidney. Otherwise normal. Stomach/Bowel: The patient has a small bowel obstruction. The transition point appears to be in the mid pelvis just to the right of midline, probably on image 51 of series 5. Small-bowel distal to this point is normal. The colon is not distended. Ascites in the pelvis appears slightly less prominent. There is a small rectocele containing ascites just to the left of the rectum. Diverticulosis of the sigmoid portion of the colon. 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 Reproductive: Uterus has been removed.  Ovaries are not identified. Other: No free air. Musculoskeletal: No acute abnormality. Old compression fractures of L3 and T11 treated with vertebroplasty. IMPRESSION: Small bowel obstruction in the pelvis, probably proximal ileum. No discrete mass. This is probably due to an adhesion. Small amount of ascites. Critical Value/emergent results were called by telephone at the time of interpretation on 07/23/2015 at 2:32 pm to Dr. Larae Grooms , who verbally acknowledged these results. Electronically Signed   By: Lorriane Shire M.D.   On: 07/23/2015 14:32   Ct Head Wo Contrast  07/23/2015  CLINICAL DATA:  Abdominal distention EXAM: CT HEAD WITHOUT CONTRAST TECHNIQUE: Contiguous axial images were obtained from the base of the skull through the vertex without intravenous contrast. COMPARISON:  01/21/2014 FINDINGS: Skull and Sinuses:Negative for fracture or destructive process. The visualized mastoids, middle ears, and imaged paranasal sinuses are clear. Visualized orbits: Negative. Brain: No evidence of acute infarction, hemorrhage, hydrocephalus, or mass lesion/mass effect. Extensive chronic small vessel disease with chronic ischemic gliosis confluent throughout the bilateral cerebral white matter. Remote small vessel infarct in the right caudate head. Remote lacunar infarct versus dilated perivascular space below  the right putamen. Normal cerebral volume. IMPRESSION: 1. No acute finding. 2. Advanced chronic microvascular ischemia. Electronically Signed   By: Monte Fantasia M.D.   On: 07/23/2015 14:22   Dg Abd 2 Views  07/24/2015  CLINICAL DATA:  Worsening abdominal pain. EXAM: ABDOMEN - 2 VIEW COMPARISON:  CT of the abdomen and pelvis 07/23/2015 FINDINGS: Enteric catheter is partially visualized, tip slightly past the expected location of gastroesophageal junction, side hole within the esophagus. There is persistent small bowel obstruction with maximum diameter of dilated thick-walled small bowel loops of 3.2 cm. The colon is decompressed. No evidence of free gas or pneumatosis. No evidence of organomegaly. Vertebroplasty of T11 and L3 is again seen. Postsurgical clips are seen overlying the mid abdomen, and right upper outer quadrant. IMPRESSION: Persistent small bowel obstruction, with diffuse small bowel wall thickening. Differential diagnosis includes infectious or ischemic causes. Enteric catheter tip slightly past the expected location of the gastroesophageal junction. Advancement may be considered. Electronically Signed   By: Fidela Salisbury M.D.   On: 07/24/2015 10:33  Medications:   . dextrose 5 % and 0.9% NaCl 100 mL/hr at 07/25/15 1125   . heparin subcutaneous  5,000 Units Subcutaneous Q12H  . pantoprazole (PROTONIX) IV  40 mg Intravenous QHS  . potassium chloride  10 mEq Intravenous Q1 Hr x 2   acetaminophen **OR** acetaminophen, HYDROmorphone (DILAUDID) injection, metoprolol, ondansetron **OR** ondansetron (ZOFRAN) IV, phenol  Assessment/ Plan:  75 y.o. female  with a PMHx of depression, degenerative disc disease, osteoporosis, history of abdominal cavity hernia, vitamin B12 deficiency, collagenous colitis, hyperlipidemia, tobacco abuse, CKD stage III baseline Cr 0.98/EGFR 55 who was admitted to [facility] on 07/23/2015 for evaluation of abdominal pain, nausea, and vomiting. Presents with  high grade small bowel obstruction.  1. Acute renal failure related to poor po intake, nausea, vomiting, obstruction. 2. CKD stage III baseline Cr 0.98 with egfr of 55. 3. Hyponatremia, hypovolemic in nature. 4. Small bowel obstruction, s/p NG decompression, surgery following.  Plan:  The patient's abdominal exam certainly appears to have improved. There is less distention today. She still has some feculent NG drainage. However she's had several bowel movements this morning. Overall her renal function has improved. Creatinine down to 2.2 and urine output noted as being 875 cc over the preceding 24 hours. Therefore we recommend continuation of IV fluid hydration. NG tube has been clamped today. Surgery following closely.  Continue to monitor renal function, sodium, and urine output.   LOS: 2 Gladine Plude 1/22/20171:54 PM

## 2015-07-26 LAB — BASIC METABOLIC PANEL
ANION GAP: 6 (ref 5–15)
BUN: 29 mg/dL — ABNORMAL HIGH (ref 6–20)
CHLORIDE: 115 mmol/L — AB (ref 101–111)
CO2: 17 mmol/L — ABNORMAL LOW (ref 22–32)
CREATININE: 1.34 mg/dL — AB (ref 0.44–1.00)
Calcium: 8 mg/dL — ABNORMAL LOW (ref 8.9–10.3)
GFR calc Af Amer: 44 mL/min — ABNORMAL LOW (ref >60)
GFR calc non Af Amer: 38 mL/min — ABNORMAL LOW (ref >60)
Glucose, Bld: 99 mg/dL (ref 65–99)
Potassium: 3.3 mmol/L — ABNORMAL LOW (ref 3.5–5.1)
SODIUM: 138 mmol/L (ref 135–145)

## 2015-07-26 LAB — CBC
HEMATOCRIT: 41 % (ref 35.0–47.0)
HEMOGLOBIN: 13.2 g/dL (ref 12.0–16.0)
MCH: 28.8 pg (ref 26.0–34.0)
MCHC: 32.2 g/dL (ref 32.0–36.0)
MCV: 89.5 fL (ref 80.0–100.0)
Platelets: 172 10*3/uL (ref 150–440)
RBC: 4.58 MIL/uL (ref 3.80–5.20)
RDW: 15.1 % — ABNORMAL HIGH (ref 11.5–14.5)
WBC: 13.9 10*3/uL — AB (ref 3.6–11.0)

## 2015-07-26 LAB — MAGNESIUM: MAGNESIUM: 1.6 mg/dL — AB (ref 1.7–2.4)

## 2015-07-26 LAB — PHOSPHORUS: PHOSPHORUS: 1.9 mg/dL — AB (ref 2.5–4.6)

## 2015-07-26 MED ORDER — CLOPIDOGREL BISULFATE 75 MG PO TABS
75.0000 mg | ORAL_TABLET | Freq: Every day | ORAL | Status: DC
  Administered 2015-07-26 – 2015-07-27 (×2): 75 mg via ORAL
  Filled 2015-07-26 (×2): qty 1

## 2015-07-26 MED ORDER — POTASSIUM CHLORIDE CRYS ER 20 MEQ PO TBCR
20.0000 meq | EXTENDED_RELEASE_TABLET | Freq: Two times a day (BID) | ORAL | Status: DC
  Administered 2015-07-26 – 2015-07-27 (×3): 20 meq via ORAL
  Filled 2015-07-26 (×3): qty 1

## 2015-07-26 MED ORDER — K PHOS MONO-SOD PHOS DI & MONO 155-852-130 MG PO TABS
500.0000 mg | ORAL_TABLET | Freq: Two times a day (BID) | ORAL | Status: DC
  Administered 2015-07-26 – 2015-07-27 (×2): 500 mg via ORAL
  Filled 2015-07-26 (×5): qty 2

## 2015-07-26 MED ORDER — ATORVASTATIN CALCIUM 20 MG PO TABS
20.0000 mg | ORAL_TABLET | Freq: Every day | ORAL | Status: DC
  Administered 2015-07-26: 20 mg via ORAL
  Filled 2015-07-26: qty 1

## 2015-07-26 MED ORDER — MAGNESIUM SULFATE 2 GM/50ML IV SOLN
2.0000 g | Freq: Once | INTRAVENOUS | Status: AC
  Administered 2015-07-26: 2 g via INTRAVENOUS
  Filled 2015-07-26: qty 50

## 2015-07-26 NOTE — Progress Notes (Signed)
[facility]  ROUNDING NOTE   Subjective:   Advancing diet. She is eating more.  Creatinine 1.34 (2.26)  Objective:  Vital signs in last 24 hours:  Temp:  [97.8 F (36.6 C)-98.6 F (37 C)] 98.3 F (36.8 C) (01/23 0800) Pulse Rate:  [72-83] 72 (01/23 0800) Resp:  [18-21] 21 (01/23 0800) BP: (96-140)/(54-76) 140/76 mmHg (01/23 0800) SpO2:  [96 %-97 %] 97 % (01/23 0800) Weight:  [42.91 kg (94 lb 9.6 oz)] 42.91 kg (94 lb 9.6 oz) (01/23 0500)  Weight change: -0.09 kg (-3.2 oz) Filed Weights   07/23/15 1140 07/25/15 0500 07/26/15 0500  Weight: 40.824 kg (90 lb) 43 kg (94 lb 12.8 oz) 42.91 kg (94 lb 9.6 oz)    Intake/Output: I/O last 3 completed shifts: In: 9826 [P.O.:100; I.V.:2914; NG/GT:120] Out: 975 [Urine:275; Emesis/NG output:700]   Intake/Output this shift:  Total I/O In: 1031 [I.V.:1031] Out: -   Physical Exam: General: NAD, laying in bed  Head: Normocephalic, atraumatic. Moist oral mucosal membranes  Eyes: Anicteric  Neck: Supple, trachea midline  Lungs:  Clear to auscultation  Heart: Regular rate and rhythm  Abdomen:  Soft, nontender, +bowel sounds  Extremities:  no peripheral edema.  Neurologic: Nonfocal, moving all four extremities  Skin: No lesions       Basic Metabolic Panel:  Recent Labs Lab 07/19/15 1422 07/23/15 1154 07/24/15 0633 07/25/15 0532 07/26/15 0548  NA 136 132* 129* 133* 138  K 3.7 5.4* 3.4* 3.4* 3.3*  CL 101 94* 101 107 115*  CO2 26 20* 20* 20* 17*  GLUCOSE 104* 86 138* 110* 99  BUN 10 64* 54* 46* 29*  CREATININE 0.98 5.66* 3.72* 2.26* 1.34*  CALCIUM 9.0 9.2 7.5* 7.6* 8.0*  MG  --   --   --  1.4* 1.6*  PHOS  --   --   --  2.5 1.9*    Liver Function Tests:  Recent Labs Lab 07/19/15 1422 07/23/15 1154  AST 27 26  ALT 16 12*  ALKPHOS 58 56  BILITOT 0.2* 1.0  PROT 6.8 7.7  ALBUMIN 3.9 4.2    Recent Labs Lab 07/19/15 1422 07/23/15 1154  LIPASE 42 42   No results for input(s): AMMONIA in the last  168 hours.  CBC:  Recent Labs Lab 07/19/15 1422 07/23/15 1154 07/24/15 0633 07/25/15 0532 07/26/15 0548  WBC 7.3 3.3* 2.6* 9.1 13.9*  HGB 13.7 14.6 13.1 12.2 13.2  HCT 41.8 45.1 40.5 37.4 41.0  MCV 88.2 86.7 87.5 88.5 89.5  PLT 187 196 167 147* 172    Cardiac Enzymes:  Recent Labs Lab 07/23/15 1154  TROPONINI 0.04*    BNP: Invalid input(s): POCBNP  CBG: No results for input(s): GLUCAP in the last 168 hours.  Microbiology: Results for orders placed or performed during the hospital encounter of 07/23/15  C difficile quick scan w PCR reflex     Status: None   Collection Time: 07/25/15  1:15 PM  Result Value Ref Range Status   C Diff antigen NEGATIVE NEGATIVE Final   C Diff toxin NEGATIVE NEGATIVE Final   C Diff interpretation Negative for C. difficile  Final    Coagulation Studies: No results for input(s): LABPROT, INR in the last 72 hours.  Urinalysis:  Recent Labs  07/23/15 1323  COLORURINE YELLOW*  LABSPEC 1.014  PHURINE 5.0  GLUCOSEU 50*  HGBUR 1+*  BILIRUBINUR NEGATIVE  KETONESUR TRACE*  PROTEINUR 100*  NITRITE NEGATIVE  LEUKOCYTESUR NEGATIVE  Imaging: No results found.   Medications:   . dextrose 5 % and 0.9% NaCl 100 mL/hr at 07/26/15 0757   . atorvastatin  20 mg Oral q1800  . clopidogrel  75 mg Oral Daily  . heparin subcutaneous  5,000 Units Subcutaneous Q12H  . pantoprazole (PROTONIX) IV  40 mg Intravenous QHS  . potassium chloride  20 mEq Oral BID   acetaminophen **OR** acetaminophen, HYDROmorphone (DILAUDID) injection, metoprolol, ondansetron **OR** ondansetron (ZOFRAN) IV, phenol  Assessment/ Plan:  75 y.o. white female  with depression, degenerative disc disease, osteoporosis, history of abdominal cavity hernia, vitamin B12 deficiency, collagenous colitis, hyperlipidemia, tobacco abuse, CKD stage III baseline Cr 0.98/EGFR 55 who was admitted to [facility] on 07/23/2015 for evaluation of abdominal pain, nausea, and vomiting.  Presents with small bowel obstruction.  1. Acute renal failure on chronic kidney disease stage III: baseline eGFR of 55. Acute renal failure from prerenal azotemia. Improved with IV fluids and PO intake.  - Continue IV fluids, change to NS at 50. - Encourage PO intake.  - Renally dose all medications. Avoid nephrotoxic agents. Has a history of ibuprofen use.   2. Hyponatremia: improved to 138. Secondary to hypovolemia. Improved with saline infusion.  - Continue to monitor.   3. Hypertension with history of CVA. blood pressures at goal.  - currently off all blood pressure agents.    LOS: 3 Melody Green 1/23/201711:48 AM

## 2015-07-26 NOTE — Progress Notes (Signed)
[facility] [place] NAME: Melody Green    MR#:  [id number]  DATE OF BIRTH:  1941/03/29  SUBJECTIVE:  CHIEF COMPLAINT:   Chief Complaint  Patient presents with  . Abdominal Pain    Has intestinal obstruction, manage conservatively by surgical team.   Also noted in renal fialure, now improving with IV fluids.   NGT suction done initially, now removed NGT and on diet.   Had 2-3 loose BM.  REVIEW OF SYSTEMS:   CONSTITUTIONAL: No fever, fatigue or weakness.  EYES: No blurred or double vision.  EARS, NOSE, AND THROAT: No tinnitus or ear pain.  RESPIRATORY: No cough, shortness of breath, wheezing or hemoptysis.  CARDIOVASCULAR: No chest pain, orthopnea, edema.  GASTROINTESTINAL: positive for nausea, vomiting, no diarrhea or abdominal pain. GENITOURINARY: No dysuria, hematuria.  ENDOCRINE: No polyuria, nocturia,  HEMATOLOGY: No anemia, easy bruising or bleeding SKIN: No rash or lesion. MUSCULOSKELETAL: No joint pain or arthritis.  NEUROLOGIC: No tingling, numbness, weakness.  PSYCHIATRY: No anxiety or depression.   ROS  DRUG ALLERGIES:   Allergies  Allergen Reactions  . Codeine Nausea And Vomiting  . Morphine And Related Nausea And Vomiting  . Ultram [Tramadol] Itching    VITALS:  Blood pressure 140/76, pulse 83, temperature 99.3 F (37.4 C), temperature source Oral, resp. rate 18, height '5\' 4"'$  (1.626 m), weight 42.91 kg (94 lb 9.6 oz), SpO2 97 %.  PHYSICAL EXAMINATION:   GENERAL: 75 y.o.-year-old thin patient lying in the bed with no acute distress.  EYES: Pupils equal, round, reactive to light and accommodation. No scleral icterus. Extraocular muscles intact.  HEENT: Head atraumatic, normocephalic. Oropharynx and nasopharynx clear.  NECK: Supple, no jugular venous distention. No thyroid enlargement, no tenderness.  LUNGS: Normal breath sounds bilaterally, no wheezing, rales,rhonchi or crepitation. No use  of accessory muscles of respiration.  CARDIOVASCULAR: S1, S2 normal. No murmurs, rubs, or gallops.  ABDOMEN: Soft, nontender, nondistended. Bowel sounds normal. No organomegaly or mass.  EXTREMITIES: No pedal edema, cyanosis, or clubbing.  NEUROLOGIC: Cranial nerves II through XII are intact. Muscle strength 5/5 in all extremities. Sensation intact. Gait not checked.  PSYCHIATRIC: The patient is alert and oriented x 3.  SKIN: No obvious rash, lesion, or ulcer.   Physical Exam LABORATORY PANEL:   CBC  Recent Labs Lab 07/26/15 0548  WBC 13.9*  HGB 13.2  HCT 41.0  PLT 172   ------------------------------------------------------------------------------------------------------------------  Chemistries   Recent Labs Lab 07/23/15 1154  07/26/15 0548  NA 132*  < > 138  K 5.4*  < > 3.3*  CL 94*  < > 115*  CO2 20*  < > 17*  GLUCOSE 86  < > 99  BUN 64*  < > 29*  CREATININE 5.66*  < > 1.34*  CALCIUM 9.2  < > 8.0*  MG  --   < > 1.6*  AST 26  --   --   ALT 12*  --   --   ALKPHOS 56  --   --   BILITOT 1.0  --   --   < > = values in this interval not displayed. ------------------------------------------------------------------------------------------------------------------  Cardiac Enzymes  Recent Labs Lab 07/23/15 1154  TROPONINI 0.04*   ------------------------------------------------------------------------------------------------------------------  RADIOLOGY:  No results found.  ASSESSMENT AND PLAN:   Active Problems:   SBO (small bowel obstruction) (HCC)   Protein-calorie malnutrition, severe   * Small bowel obstruction  Management per surgical team.   NGT  suction was done, now NGT out, and started on diet.   Has loose stools, C diff negative.  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    Appreciated help by nephrology.  * hyponatremia   Resolving with IV fluids.  * Tachycardia  Likely dehydration  Give metoprolol,  IV if needed  on IV fluids  * Hx of stroke  Hold asa and plavix+ statin for now as NPO.  * Hypertension   BP stable.  * Hypokalemia, hypomagnesemia   Replacing electrolytes.    All the records are reviewed and case discussed with Care Management/Social Workerr. Management plans discussed with the patient, family and they are in agreement.  CODE STATUS: Full  TOTAL TIME TAKING CARE OF THIS PATIENT: 35 minutes.     POSSIBLE D/C IN 1-2 DAYS, DEPENDING ON CLINICAL CONDITION.   Vaughan Basta M.D on 07/26/2015   Between 7am to 6pm - Pager - [phone]  After 6pm go to [email or web address] - password [email or web address] [facility]  Office  [phone]  CC: Primary care physician; Golden Pop, MD  Note: This dictation was prepared with Dragon dictation along with smaller phrase technology. Any transcriptional errors that result from this process are unintentional.

## 2015-07-26 NOTE — Care Management Important Message (Signed)
Important Message  Patient Details  Name: Melody Green MRN: [id number] Date of Birth: [birth year]   Medicare Important Message Given:  Yes    Juliann Pulse A Chelesa Weingartner 07/26/2015, 10:37 AM

## 2015-07-26 NOTE — Plan of Care (Signed)
Problem: Pain Managment: Goal: General experience of comfort will improve Outcome: Progressing Pt has complained of no pain or discomfort during my care.  Problem: Bowel/Gastric: Goal: Will not experience complications related to bowel motility Outcome: Progressing Pt has had two bowel movements during my care.   Comments:  Pt has been sitting in the chair since the beginning of my shift. She has been rounded on during the morning for toileting and other personal needs. Pt has had no compaints of pain during my care. She continues to have a clear liquid diet with no complaints of nausea or vomiting. Will decrease IVF, as ordered by physician, and continue to monitor.

## 2015-07-26 NOTE — Progress Notes (Signed)
CC: Small bowel obstruction Subjective: Patient had an uneventful night. She tolerated removal of her NG tube yesterday without difficulty. She has been tolerating her clear liquid diet without any nausea or vomiting. She has continued to have multiple bouts of diarrhea but has had a negative C. difficile.  Objective: Vital signs in last 24 hours: Temp:  [97.8 F (36.6 C)-98.6 F (37 C)] 98.3 F (36.8 C) (01/23 0800) Pulse Rate:  [72-83] 72 (01/23 0800) Resp:  [18-21] 21 (01/23 0800) BP: (96-140)/(54-76) 140/76 mmHg (01/23 0800) SpO2:  [96 %-97 %] 97 % (01/23 0800) Weight:  [42.91 kg (94 lb 9.6 oz)] 42.91 kg (94 lb 9.6 oz) (01/23 0500) Last BM Date: 07/25/15  Intake/Output from previous day: 01/22 0701 - 01/23 0700 In: 1586 [P.O.:100; I.V.:1486] Out: 500 [Urine:100; Emesis/NG output:400] Intake/Output this shift: Total I/O In: 1031 [I.V.:1031] Out: -   Physical exam:  Gen.: No acute distress Chest: Clear to auscultation Heart: Regular rate and rhythm Abdomen: Soft, nontender, nondistended.  Lab Results: CBC   Recent Labs  07/25/15 0532 07/26/15 0548  WBC 9.1 13.9*  HGB 12.2 13.2  HCT 37.4 41.0  PLT 147* 172   BMET  Recent Labs  07/25/15 0532 07/26/15 0548  NA 133* 138  K 3.4* 3.3*  CL 107 115*  CO2 20* 17*  GLUCOSE 110* 99  BUN 46* 29*  CREATININE 2.26* 1.34*  CALCIUM 7.6* 8.0*   PT/INR No results for input(s): LABPROT, INR in the last 72 hours. ABG No results for input(s): PHART, HCO3 in the last 72 hours.  Invalid input(s): PCO2, PO2  Studies/Results: No results found.  Anti-infectives: Anti-infectives    None      Assessment/Plan:  75 year old female who appears to have a resolved small bowel traction. Now having diarrhea. This is possibly a post obstruction diarrhea as her C. difficile testing was negative. Plan to advance diet to regular today. Appreciate internal medicine and nephrology assistance with this patient and her chronic  medical problems.  Naveyah Iacovelli T. Adonis Huguenin, MD, FACS  07/26/2015

## 2015-07-27 DIAGNOSIS — K5669 Other intestinal obstruction: Secondary | ICD-10-CM

## 2015-07-27 DIAGNOSIS — Z8719 Personal history of other diseases of the digestive system: Secondary | ICD-10-CM | POA: Insufficient documentation

## 2015-07-27 LAB — BASIC METABOLIC PANEL
ANION GAP: 4 — AB (ref 5–15)
BUN: 15 mg/dL (ref 6–20)
CALCIUM: 7.6 mg/dL — AB (ref 8.9–10.3)
CO2: 17 mmol/L — AB (ref 22–32)
Chloride: 115 mmol/L — ABNORMAL HIGH (ref 101–111)
Creatinine, Ser: 0.88 mg/dL (ref 0.44–1.00)
GFR calc non Af Amer: >60 mL/min (ref >60)
Glucose, Bld: 100 mg/dL — ABNORMAL HIGH (ref 65–99)
Potassium: 3.5 mmol/L (ref 3.5–5.1)
Sodium: 136 mmol/L (ref 135–145)

## 2015-07-27 NOTE — Discharge Summary (Signed)
Patient ID: Melody Green MRN: [id number] DOB/AGE: 1940/11/17 75 y.o.  Admit date: 07/23/2015 Discharge date: 07/27/2015  Discharge Diagnoses:  Small bowel obstruction  Procedures Performed: NG tube decompression  Discharged Condition: good  Hospital Course: Patient admitted from the emergency department with a diagnosis of a small bowel obstruction. Patient had gradual improvement in bowel function after NG tube decompression. Prior to discharge she was able tolerate a regular diet and having normal bowel function.  Discharge Orders:  discharge home   Disposition: 01-Home or Self Care  Discharge Medications:    Medication List    TAKE these medications        atorvastatin 20 MG tablet  Commonly known as:  LIPITOR  Take 20 mg by mouth at bedtime.     calcium carbonate 600 MG Tabs tablet  Commonly known as:  OS-CAL  Take 600 mg by mouth at bedtime.     citalopram 10 MG tablet  Commonly known as:  CELEXA  Take 10 mg by mouth at bedtime.     clopidogrel 75 MG tablet  Commonly known as:  PLAVIX  Take 75 mg by mouth at bedtime.     cyanocobalamin 1000 MCG/ML injection  Commonly known as:  (VITAMIN B-12)  Inject 1,000 mcg into the muscle every 30 (thirty) days.     ibuprofen 400 MG tablet  Commonly known as:  ADVIL,MOTRIN  Take 400 mg by mouth every 6 (six) hours as needed for mild pain.     levothyroxine 50 MCG tablet  Commonly known as:  SYNTHROID, LEVOTHROID  Take 50 mcg by mouth every other day. Pt alternates with 9mg.     levothyroxine 75 MCG tablet  Commonly known as:  SYNTHROID, LEVOTHROID  Take 75 mcg by mouth every other day. Pt alternates with 560m.     LORazepam 1 MG tablet  Commonly known as:  ATIVAN  Take 1-2 mg by mouth daily as needed for anxiety or sleep.     mometasone 50 MCG/ACT nasal spray  Commonly known as:  NASONEX  Place 2 sprays into the nose daily as needed (for rhinitis).     omeprazole 20 MG capsule  Commonly known as:   PRILOSEC  Take 20 mg by mouth at bedtime.     ondansetron 4 MG disintegrating tablet  Commonly known as:  ZOFRAN ODT  Take 1 tablet (4 mg total) by mouth every 8 (eight) hours as needed for nausea or vomiting.     triamcinolone cream 0.1 %  Commonly known as:  KENALOG  Apply 1 application topically 2 (two) times daily.         Follwup: Follow-up Information    Follow up with MaGolden PopMD. Schedule an appointment as soon as possible for a visit in 2 weeks.   Specialty:  Family Medicine   Why:  hospital follow up   Contact information:   [phone]     Follow up with ELKyleSchedule an appointment as soon as possible for a visit in 1 week.   Specialty:  General Surgery   Why:  hospital follow up   Contact information:   [phone]    Signed: ChClayburn Pert/24/2017, 8:28 AM

## 2015-07-27 NOTE — Final Progress Note (Signed)
CC: Bowel obstruction Subjective: Patient did well overnight. Denies any fevers, chills, nausea, vomiting, diarrhea. Tolerating regular diet.  Objective: Vital signs in last 24 hours: Temp:  [97.9 F (36.6 C)-99.3 F (37.4 C)] 98.2 F (36.8 C) (01/24 0426) Pulse Rate:  [70-83] 75 (01/24 0426) Resp:  [17-24] 17 (01/24 0426) BP: (132-175)/(66-89) 140/78 mmHg (01/24 0437) SpO2:  [95 %-98 %] 95 % (01/24 0426) Weight:  [43.046 kg (94 lb 14.4 oz)] 43.046 kg (94 lb 14.4 oz) (01/24 0500) Last BM Date: 07/26/15  Intake/Output from previous day: 01/23 0701 - 01/24 0700 In: 2492 [P.O.:240; I.V.:2252] Out: 1000 [Urine:1000] Intake/Output this shift: Total I/O In: 191.3 [I.V.:191.3] Out: -   Physical exam:  Gen.: No acute distress  chest: Clear to auscultation Heart: Regular rate and rhythm Abdomen: Soft, nontender, nondistended  Lab Results: CBC   Recent Labs  07/25/15 0532 07/26/15 0548  WBC 9.1 13.9*  HGB 12.2 13.2  HCT 37.4 41.0  PLT 147* 172   BMET  Recent Labs  07/26/15 0548 07/27/15 0522  NA 138 136  K 3.3* 3.5  CL 115* 115*  CO2 17* 17*  GLUCOSE 99 100*  BUN 29* 15  CREATININE 1.34* 0.88  CALCIUM 8.0* 7.6*   PT/INR No results for input(s): LABPROT, INR in the last 72 hours. ABG No results for input(s): PHART, HCO3 in the last 72 hours.  Invalid input(s): PCO2, PO2  Studies/Results: No results found.  Anti-infectives: Anti-infectives    None      Assessment/Plan:  75 year old female who is admitted with a small bowel obstruction that appears to have completely resolved. Patient also had acute renal insufficiency that has also completely resolved. The diarrhea she had yesterday has stopped and she has been having normal bowel function since. Tolerating regular diet. She desires to go home. We will discharge home with follow-up with primary care and surgery in the next 1-2 weeks.  Mekiyah Gladwell T. Adonis Huguenin, MD, FACS  07/27/2015

## 2015-07-27 NOTE — Discharge Instructions (Signed)
Small Bowel Obstruction A small bowel obstruction means that something is blocking the small bowel. The small bowel is also called the small intestine. It is the long tube that connects the stomach to the colon. An obstruction will stop food and fluids from passing through the small bowel. Treatment depends on what is causing the problem and how bad the pr Chronic Kidney Disease Chronic kidney disease happens when the kidneys are damaged over a long period. The kidneys are two organs that do many important jobs in the body. These jobs include:  Removing wastes and extra fluids from the blood.  Making hormones that help to keep the body healthy.  Making sure that the body has the right amount of fluids and chemicals. Chronic kidney disease may be caused by many things. The kidney damage occurs slowly. If too much damage occurs, the kidneys may stop working the way that they should. This is dangerous. Treatment can help to slow down the damage and keep it from getting worse. HOME CARE  Follow your diet as told by your doctor. You may need to limit the amount of salt (sodium) and protein that you eat each day.  Take medicines only as told by your doctor. Do not take any new medicines unless your doctor approves it.  Quit smoking if you smoke. Talk to your doctor about programs that may help you quit smoking.  Have your blood pressure checked regularly and keep track of the results.  Start or keep doing an exercise plan.  Get shots (immunizations) as told by your doctor.  Take vitamins and minerals as told by your doctor.  Keep all follow-up visits as told by your doctor. This is important. GET HELP RIGHT AWAY IF:   Your symptoms get worse.  You have new symptoms.  You have symptoms of end-stage kidney disease. These include:  Headaches.  Skin that is darker or lighter than normal.  Numbness in the hands or feet.  Easy bruising.  Frequent hiccups.  Stopping of menstrual  periods in women.  You have a fever.  You are making very little pee (urine).  You have pain or bleeding when you pee.   This information is not intended to replace advice given to you by your health care provider. Make sure you discuss any questions you have with your health care provider.   Document Released: 09/13/2009 Document Revised: 03/10/2015 Document Reviewed: 02/16/2012 Elsevier Interactive Patient Education 2016 [employer]. oblem is. HOME CARE  Get a lot of rest.  Follow your diet as told by your doctor. You may need to:  Only drink clear liquids until you start to get better.  Avoid solid foods as told by your doctor.  Take over-the-counter and prescription medicines only as told by your doctor.  Keep all follow-up visits as told by your doctor. This is important. GET HELP IF:  You have a fever.  You have chills. GET HELP RIGHT AWAY IF:  You have pain or cramps that get worse.  You throw up (vomit) blood.  You have a feeling of being sick to your stomach (nausea) that does not go away.  You cannot stop throwing up.  You cannot drink fluids.  You feel confused.  You feel dry or thirsty (dehydrated).  Your belly gets more bloated.  You feel weak or you pass out (faint).   This information is not intended to replace advice given to you by your health care provider. Make sure you discuss any questions you  have with your health care provider.   Document Released: 07/27/2004 Document Revised: 03/10/2015 Document Reviewed: 08/13/2014 Elsevier Interactive Patient Education [employer].

## 2015-07-27 NOTE — Progress Notes (Signed)
[facility] [place] NAME: Melody Green    MR#:  [id number]  DATE OF BIRTH:  1940-07-16  SUBJECTIVE:  CHIEF COMPLAINT:   Chief Complaint  Patient presents with  . Abdominal Pain    Has intestinal obstruction, manage conservatively by surgical team.   Also noted in renal fialure, now improving with IV fluids.   NGT suction done initially, now removed NGT and on diet.  tolerated diet, and No more BM since yesterday.  REVIEW OF SYSTEMS:   CONSTITUTIONAL: No fever, fatigue or weakness.  EYES: No blurred or double vision.  EARS, NOSE, AND THROAT: No tinnitus or ear pain.  RESPIRATORY: No cough, shortness of breath, wheezing or hemoptysis.  CARDIOVASCULAR: No chest pain, orthopnea, edema.  GASTROINTESTINAL: no for nausea, vomiting, no diarrhea or abdominal pain. GENITOURINARY: No dysuria, hematuria.  ENDOCRINE: No polyuria, nocturia,  HEMATOLOGY: No anemia, easy bruising or bleeding SKIN: No rash or lesion. MUSCULOSKELETAL: No joint pain or arthritis.  NEUROLOGIC: No tingling, numbness, weakness.  PSYCHIATRY: No anxiety or depression.   ROS  DRUG ALLERGIES:   Allergies  Allergen Reactions  . Codeine Nausea And Vomiting  . Morphine And Related Nausea And Vomiting  . Ultram [Tramadol] Itching    VITALS:  Blood pressure 148/81, pulse 77, temperature 98.4 F (36.9 C), temperature source Oral, resp. rate 18, height '5\' 4"'$  (1.626 m), weight 43.046 kg (94 lb 14.4 oz), SpO2 95 %.  PHYSICAL EXAMINATION:   GENERAL: 75 y.o.-year-old thin patient lying in the bed with no acute distress.  EYES: Pupils equal, round, reactive to light and accommodation. No scleral icterus. Extraocular muscles intact.  HEENT: Head atraumatic, normocephalic. Oropharynx and nasopharynx clear.  NECK: Supple, no jugular venous distention. No thyroid enlargement, no tenderness.  LUNGS: Normal breath sounds bilaterally, no wheezing,  rales,rhonchi or crepitation. No use of accessory muscles of respiration.  CARDIOVASCULAR: S1, S2 normal. No murmurs, rubs, or gallops.  ABDOMEN: Soft, nontender, nondistended. Bowel sounds normal. No organomegaly or mass.  EXTREMITIES: No pedal edema, cyanosis, or clubbing.  NEUROLOGIC: Cranial nerves II through XII are intact. Muscle strength 5/5 in all extremities. Sensation intact. Gait not checked.  PSYCHIATRIC: The patient is alert and oriented x 3.  SKIN: No obvious rash, lesion, or ulcer.   Physical Exam LABORATORY PANEL:   CBC  Recent Labs Lab 07/26/15 0548  WBC 13.9*  HGB 13.2  HCT 41.0  PLT 172   ------------------------------------------------------------------------------------------------------------------  Chemistries   Recent Labs Lab 07/23/15 1154  07/26/15 0548 07/27/15 0522  NA 132*  < > 138 136  K 5.4*  < > 3.3* 3.5  CL 94*  < > 115* 115*  CO2 20*  < > 17* 17*  GLUCOSE 86  < > 99 100*  BUN 64*  < > 29* 15  CREATININE 5.66*  < > 1.34* 0.88  CALCIUM 9.2  < > 8.0* 7.6*  MG  --   < > 1.6*  --   AST 26  --   --   --   ALT 12*  --   --   --   ALKPHOS 56  --   --   --   BILITOT 1.0  --   --   --   < > = values in this interval not displayed. ------------------------------------------------------------------------------------------------------------------  Cardiac Enzymes  Recent Labs Lab 07/23/15 1154  TROPONINI 0.04*   ------------------------------------------------------------------------------------------------------------------  RADIOLOGY:  No results found.  ASSESSMENT AND PLAN:  Active Problems:   SBO (small bowel obstruction) (HCC)   Protein-calorie malnutrition, severe   * Small bowel obstruction  Management per surgical team.   NGT suction was done, now NGT out, and started on diet.   Has loose stools, C diff negative.   No more BM in last 24 hrs, and tolerating food well.  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    Appreciated help by nephrology.  * hyponatremia   Resolved with IV fluids.  * Tachycardia  Likely dehydration  Give metoprolol, IV if needed  on IV fluids   Now stable, resume home meds.  * Hx of stroke  resume plavix and statin on d/c  * Hypertension   BP stable.  * Hypokalemia, hypomagnesemia   Replacing electrolytes.    All the records are reviewed and case discussed with Care Management/Social Workerr. Management plans discussed with the patient, family and they are in agreement.  CODE STATUS: Full  TOTAL TIME TAKING CARE OF THIS PATIENT: 35 minutes.     D/c today per primary team.   Vaughan Basta M.D on 07/27/2015   Between 7am to 6pm - Pager - [phone]  After 6pm go to [email or web address] - password [email or web address] [facility]  Office  [phone]  CC: Primary care physician; Golden Pop, MD  Note: This dictation was prepared with Dragon dictation along with smaller phrase technology. Any transcriptional errors that result from this process are unintentional.

## 2015-07-28 ENCOUNTER — Telehealth: Payer: Self-pay

## 2015-07-28 NOTE — Telephone Encounter (Signed)
Post discharge call to patient made at this time. Pain is controlled at this time. States that appetite has been "ok" since she came home from hospital. She is having very small bowel movements but is passing gas. Denies nausea and vomiting. I explained to the patient that if needed, she can begin taking Colace daily and if she is still having difficulty she may take Miralax 17g daily. But, it is extremely important to keep bowels moving well to avoid another bowel obstruction. Patient verbalizes understanding. No other questions or concerns.   Confirmed patient appointment scheduled. Encouraged patient to call with any questions that arise prior to appointment.

## 2015-08-03 ENCOUNTER — Encounter (INDEPENDENT_AMBULATORY_CARE_PROVIDER_SITE_OTHER): Payer: Self-pay

## 2015-08-03 ENCOUNTER — Encounter: Payer: Self-pay | Admitting: Surgery

## 2015-08-03 ENCOUNTER — Ambulatory Visit (INDEPENDENT_AMBULATORY_CARE_PROVIDER_SITE_OTHER): Payer: Commercial Managed Care - HMO | Admitting: Surgery

## 2015-08-03 VITALS — BP 144/81 | HR 80 | Temp 97.7°F | Ht 63.0 in | Wt 96.0 lb

## 2015-08-03 DIAGNOSIS — N178 Other acute kidney failure: Secondary | ICD-10-CM

## 2015-08-03 DIAGNOSIS — K5669 Other intestinal obstruction: Secondary | ICD-10-CM

## 2015-08-03 DIAGNOSIS — K56609 Unspecified intestinal obstruction, unspecified as to partial versus complete obstruction: Secondary | ICD-10-CM

## 2015-08-03 NOTE — Patient Instructions (Signed)
Please go and see your primary care doctor in order to resolve the swelling of your legs and feet.  Please start taking Miralax which could be bought over-the-counter to help move your bowels.  Try drinking Ensure or Boost with some ice cream as a shake to help you gain some weight and strength. Also, continue to drink Gatorade to help you with your Electrolytes.

## 2015-08-03 NOTE — Progress Notes (Signed)
Outpatient Surgical Follow Up  08/03/2015  TAVI GAUGHRAN is an 75 y.o. female.   CC: Bowel obstruction, resolved  HPI: This patient admitted the hospital with a diagnosis of bowel obstruction that was treated conservatively and spontaneously resolved. He is gaining weight at this point and eating better but still has a poor appetite requiring a special soft diet fixed by her family members. She is not using ensure at this point. She does have chronic renal failure and experienced some acute on chronic renal failure with her dehydration. We'll other complaints is that of swollen feet. She has no further nausea vomiting and no failure to pass flatus but is having constipation which has been a long-standing issue for her  Past Medical History  Diagnosis Date  . Depression   . DDD (degenerative disc disease), cervical   . Osteoporosis   . Hernia of abdominal cavity   . Vitamin B 12 deficiency   . Collagenous colitis   . Hyperlipidemia   . Chronic kidney disease   . Urine incontinence   . Tobacco abuse   . Stroke [facility])     Past Surgical History  Procedure Laterality Date  . Abdominal hysterectomy    . Abdominal aortic aneurysm repair    . Cholecystectomy    . Back surgery  [id number]  . Appendectomy      Family History  Problem Relation Age of Onset  . Diabetes Mother   . Diabetes Father     Social History:  reports that she has been smoking Cigarettes.  She has been smoking about 0.25 packs per day. She has never used smokeless tobacco. She reports that she does not drink alcohol or use illicit drugs.  Allergies:  Allergies  Allergen Reactions  . Codeine Nausea And Vomiting  . Morphine And Related Nausea And Vomiting  . Ultram [Tramadol] Itching    Medications reviewed.   Review of Systems:   Review of Systems  Constitutional: Positive for malaise/fatigue. Negative for fever, chills, weight loss and diaphoresis.  HENT: Negative.   Eyes: Negative.   Respiratory:  Negative.   Cardiovascular: Negative.   Gastrointestinal: Positive for abdominal pain and constipation. Negative for heartburn, nausea, vomiting, diarrhea, blood in stool and melena.       Minimal occasional abdominal pain  Genitourinary: Negative.   Musculoskeletal: Negative.   Skin: Negative.   Neurological: Positive for weakness.  Endo/Heme/Allergies: Negative.   Psychiatric/Behavioral: Negative.      Physical Exam:  BP 144/81 mmHg  Pulse 80  Temp(Src) 97.7 F (36.5 C) (Oral)  Ht '5\' 3"'$  (1.6 m)  Wt 96 lb (43.545 kg)  BMI 17.01 kg/m2  LMP  (Within Years)  Physical Exam  Constitutional: She is oriented to person, place, and time. No distress.  Very weak thin cachectic female patient  HENT:  Head: Normocephalic and atraumatic.  Eyes: Pupils are equal, round, and reactive to light. Right eye exhibits no discharge. Left eye exhibits no discharge. No scleral icterus.  Cardiovascular: Normal rate, regular rhythm and normal heart sounds.   Pulmonary/Chest: Effort normal and breath sounds normal. No respiratory distress. She has no wheezes. She has no rales.  Abdominal: Soft. She exhibits no distension. There is no tenderness. There is no rebound and no guarding.  Nontympanitic  Musculoskeletal: Normal range of motion. She exhibits edema. She exhibits no tenderness.  Neurological: She is alert and oriented to person, place, and time.  Skin: Skin is warm and dry. She is not diaphoretic. No erythema.  No results found for this or any previous visit (from the past 48 hour(s)). No results found.  Assessment/Plan:  Partial small bowel obstruction spontaneously resolved without the need for surgical intervention. She requires supplemental nutritional means and I discussed the use of an sure and boost with her family member well as continued soft diet. We also discussed the means of maintaining good bowel health including evacuation etc. follow-up with [place] on an as-needed basis but  encouraged to call Dr. Janae Bridgeman for follow-up for her feet swelling and for ongoing medical problems including weakness. No surgical issues at this point  Florene Glen, MD, FACS

## 2015-08-10 ENCOUNTER — Telehealth: Payer: Self-pay

## 2015-08-10 NOTE — Telephone Encounter (Signed)
Patient called and explained that her dermatologist needed a referral for to be seen again (Dr. Evorn Gong.)  I asked had she already been seen there before and she explained yes. After looking at her insurance ([place] [employer] HMO)-I explained to her that she didn't need another full referral, just an authorization for more visits from [employer]. Once I got the authorization number, I'd call her and then she could schedule her appointment. I called Dr. Roosvelt Harps office [facility]) and they confirmed that's what they needed. They tried to explain to her, but they weren't sure she quite understood. The authorization was approved, confirmation faxed to Dr. Evorn Gong and patient notified.

## 2015-08-16 ENCOUNTER — Ambulatory Visit (INDEPENDENT_AMBULATORY_CARE_PROVIDER_SITE_OTHER): Payer: Commercial Managed Care - HMO | Admitting: Family Medicine

## 2015-08-16 ENCOUNTER — Encounter: Payer: Self-pay | Admitting: Family Medicine

## 2015-08-16 VITALS — BP 146/82 | HR 87 | Temp 97.9°F | Ht 63.4 in | Wt 87.0 lb

## 2015-08-16 DIAGNOSIS — I129 Hypertensive chronic kidney disease with stage 1 through stage 4 chronic kidney disease, or unspecified chronic kidney disease: Secondary | ICD-10-CM

## 2015-08-16 DIAGNOSIS — E46 Unspecified protein-calorie malnutrition: Secondary | ICD-10-CM | POA: Diagnosis not present

## 2015-08-16 DIAGNOSIS — K5669 Other intestinal obstruction: Secondary | ICD-10-CM | POA: Diagnosis not present

## 2015-08-16 DIAGNOSIS — N184 Chronic kidney disease, stage 4 (severe): Secondary | ICD-10-CM | POA: Diagnosis not present

## 2015-08-16 DIAGNOSIS — K56609 Unspecified intestinal obstruction, unspecified as to partial versus complete obstruction: Secondary | ICD-10-CM

## 2015-08-16 MED ORDER — CITALOPRAM HYDROBROMIDE 10 MG PO TABS: 10.0000 mg | ORAL_TABLET | Freq: Every day | ORAL | Status: DC

## 2015-08-16 NOTE — Assessment & Plan Note (Signed)
Follow-up patient able to eat with no abdominal pain

## 2015-08-16 NOTE — Progress Notes (Signed)
BP 146/82 mmHg  Pulse 87  Temp(Src) 97.9 F (36.6 C)  Ht 5' 3.4" (1.61 m)  Wt 87 lb (39.463 kg)  BMI 15.22 kg/m2  SpO2 99%  LMP  (Within Years)   Subjective:    Patient ID: Melody Green, female    DOB: 1941-05-22, 75 y.o.   MRN: [id number]  HPI: Melody Green is a 75 y.o. female  Chief Complaint  Patient presents with  . Hospitalization Follow-up   She will follow-up small bowel obstructions able to eat the weight is fallen off after hospitalization Discuss malnutrition issues with patient I'll patient had for lunch was a piece of fruit cake Patient did eat breakfast with bacon, egg and toast  Relevant past medical, surgical, family and social history reviewed and updated as indicated. Interim medical history since our last visit reviewed. Allergies and medications reviewed and updated.  Review of Systems  Constitutional: Positive for fatigue. Negative for fever.  HENT: Negative.   Respiratory: Negative.   Musculoskeletal: Negative.     Per HPI unless specifically indicated above     Objective:    BP 146/82 mmHg  Pulse 87  Temp(Src) 97.9 F (36.6 C)  Ht 5' 3.4" (1.61 m)  Wt 87 lb (39.463 kg)  BMI 15.22 kg/m2  SpO2 99%  LMP  (Within Years)  Wt Readings from Last 3 Encounters:  08/16/15 87 lb (39.463 kg)  08/03/15 96 lb (43.545 kg)  07/27/15 94 lb 14.4 oz (43.046 kg)    Physical Exam  Constitutional: She is oriented to person, place, and time. She appears well-developed and well-nourished. No distress.  HENT:  Head: Normocephalic and atraumatic.  Right Ear: Hearing normal.  Left Ear: Hearing normal.  Nose: Nose normal.  Eyes: Conjunctivae and lids are normal. Right eye exhibits no discharge. Left eye exhibits no discharge. No scleral icterus.  Cardiovascular: Normal rate, regular rhythm and normal heart sounds.   Pulmonary/Chest: Effort normal and breath sounds normal. No respiratory distress.  Musculoskeletal: Normal range of motion.  Patient with no  further edema  Neurological: She is alert and oriented to person, place, and time.  Skin: Skin is intact. No rash noted.  Psychiatric: She has a normal mood and affect. Her speech is normal and behavior is normal. Judgment and thought content normal. Cognition and memory are normal.    Results for orders placed or performed during the hospital encounter of 07/23/15  C difficile quick scan w PCR reflex  Result Value Ref Range   C Diff antigen NEGATIVE NEGATIVE   C Diff toxin NEGATIVE NEGATIVE   C Diff interpretation Negative for C. difficile   Lipase, blood  Result Value Ref Range   Lipase 42 11 - 51 U/L  Comprehensive metabolic panel  Result Value Ref Range   Sodium 132 (L) 135 - 145 mmol/L   Potassium 5.4 (H) 3.5 - 5.1 mmol/L   Chloride 94 (L) 101 - 111 mmol/L   CO2 20 (L) 22 - 32 mmol/L   Glucose, Bld 86 65 - 99 mg/dL   BUN 64 (H) 6 - 20 mg/dL   Creatinine, Ser 5.66 (H) 0.44 - 1.00 mg/dL   Calcium 9.2 8.9 - 10.3 mg/dL   Total Protein 7.7 6.5 - 8.1 g/dL   Albumin 4.2 3.5 - 5.0 g/dL   AST 26 15 - 41 U/L   ALT 12 (L) 14 - 54 U/L   Alkaline Phosphatase 56 38 - 126 U/L   Total Bilirubin 1.0 0.3 -  1.2 mg/dL   GFR calc non Af Amer 7 (L) >60 mL/min   GFR calc Af Amer 8 (L) >60 mL/min   Anion gap 18 (H) 5 - 15  CBC  Result Value Ref Range   WBC 3.3 (L) 3.6 - 11.0 K/uL   RBC 5.20 3.80 - 5.20 MIL/uL   Hemoglobin 14.6 12.0 - 16.0 g/dL   HCT 45.1 35.0 - 47.0 %   MCV 86.7 80.0 - 100.0 fL   MCH 28.1 26.0 - 34.0 pg   MCHC 32.4 32.0 - 36.0 g/dL   RDW 15.1 (H) 11.5 - 14.5 %   Platelets 196 150 - 440 K/uL  Urinalysis complete, with microscopic ([facility] only)  Result Value Ref Range   Color, Urine YELLOW (A) YELLOW   APPearance HAZY (A) CLEAR   Glucose, UA 50 (A) NEGATIVE mg/dL   Bilirubin Urine NEGATIVE NEGATIVE   Ketones, ur TRACE (A) NEGATIVE mg/dL   Specific Gravity, Urine 1.014 1.005 - 1.030   Hgb urine dipstick 1+ (A) NEGATIVE   pH 5.0 5.0 - 8.0   Protein, ur 100 (A) NEGATIVE  mg/dL   Nitrite NEGATIVE NEGATIVE   Leukocytes, UA NEGATIVE NEGATIVE   RBC / HPF 0-5 0 - 5 RBC/hpf   WBC, UA 6-30 0 - 5 WBC/hpf   Bacteria, UA NONE SEEN NONE SEEN   Squamous Epithelial / LPF 6-30 (A) NONE SEEN  Troponin I  Result Value Ref Range   Troponin I 0.04 (H) <0.031 ng/mL  Basic metabolic panel  Result Value Ref Range   Sodium 129 (L) 135 - 145 mmol/L   Potassium 3.4 (L) 3.5 - 5.1 mmol/L   Chloride 101 101 - 111 mmol/L   CO2 20 (L) 22 - 32 mmol/L   Glucose, Bld 138 (H) 65 - 99 mg/dL   BUN 54 (H) 6 - 20 mg/dL   Creatinine, Ser 3.72 (H) 0.44 - 1.00 mg/dL   Calcium 7.5 (L) 8.9 - 10.3 mg/dL   GFR calc non Af Amer 11 (L) >60 mL/min   GFR calc Af Amer 13 (L) >60 mL/min   Anion gap 8 5 - 15  CBC  Result Value Ref Range   WBC 2.6 (L) 3.6 - 11.0 K/uL   RBC 4.63 3.80 - 5.20 MIL/uL   Hemoglobin 13.1 12.0 - 16.0 g/dL   HCT 40.5 35.0 - 47.0 %   MCV 87.5 80.0 - 100.0 fL   MCH 28.2 26.0 - 34.0 pg   MCHC 32.3 32.0 - 36.0 g/dL   RDW 14.9 (H) 11.5 - 14.5 %   Platelets 167 150 - 440 K/uL  CBC  Result Value Ref Range   WBC 9.1 3.6 - 11.0 K/uL   RBC 4.23 3.80 - 5.20 MIL/uL   Hemoglobin 12.2 12.0 - 16.0 g/dL   HCT 37.4 35.0 - 47.0 %   MCV 88.5 80.0 - 100.0 fL   MCH 28.8 26.0 - 34.0 pg   MCHC 32.6 32.0 - 36.0 g/dL   RDW 14.8 (H) 11.5 - 14.5 %   Platelets 147 (L) 150 - 440 K/uL  Basic metabolic panel  Result Value Ref Range   Sodium 133 (L) 135 - 145 mmol/L   Potassium 3.4 (L) 3.5 - 5.1 mmol/L   Chloride 107 101 - 111 mmol/L   CO2 20 (L) 22 - 32 mmol/L   Glucose, Bld 110 (H) 65 - 99 mg/dL   BUN 46 (H) 6 - 20 mg/dL   Creatinine, Ser 2.26 (H) 0.44 -  1.00 mg/dL   Calcium 7.6 (L) 8.9 - 10.3 mg/dL   GFR calc non Af Amer 20 (L) >60 mL/min   GFR calc Af Amer 23 (L) >60 mL/min   Anion gap 6 5 - 15  Magnesium  Result Value Ref Range   Magnesium 1.4 (L) 1.7 - 2.4 mg/dL  Phosphorus  Result Value Ref Range   Phosphorus 2.5 2.5 - 4.6 mg/dL  CBC  Result Value Ref Range   WBC 13.9  (H) 3.6 - 11.0 K/uL   RBC 4.58 3.80 - 5.20 MIL/uL   Hemoglobin 13.2 12.0 - 16.0 g/dL   HCT 41.0 35.0 - 47.0 %   MCV 89.5 80.0 - 100.0 fL   MCH 28.8 26.0 - 34.0 pg   MCHC 32.2 32.0 - 36.0 g/dL   RDW 15.1 (H) 11.5 - 14.5 %   Platelets 172 150 - 440 K/uL  Basic metabolic panel  Result Value Ref Range   Sodium 138 135 - 145 mmol/L   Potassium 3.3 (L) 3.5 - 5.1 mmol/L   Chloride 115 (H) 101 - 111 mmol/L   CO2 17 (L) 22 - 32 mmol/L   Glucose, Bld 99 65 - 99 mg/dL   BUN 29 (H) 6 - 20 mg/dL   Creatinine, Ser 1.34 (H) 0.44 - 1.00 mg/dL   Calcium 8.0 (L) 8.9 - 10.3 mg/dL   GFR calc non Af Amer 38 (L) >60 mL/min   GFR calc Af Amer 44 (L) >60 mL/min   Anion gap 6 5 - 15  Magnesium  Result Value Ref Range   Magnesium 1.6 (L) 1.7 - 2.4 mg/dL  Phosphorus  Result Value Ref Range   Phosphorus 1.9 (L) 2.5 - 4.6 mg/dL  Basic metabolic panel  Result Value Ref Range   Sodium 136 135 - 145 mmol/L   Potassium 3.5 3.5 - 5.1 mmol/L   Chloride 115 (H) 101 - 111 mmol/L   CO2 17 (L) 22 - 32 mmol/L   Glucose, Bld 100 (H) 65 - 99 mg/dL   BUN 15 6 - 20 mg/dL   Creatinine, Ser 0.88 0.44 - 1.00 mg/dL   Calcium 7.6 (L) 8.9 - 10.3 mg/dL   GFR calc non Af Amer >60 >60 mL/min   GFR calc Af Amer >60 >60 mL/min   Anion gap 4 (L) 5 - 15      Assessment & Plan:   Problem List Items Addressed This Visit      Cardiovascular and Mediastinum   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       Relevant Orders   Basic metabolic panel     Digestive   SBO (small bowel obstruction) ([place])    Follow-up patient able to eat with no abdominal pain      Relevant Orders   Basic metabolic panel     Other   Malnutrition ([place])    Discuss malnutrition eating and high-calorie foods Patient frankly didn't pay much attention      Relevant Orders   Basic metabolic panel       Follow up  plan: Return in about 3 months (around 11/13/2015) for wt check.

## 2015-08-16 NOTE — Assessment & Plan Note (Signed)
Discuss malnutrition eating and high-calorie foods Patient frankly didn't pay much attention

## 2015-08-16 NOTE — Assessment & Plan Note (Signed)
The current medical regimen is effective;  continue present plan and medications.  

## 2015-08-17 ENCOUNTER — Encounter: Payer: Self-pay | Admitting: Family Medicine

## 2015-08-17 LAB — BASIC METABOLIC PANEL
BUN/Creatinine Ratio: 9 — ABNORMAL LOW (ref 11–26)
BUN: 9 mg/dL (ref 8–27)
CHLORIDE: 99 mmol/L (ref 96–106)
CO2: 23 mmol/L (ref 18–29)
CREATININE: 0.98 mg/dL (ref 0.57–1.00)
Calcium: 8.5 mg/dL — ABNORMAL LOW (ref 8.7–10.3)
GFR calc Af Amer: 65 mL/min/{1.73_m2} (ref >59)
GFR calc non Af Amer: 57 mL/min/{1.73_m2} — ABNORMAL LOW (ref >59)
GLUCOSE: 103 mg/dL — AB (ref 65–99)
POTASSIUM: 3.5 mmol/L (ref 3.5–5.2)
SODIUM: 140 mmol/L (ref 134–144)

## 2015-08-20 DIAGNOSIS — M79609 Pain in unspecified limb: Secondary | ICD-10-CM | POA: Diagnosis not present

## 2015-08-20 DIAGNOSIS — I639 Cerebral infarction, unspecified: Secondary | ICD-10-CM | POA: Diagnosis not present

## 2015-08-20 DIAGNOSIS — I10 Essential (primary) hypertension: Secondary | ICD-10-CM | POA: Diagnosis not present

## 2015-08-20 DIAGNOSIS — I700 Atherosclerosis of aorta: Secondary | ICD-10-CM | POA: Diagnosis not present

## 2015-08-20 DIAGNOSIS — I714 Abdominal aortic aneurysm, without rupture: Secondary | ICD-10-CM | POA: Diagnosis not present

## 2015-08-20 DIAGNOSIS — I739 Peripheral vascular disease, unspecified: Secondary | ICD-10-CM | POA: Diagnosis not present

## 2015-08-20 DIAGNOSIS — I701 Atherosclerosis of renal artery: Secondary | ICD-10-CM | POA: Diagnosis not present

## 2015-08-20 DIAGNOSIS — F17200 Nicotine dependence, unspecified, uncomplicated: Secondary | ICD-10-CM | POA: Diagnosis not present

## 2015-09-14 ENCOUNTER — Ambulatory Visit: Payer: Commercial Managed Care - HMO | Admitting: Family Medicine

## 2015-09-14 DIAGNOSIS — I639 Cerebral infarction, unspecified: Secondary | ICD-10-CM | POA: Diagnosis not present

## 2015-09-14 DIAGNOSIS — M79609 Pain in unspecified limb: Secondary | ICD-10-CM | POA: Diagnosis not present

## 2015-09-14 DIAGNOSIS — F17200 Nicotine dependence, unspecified, uncomplicated: Secondary | ICD-10-CM | POA: Diagnosis not present

## 2015-09-14 DIAGNOSIS — I700 Atherosclerosis of aorta: Secondary | ICD-10-CM | POA: Diagnosis not present

## 2015-09-14 DIAGNOSIS — I739 Peripheral vascular disease, unspecified: Secondary | ICD-10-CM | POA: Diagnosis not present

## 2015-09-14 DIAGNOSIS — I701 Atherosclerosis of renal artery: Secondary | ICD-10-CM | POA: Diagnosis not present

## 2015-09-14 DIAGNOSIS — I714 Abdominal aortic aneurysm, without rupture: Secondary | ICD-10-CM | POA: Diagnosis not present

## 2015-09-14 DIAGNOSIS — I10 Essential (primary) hypertension: Secondary | ICD-10-CM | POA: Diagnosis not present

## 2015-10-11 ENCOUNTER — Telehealth: Payer: Self-pay | Admitting: Family Medicine

## 2015-10-11 DIAGNOSIS — L989 Disorder of the skin and subcutaneous tissue, unspecified: Secondary | ICD-10-CM

## 2015-10-11 NOTE — Telephone Encounter (Signed)
Pt called and stated that she needed a referral to go to Dr Evorn Gong in dermatology(appt tomorrow).

## 2015-10-12 DIAGNOSIS — Z08 Encounter for follow-up examination after completed treatment for malignant neoplasm: Secondary | ICD-10-CM | POA: Diagnosis not present

## 2015-10-12 DIAGNOSIS — Z85828 Personal history of other malignant neoplasm of skin: Secondary | ICD-10-CM | POA: Diagnosis not present

## 2015-10-12 DIAGNOSIS — X32XXXA Exposure to sunlight, initial encounter: Secondary | ICD-10-CM | POA: Diagnosis not present

## 2015-10-12 DIAGNOSIS — L570 Actinic keratosis: Secondary | ICD-10-CM | POA: Diagnosis not present

## 2015-10-13 ENCOUNTER — Other Ambulatory Visit: Payer: Self-pay | Admitting: Family Medicine

## 2015-11-10 ENCOUNTER — Other Ambulatory Visit: Payer: Self-pay | Admitting: Family Medicine

## 2015-11-17 ENCOUNTER — Ambulatory Visit (INDEPENDENT_AMBULATORY_CARE_PROVIDER_SITE_OTHER): Payer: Commercial Managed Care - HMO | Admitting: Family Medicine

## 2015-11-17 ENCOUNTER — Encounter: Payer: Self-pay | Admitting: Family Medicine

## 2015-11-17 VITALS — BP 146/86 | HR 77 | Temp 97.7°F | Ht 63.7 in | Wt 85.3 lb

## 2015-11-17 DIAGNOSIS — I129 Hypertensive chronic kidney disease with stage 1 through stage 4 chronic kidney disease, or unspecified chronic kidney disease: Secondary | ICD-10-CM

## 2015-11-17 DIAGNOSIS — E871 Hypo-osmolality and hyponatremia: Secondary | ICD-10-CM | POA: Diagnosis not present

## 2015-11-17 DIAGNOSIS — K56609 Unspecified intestinal obstruction, unspecified as to partial versus complete obstruction: Secondary | ICD-10-CM

## 2015-11-17 DIAGNOSIS — E46 Unspecified protein-calorie malnutrition: Secondary | ICD-10-CM

## 2015-11-17 DIAGNOSIS — K5669 Other intestinal obstruction: Secondary | ICD-10-CM

## 2015-11-17 DIAGNOSIS — N184 Chronic kidney disease, stage 4 (severe): Secondary | ICD-10-CM | POA: Diagnosis not present

## 2015-11-17 LAB — CBC WITH DIFFERENTIAL/PLATELET
HEMATOCRIT: 41.7 % (ref 34.0–46.6)
Hemoglobin: 14.2 g/dL (ref 11.1–15.9)
LYMPHS ABS: 1.7 10*3/uL (ref 0.7–3.1)
LYMPHS: 22 %
MCH: 31.1 pg (ref 26.6–33.0)
MCHC: 34.1 g/dL (ref 31.5–35.7)
MCV: 91 fL (ref 79–97)
MID (Absolute): 0.9 10*3/uL (ref 0.1–1.6)
MID: 12 %
NEUTROS PCT: 66 %
Neutrophils Absolute: 4.9 10*3/uL (ref 1.4–7.0)
Platelets: 217 10*3/uL (ref 150–379)
RBC: 4.56 x10E6/uL (ref 3.77–5.28)
RDW: 13.4 % (ref 12.3–15.4)
WBC: 7.5 10*3/uL (ref 3.4–10.8)

## 2015-11-17 NOTE — Assessment & Plan Note (Signed)
Patient avoiding nephrotoxic agents checking BMP today to see [place] returning to normal

## 2015-11-17 NOTE — Assessment & Plan Note (Addendum)
Resolved but still weight loss Patient was so sick CBC white count was both elevated and low repeated today is normal

## 2015-11-17 NOTE — Assessment & Plan Note (Signed)
Discuss weight eating nutrition maybe use dietary supplements such as boost or other protein drinks

## 2015-11-17 NOTE — Progress Notes (Signed)
BP 146/86 mmHg  Pulse 77  Temp(Src) 97.7 F (36.5 C)  Ht 5' 3.7" (1.618 m)  Wt 85 lb 4.8 oz (38.692 kg)  BMI 14.78 kg/m2  SpO2 99%  LMP  (Within Years)   Subjective:    Patient ID: Melody Green, female    DOB: Jul 10, 1940, 75 y.o.   MRN: [id number]  HPI: Melody Green is a 75 y.o. female  Chief Complaint  Patient presents with  . Weight Check    with bloodwork  Patient's eating okay feeling okay and not having any nausea abdominal complaints chest complaints trouble swallowing. Patient states she's snacking a lot weight loss has stabilized from February but hasn't gained weight and is lost 2 pounds. Patient though with no bowel complaints no blood in stool or urine And no diarrhea. Patient's son is a part-time caregiver for patient especially helps with food and eating. Discussed extensively with patient about bleeding patient refuses to eat unless she is hungry and then will often just have a little water some Gatorade doesn't do protein drinks. Relevant past medical, surgical, family and social history reviewed and updated as indicated. Interim medical history since our last visit reviewed. Allergies and medications reviewed and updated.  Review of Systems  Constitutional: Negative for fever, chills, diaphoresis and fatigue.  Respiratory: Negative.   Cardiovascular: Negative.     Per HPI unless specifically indicated above     Objective:    BP 146/86 mmHg  Pulse 77  Temp(Src) 97.7 F (36.5 C)  Ht 5' 3.7" (1.618 m)  Wt 85 lb 4.8 oz (38.692 kg)  BMI 14.78 kg/m2  SpO2 99%  LMP  (Within Years)  Wt Readings from Last 3 Encounters:  11/17/15 85 lb 4.8 oz (38.692 kg)  08/16/15 87 lb (39.463 kg)  08/03/15 96 lb (43.545 kg)    Physical Exam  Constitutional: She is oriented to person, place, and time. She appears well-developed and well-nourished. No distress.  HENT:  Head: Normocephalic and atraumatic.  Right Ear: Hearing normal.  Left Ear: Hearing normal.  Nose:  Nose normal.  Eyes: Conjunctivae and lids are normal. Right eye exhibits no discharge. Left eye exhibits no discharge. No scleral icterus.  Cardiovascular: Normal rate, regular rhythm and normal heart sounds.   Pulmonary/Chest: Effort normal and breath sounds normal. No respiratory distress.  Musculoskeletal: Normal range of motion.  Neurological: She is alert and oriented to person, place, and time.  Skin: Skin is intact. No rash noted.  Psychiatric: She has a normal mood and affect. Her speech is normal and behavior is normal. Judgment and thought content normal. Cognition and memory are normal.    Results for orders placed or performed in visit on 11/17/15  CBC With Differential/Platelet  Result Value Ref Range   WBC 7.5 3.4 - 10.8 x10E3/uL   RBC 4.56 3.77 - 5.28 x10E6/uL   Hemoglobin 14.2 11.1 - 15.9 g/dL   Hematocrit 41.7 34.0 - 46.6 %   MCV 91 79 - 97 fL   MCH 31.1 26.6 - 33.0 pg   MCHC 34.1 31.5 - 35.7 g/dL   RDW 13.4 12.3 - 15.4 %   Platelets 217 150 - 379 x10E3/uL   Neutrophils 66 %   Lymphs 22 %   MID 12 %   Neutrophils Absolute 4.9 1.4 - 7.0 x10E3/uL   Lymphocytes Absolute 1.7 0.7 - 3.1 x10E3/uL   MID (Absolute) 0.9 0.1 - 1.6 X10E3/uL      Assessment & Plan:   Problem  List Items Addressed This Visit      Cardiovascular and Mediastinum   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)    Patient avoiding nephrotoxic agents checking BMP today to see [place] returning to normal        Digestive   SBO (small bowel obstruction) ([place])    Resolved but still weight loss Patient was so sick CBC white count was both elevated and low repeated today is normal        Other   Hyponatremia - Primary   Relevant Orders   Basic metabolic panel   Malnutrition ([place])    Discuss weight eating nutrition maybe use dietary supplements such as boost or other protein drinks      Relevant Orders   CBC With Differential/Platelet (Completed)       Follow up  plan: Return in about 3 months (around 02/17/2016) for Physical Exam.

## 2015-11-18 ENCOUNTER — Encounter: Payer: Self-pay | Admitting: Family Medicine

## 2015-11-18 LAB — BASIC METABOLIC PANEL
BUN/Creatinine Ratio: 9 — ABNORMAL LOW (ref 12–28)
BUN: 8 mg/dL (ref 8–27)
CALCIUM: 9 mg/dL (ref 8.7–10.3)
CHLORIDE: 97 mmol/L (ref 96–106)
CO2: 22 mmol/L (ref 18–29)
Creatinine, Ser: 0.94 mg/dL (ref 0.57–1.00)
GFR calc Af Amer: 69 mL/min/{1.73_m2} (ref >59)
GFR calc non Af Amer: 60 mL/min/{1.73_m2} (ref >59)
GLUCOSE: 83 mg/dL (ref 65–99)
POTASSIUM: 4.8 mmol/L (ref 3.5–5.2)
Sodium: 136 mmol/L (ref 134–144)

## 2015-12-27 ENCOUNTER — Other Ambulatory Visit: Payer: Self-pay | Admitting: Family Medicine

## 2016-01-06 ENCOUNTER — Encounter: Payer: Self-pay | Admitting: Family Medicine

## 2016-01-06 ENCOUNTER — Ambulatory Visit (INDEPENDENT_AMBULATORY_CARE_PROVIDER_SITE_OTHER): Payer: Commercial Managed Care - HMO | Admitting: Family Medicine

## 2016-01-06 VITALS — BP 157/98 | HR 89 | Temp 97.9°F | Wt 82.0 lb

## 2016-01-06 DIAGNOSIS — R079 Chest pain, unspecified: Secondary | ICD-10-CM

## 2016-01-06 DIAGNOSIS — R1013 Epigastric pain: Secondary | ICD-10-CM

## 2016-01-06 MED ORDER — BENZONATATE 100 MG PO CAPS: 100.0000 mg | ORAL_CAPSULE | Freq: Two times a day (BID) | ORAL | Status: DC | PRN

## 2016-01-06 NOTE — Patient Instructions (Signed)
Follow up if symptoms worsen or fail to improve.

## 2016-01-06 NOTE — Progress Notes (Signed)
BP 157/98 mmHg  Pulse 89  Temp(Src) 97.9 F (36.6 C)  Wt 82 lb (37.195 kg)  SpO2 96%  LMP  (Within Years)   Subjective:    Patient ID: Melody Green, female    DOB: 11-06-1940, 75 y.o.   MRN: [id number]  HPI: Melody Green is a 75 y.o. female  Chief Complaint  Patient presents with  . URI    some productive cough, not much congestion, no fever but had chills this am, SOB with activity, bad pain around her left breast that comes and goes for several months will only last a short while. No jaw pain.    Chest, stomach, and back pain since being in the hospital in March for renal failure and small bowel obstruction. States last night she felt a lump in her mid epigastric area after eating and she is very afraid of another SBO. Tight clothes and bras hurt around her abdomen. Denies vomiting, but is constipated which is normal for her.  Hx of aneurysms.   Also c/o a cough that has been worsening the past few days, worst at night when laying down. Sometimes productive of clear phlegm. Some intermittent DOE.   Relevant past medical, surgical, family and social history reviewed and updated as indicated. Interim medical history since our last visit reviewed. Allergies and medications reviewed and updated.  Review of Systems  Constitutional: Negative.   HENT: Negative for congestion, postnasal drip, rhinorrhea, sinus pressure and sore throat.   Eyes: Negative.   Respiratory: Positive for cough and shortness of breath (exertional). Negative for wheezing.   Cardiovascular: Positive for chest pain.  Gastrointestinal: Positive for abdominal pain and constipation. Negative for nausea, vomiting and diarrhea.  Genitourinary: Negative.   Musculoskeletal: Positive for back pain.  Skin: Negative.   Neurological: Negative.   Psychiatric/Behavioral: Negative.     Per HPI unless specifically indicated above     Objective:    BP 157/98 mmHg  Pulse 89  Temp(Src) 97.9 F (36.6 C)  Wt 82 lb  (37.195 kg)  SpO2 96%  LMP  (Within Years)  Wt Readings from Last 3 Encounters:  01/06/16 82 lb (37.195 kg)  11/17/15 85 lb 4.8 oz (38.692 kg)  08/16/15 87 lb (39.463 kg)    Physical Exam  Constitutional: No distress.  Appears stated age, underweight  HENT:  Head: Atraumatic.  Mouth/Throat: Oropharynx is clear and moist.  Eyes: Conjunctivae are normal. Pupils are equal, round, and reactive to light. No scleral icterus.  Neck: Normal range of motion. Neck supple.  Cardiovascular: Normal rate, regular rhythm, normal heart sounds and intact distal pulses.   Pulmonary/Chest: Effort normal. No respiratory distress. She has no wheezes. She has no rales.  Abdominal: Soft. Bowel sounds are normal. She exhibits no distension and no mass. There is tenderness (epigastric TTP). There is no rebound and no guarding.  No abdominal bruits noted  Musculoskeletal: Normal range of motion.  Neurological: She is alert.  Skin: Skin is warm and dry.  Psychiatric: She has a normal mood and affect. Her behavior is normal.  Nursing note and vitals reviewed.   ECG reviewed, stable from previous.     Assessment & Plan:   Problem List Items Addressed This Visit      Other   Abdominal pain   Relevant Orders   CT Abdomen Pelvis Wo Contrast    Other Visit Diagnoses    Chest pain, unspecified chest pain type    -  Primary  ECG reassuring. Likely multifactorial due to patient's anxiety and GERD issues. Patient to continue prilosec daily and eat small, frequent meals throughout day.    Relevant Orders    EKG 12-Lead (Completed)       Exam reassuring, but will get CT abd pelvis to r/o any bowel or vascular issues given her history. BP elevated some today as she is off her medications due to renal function - will address this at her follow up next month   Follow up plan: Return if symptoms worsen or fail to improve.

## 2016-01-07 ENCOUNTER — Telehealth: Payer: Self-pay | Admitting: Family Medicine

## 2016-01-07 ENCOUNTER — Ambulatory Visit
Admission: RE | Admit: 2016-01-07 | Discharge: 2016-01-07 | Disposition: A | Discharge status: Home / Self Care | Payer: Commercial Managed Care - HMO | Source: Ambulatory Visit | Attending: Family Medicine | Admitting: Family Medicine

## 2016-01-07 DIAGNOSIS — K4090 Unilateral inguinal hernia, without obstruction or gangrene, not specified as recurrent: Secondary | ICD-10-CM | POA: Diagnosis not present

## 2016-01-07 DIAGNOSIS — R1013 Epigastric pain: Secondary | ICD-10-CM

## 2016-01-07 MED ORDER — OMEPRAZOLE 20 MG PO CPDR: 20.0000 mg | DELAYED_RELEASE_CAPSULE | Freq: Every day | ORAL | Status: DC

## 2016-01-07 NOTE — Telephone Encounter (Signed)
Patient notified

## 2016-01-07 NOTE — Telephone Encounter (Signed)
Please call pt and let her know her CT scan results came back normal, reassure her that there is no bowel obstruction or other serious issues going on. The scan did indicate some stomach lining irritation so recommend doubling her prilosec for a few weeks to see if that helps her feel better. I will send some extra to her pharmacy in case she wants them.

## 2016-01-28 ENCOUNTER — Encounter (INDEPENDENT_AMBULATORY_CARE_PROVIDER_SITE_OTHER): Payer: Self-pay

## 2016-02-03 ENCOUNTER — Other Ambulatory Visit: Payer: Self-pay | Admitting: Family Medicine

## 2016-02-08 ENCOUNTER — Encounter: Payer: Self-pay | Admitting: Family Medicine

## 2016-02-08 ENCOUNTER — Ambulatory Visit (INDEPENDENT_AMBULATORY_CARE_PROVIDER_SITE_OTHER): Payer: Commercial Managed Care - HMO | Admitting: Family Medicine

## 2016-02-08 VITALS — BP 142/89 | HR 97 | Temp 97.6°F | Wt 82.0 lb

## 2016-02-08 DIAGNOSIS — R062 Wheezing: Secondary | ICD-10-CM | POA: Diagnosis not present

## 2016-02-08 DIAGNOSIS — J441 Chronic obstructive pulmonary disease with (acute) exacerbation: Secondary | ICD-10-CM

## 2016-02-08 DIAGNOSIS — I129 Hypertensive chronic kidney disease with stage 1 through stage 4 chronic kidney disease, or unspecified chronic kidney disease: Secondary | ICD-10-CM

## 2016-02-08 DIAGNOSIS — Z720 Tobacco use: Secondary | ICD-10-CM | POA: Diagnosis not present

## 2016-02-08 DIAGNOSIS — M5432 Sciatica, left side: Secondary | ICD-10-CM | POA: Diagnosis not present

## 2016-02-08 DIAGNOSIS — E871 Hypo-osmolality and hyponatremia: Secondary | ICD-10-CM

## 2016-02-08 DIAGNOSIS — N184 Chronic kidney disease, stage 4 (severe): Secondary | ICD-10-CM | POA: Diagnosis not present

## 2016-02-08 MED ORDER — AZITHROMYCIN 250 MG PO TABS: ORAL_TABLET | ORAL | 0 refills | Status: DC

## 2016-02-08 MED ORDER — PREDNISONE 10 MG PO TABS: ORAL_TABLET | ORAL | 0 refills | Status: DC

## 2016-02-08 MED ORDER — ALBUTEROL SULFATE (2.5 MG/3ML) 0.083% IN NEBU: 2.5000 mg | INHALATION_SOLUTION | Freq: Once | RESPIRATORY_TRACT | Status: DC

## 2016-02-08 MED ORDER — LIDOCAINE 5 % EX PTCH: 1.0000 | MEDICATED_PATCH | CUTANEOUS | 0 refills | Status: DC

## 2016-02-08 MED ORDER — ALBUTEROL SULFATE HFA 108 (90 BASE) MCG/ACT IN AERS: 2.0000 | INHALATION_SPRAY | Freq: Four times a day (QID) | RESPIRATORY_TRACT | 0 refills | Status: DC | PRN

## 2016-02-08 NOTE — Assessment & Plan Note (Addendum)
Did well with wellbutrin in the past to quit. Will restart. Recheck 1 month to see how she's doing. She is concerned about her renal function on this medicine, so we will check it monthly while on it. Has coughed up blood, likely bronchitis. Needs screening for lung cancer- will get that ordered today.

## 2016-02-08 NOTE — Assessment & Plan Note (Signed)
Checking kidney function. Will continue to check it while on wellbutrin.

## 2016-02-08 NOTE — Progress Notes (Signed)
BP (!) 142/89 (BP Location: Left Arm, Patient Position: Sitting, Cuff Size: Small)   Pulse 97   Temp 97.6 F (36.4 C)   Wt 82 lb (37.2 kg)   LMP  (Within Years)   SpO2 96%   BMI 14.21 kg/m    Subjective:    Patient ID: Melody Green, female    DOB: 12-Oct-1940, 75 y.o.   MRN: [id number]  HPI: Melody Green is a 75 y.o. female  Chief Complaint  Patient presents with  . Back Pain  . Hip Pain  . Nicotine Dependence    Patient states that she needs something to help her with quiting   HIP/BACK PAIN Duration: started yesterday- better today Location: low back on the L Involved hip: left  Mechanism of injury: unknown Location: posterior Onset: sudden  Severity: severe  Quality: shooting pain Frequency: intermittent Radiation: yes down her L leg Aggravating factors: moving her L leg    Alleviating factors: icy hot and heating pad   Status: better Treatments attempted: ice and heat   Relief with NSAIDs?: No NSAIDs Taken Weakness with weight bearing: no Weakness with walking: no Paresthesias / decreased sensation: no Swelling: no Redness:no Fevers: no  SMOKING CESSATION Smoking Status: current every day smoker Smoking Amount: 1ppd Smoking Onset: 50 years Smoking Quit Date: not set Smoking triggers: boredom Type of tobacco use: cigarette, coffee, someone else smoking Children in the house: no Other household members who smoke: yes Treatments attempted: wellbutrin- does well Pneumovax: up to date  Relevant past medical, surgical, family and social history reviewed and updated as indicated. Interim medical history since our last visit reviewed. Allergies and medications reviewed and updated.  Review of Systems  Constitutional: Negative.   Respiratory: Positive for cough, chest tightness and shortness of breath. Negative for apnea, choking, wheezing and stridor.   Cardiovascular: Negative.   Psychiatric/Behavioral: Negative.     Per HPI unless specifically  indicated above     Objective:    BP (!) 142/89 (BP Location: Left Arm, Patient Position: Sitting, Cuff Size: Small)   Pulse 97   Temp 97.6 F (36.4 C)   Wt 82 lb (37.2 kg)   LMP  (Within Years)   SpO2 96%   BMI 14.21 kg/m   Wt Readings from Last 3 Encounters:  02/08/16 82 lb (37.2 kg)  01/06/16 82 lb (37.2 kg)  11/17/15 85 lb 4.8 oz (38.7 kg)    Physical Exam  Constitutional: She is oriented to person, place, and time. She appears well-developed and well-nourished. No distress.  HENT:  Head: Normocephalic and atraumatic.  Right Ear: Hearing normal.  Left Ear: Hearing normal.  Nose: Nose normal.  Eyes: Conjunctivae and lids are normal. Right eye exhibits no discharge. Left eye exhibits no discharge. No scleral icterus.  Cardiovascular: Normal rate, regular rhythm, normal heart sounds and intact distal pulses.  Exam reveals no gallop and no friction rub.   No murmur heard. Pulmonary/Chest: Effort normal. No respiratory distress. She has wheezes in the right upper field, the right middle field, the right lower field, the left upper field and the left lower field. She has rhonchi in the right upper field, the right middle field, the right lower field, the left upper field, the left middle field and the left lower field. She has no rales. She exhibits no tenderness.  Musculoskeletal: Normal range of motion.  Neurological: She is alert and oriented to person, place, and time.  Skin: Skin is warm, dry and intact.  No rash noted. No erythema. No pallor.  Psychiatric: She has a normal mood and affect. Her speech is normal and behavior is normal. Judgment and thought content normal. Cognition and memory are normal.  Nursing note and vitals reviewed. Back Exam:    Inspection:  Normal spinal curvature.  No deformity, ecchymosis, erythema, or lesions     Palpation:     Midline spinal tenderness: no      Paralumbar tenderness: yes Left     Parathoracic tenderness: no      Buttocks  tenderness: yesLeft     Range of Motion:      Flexion: Fingers to Knees     Extension:Decreased     Lateral bending:Decreased    Rotation:Decreased    Neuro Exam:Lower extremity DTRs normal & symmetric.  Strength and sensation intact.    Special Tests:      Straight leg raise:negative  Results for orders placed or performed in visit on 11/17/15  CBC With Differential/Platelet  Result Value Ref Range   WBC 7.5 3.4 - 10.8 x10E3/uL   RBC 4.56 3.77 - 5.28 x10E6/uL   Hemoglobin 14.2 11.1 - 15.9 g/dL   Hematocrit 41.7 34.0 - 46.6 %   MCV 91 79 - 97 fL   MCH 31.1 26.6 - 33.0 pg   MCHC 34.1 31.5 - 35.7 g/dL   RDW 13.4 12.3 - 15.4 %   Platelets 217 150 - 379 x10E3/uL   Neutrophils 66 %   Lymphs 22 %   MID 12 %   Neutrophils Absolute 4.9 1.4 - 7.0 x10E3/uL   Lymphocytes Absolute 1.7 0.7 - 3.1 x10E3/uL   MID (Absolute) 0.9 0.1 - 1.6 [id number]  Basic metabolic panel  Result Value Ref Range   Glucose 83 65 - 99 mg/dL   BUN 8 8 - 27 mg/dL   Creatinine, Ser 0.94 0.57 - 1.00 mg/dL   GFR calc non Af Amer 60 >59 mL/min/1.73   GFR calc Af Amer 69 >59 mL/min/1.73   BUN/Creatinine Ratio 9 (L) 12 - 28   Sodium 136 134 - 144 mmol/L   Potassium 4.8 3.5 - 5.2 mmol/L   Chloride 97 96 - 106 mmol/L   CO2 22 18 - 29 mmol/L   Calcium 9.0 8.7 - 10.3 mg/dL      Assessment & Plan:   Problem List Items Addressed This Visit      Cardiovascular and Mediastinum   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 Will continue to check it while on wellbutrin.       Relevant Orders   Basic metabolic panel     Other   Hyponatremia    Checking kidney function. Will continue to check it while on wellbutrin.       Relevant Orders   Basic metabolic panel   Tobacco abuse    Did well with wellbutrin in the past to quit. Will restart. Recheck 1 month to see how she's doing. She is concerned about her renal function on this medicine, so we will check it  monthly while on it. Has coughed up blood, likely bronchitis. Needs screening for lung cancer- will get that ordered today.      Relevant Orders   CT CHEST LUNG CA SCREEN LOW DOSE W/O CM    Other Visit Diagnoses    Sciatica of left side    -  Primary   Lidocaine patches as she gets constipated on NSAIDs. Stretches, if not  better in 2 weeks, she will let us know.    Wheezing       Better following neb. Likely COPD exacerbation. Will check spiro next visit.    Relevant Medications   albuterol (PROVENTIL) (2.5 MG/3ML) 0.083% nebulizer solution 2.5 mg   COPD exacerbation (HCC)       Relevant Medications   albuterol (PROVENTIL) (2.5 MG/3ML) 0.083% nebulizer solution 2.5 mg   predniSONE (DELTASONE) 10 MG tablet   azithromycin (ZITHROMAX) 250 MG tablet   albuterol (PROVENTIL HFA;VENTOLIN HFA) 108 (90 Base) MCG/ACT inhaler       Follow up plan: Return for 2 weeks lung recheck and back pain with spiro, 4 weeks smoking follow up.

## 2016-02-08 NOTE — Patient Instructions (Addendum)
Sciatica With Rehab The sciatic nerve runs from the back down the leg and is responsible for sensation and control of the muscles in the back (posterior) side of the thigh, lower leg, and foot. Sciatica is a condition that is characterized by inflammation of this nerve.  SYMPTOMS   Signs of nerve damage, including numbness and/or weakness along the posterior side of the lower extremity.  Pain in the back of the thigh that may also travel down the leg.  Pain that worsens when sitting for long periods of time.  Occasionally, pain in the back or buttock. CAUSES  Inflammation of the sciatic nerve is the cause of sciatica. The inflammation is due to something irritating the nerve. Common sources of irritation include:  Sitting for long periods of time.  Direct trauma to the nerve.  Arthritis of the spine.  Herniated or ruptured disk.  Slipping of the vertebrae (spondylolisthesis).  Pressure from soft tissues, such as muscles or ligament-like tissue (fascia). RISK INCREASES WITH:  Sports that place pressure or stress on the spine (football or weightlifting).  Poor strength and flexibility.  Failure to warm up properly before activity.  Family history of low back pain or disk disorders.  Previous back injury or surgery.  Poor body mechanics, especially when lifting, or poor posture. PREVENTION   Warm up and stretch properly before activity.  Maintain physical fitness:  Strength, flexibility, and endurance.  Cardiovascular fitness.  Learn and use proper technique, especially with posture and lifting. When possible, have coach correct improper technique.  Avoid activities that place stress on the spine. PROGNOSIS If treated properly, then sciatica usually resolves within 6 weeks. However, occasionally surgery is necessary.  RELATED COMPLICATIONS   Permanent nerve damage, including pain, numbness, tingle, or weakness.  Chronic back pain.  Risks of surgery: infection,  bleeding, nerve damage, or damage to surrounding tissues. TREATMENT Treatment initially involves resting from any activities that aggravate your symptoms. The use of ice and medication may help reduce pain and inflammation. The use of strengthening and stretching exercises may help reduce pain with activity. These exercises may be performed at home or with referral to a therapist. A therapist may recommend further treatments, such as transcutaneous electronic nerve stimulation (TENS) or ultrasound. Your caregiver may recommend corticosteroid injections to help reduce inflammation of the sciatic nerve. If symptoms persist despite non-surgical (conservative) treatment, then surgery may be recommended. MEDICATION  If pain medication is necessary, then nonsteroidal anti-inflammatory medications, such as aspirin and ibuprofen, or other minor pain relievers, such as acetaminophen, are often recommended.  Do not take pain medication for 7 days before surgery.  Prescription pain relievers may be given if deemed necessary by your caregiver. Use only as directed and only as much as you need.  Ointments applied to the skin may be helpful.  Corticosteroid injections may be given by your caregiver. These injections should be reserved for the most serious cases, because they may only be given a certain number of times. HEAT AND COLD  Cold treatment (icing) relieves pain and reduces inflammation. Cold treatment should be applied for 10 to 15 minutes every 2 to 3 hours for inflammation and pain and immediately after any activity that aggravates your symptoms. Use ice packs or massage the area with a piece of ice (ice massage).  Heat treatment may be used prior to performing the stretching and strengthening activities prescribed by your caregiver, physical therapist, or athletic trainer. Use a heat pack or soak the injury in warm water.   SEEK MEDICAL CARE IF:  Treatment seems to offer no benefit, or the condition  worsens.  Any medications produce adverse side effects. EXERCISES  RANGE OF MOTION (ROM) AND STRETCHING EXERCISES - Sciatica Most people with sciatic will find that their symptoms worsen with either excessive bending forward (flexion) or arching at the low back (extension). The exercises which will help resolve your symptoms will focus on the opposite motion. Your physician, physical therapist or athletic trainer will help you determine which exercises will be most helpful to resolve your low back pain. Do not complete any exercises without first consulting with your clinician. Discontinue any exercises which worsen your symptoms until you speak to your clinician. If you have pain, numbness or tingling which travels down into your buttocks, leg or foot, the goal of the therapy is for these symptoms to move closer to your back and eventually resolve. Occasionally, these leg symptoms will get better, but your low back pain may worsen; this is typically an indication of progress in your rehabilitation. Be certain to be very alert to any changes in your symptoms and the activities in which you participated in the 24 hours prior to the change. Sharing this information with your clinician will allow him/her to most efficiently treat your condition. These exercises may help you when beginning to rehabilitate your injury. Your symptoms may resolve with or without further involvement from your physician, physical therapist or athletic trainer. While completing these exercises, remember:   Restoring tissue flexibility helps normal motion to return to the joints. This allows healthier, less painful movement and activity.  An effective stretch should be held for at least 30 seconds.  A stretch should never be painful. You should only feel a gentle lengthening or release in the stretched tissue. FLEXION RANGE OF MOTION AND STRETCHING EXERCISES: STRETCH - Flexion, Single Knee to Chest   Lie on a firm bed or floor  with both legs extended in front of you.  Keeping one leg in contact with the floor, bring your opposite knee to your chest. Hold your leg in place by either grabbing behind your thigh or at your knee.  Pull until you feel a gentle stretch in your low back. Hold __________ seconds.  Slowly release your grasp and repeat the exercise with the opposite side. Repeat __________ times. Complete this exercise __________ times per day.  STRETCH - Flexion, Double Knee to Chest  Lie on a firm bed or floor with both legs extended in front of you.  Keeping one leg in contact with the floor, bring your opposite knee to your chest.  Tense your stomach muscles to support your back and then lift your other knee to your chest. Hold your legs in place by either grabbing behind your thighs or at your knees.  Pull both knees toward your chest until you feel a gentle stretch in your low back. Hold __________ seconds.  Tense your stomach muscles and slowly return one leg at a time to the floor. Repeat __________ times. Complete this exercise __________ times per day.  STRETCH - Low Trunk Rotation   Lie on a firm bed or floor. Keeping your legs in front of you, bend your knees so they are both pointed toward the ceiling and your feet are flat on the floor.  Extend your arms out to the side. This will stabilize your upper body by keeping your shoulders in contact with the floor.  Gently and slowly drop both knees together to one side until   you feel a gentle stretch in your low back. Hold for __________ seconds.  Tense your stomach muscles to support your low back as you bring your knees back to the starting position. Repeat the exercise to the other side. Repeat __________ times. Complete this exercise __________ times per day  EXTENSION RANGE OF MOTION AND FLEXIBILITY EXERCISES: STRETCH - Extension, Prone on Elbows  Lie on your stomach on the floor, a bed will be too soft. Place your palms about shoulder  width apart and at the height of your head.  Place your elbows under your shoulders. If this is too painful, stack pillows under your chest.  Allow your body to relax so that your hips drop lower and make contact more completely with the floor.  Hold this position for __________ seconds.  Slowly return to lying flat on the floor. Repeat __________ times. Complete this exercise __________ times per day.  RANGE OF MOTION - Extension, Prone Press Ups  Lie on your stomach on the floor, a bed will be too soft. Place your palms about shoulder width apart and at the height of your head.  Keeping your back as relaxed as possible, slowly straighten your elbows while keeping your hips on the floor. You may adjust the placement of your hands to maximize your comfort. As you gain motion, your hands will come more underneath your shoulders.  Hold this position __________ seconds.  Slowly return to lying flat on the floor. Repeat __________ times. Complete this exercise __________ times per day.  STRENGTHENING EXERCISES - Sciatica  These exercises may help you when beginning to rehabilitate your injury. These exercises should be done near your "sweet spot." This is the neutral, low-back arch, somewhere between fully rounded and fully arched, that is your least painful position. When performed in this safe range of motion, these exercises can be used for people who have either a flexion or extension based injury. These exercises may resolve your symptoms with or without further involvement from your physician, physical therapist or athletic trainer. While completing these exercises, remember:   Muscles can gain both the endurance and the strength needed for everyday activities through controlled exercises.  Complete these exercises as instructed by your physician, physical therapist or athletic trainer. Progress with the resistance and repetition exercises only as your caregiver advises.  You may  experience muscle soreness or fatigue, but the pain or discomfort you are trying to eliminate should never worsen during these exercises. If this pain does worsen, stop and make certain you are following the directions exactly. If the pain is still present after adjustments, discontinue the exercise until you can discuss the trouble with your clinician. STRENGTHENING - Deep Abdominals, Pelvic Tilt   Lie on a firm bed or floor. Keeping your legs in front of you, bend your knees so they are both pointed toward the ceiling and your feet are flat on the floor.  Tense your lower abdominal muscles to press your low back into the floor. This motion will rotate your pelvis so that your tail bone is scooping upwards rather than pointing at your feet or into the floor.  With a gentle tension and even breathing, hold this position for __________ seconds. Repeat __________ times. Complete this exercise __________ times per day.  STRENGTHENING - Abdominals, Crunches   Lie on a firm bed or floor. Keeping your legs in front of you, bend your knees so they are both pointed toward the ceiling and your feet are flat on the   floor. Cross your arms over your chest.  Slightly tip your chin down without bending your neck.  Tense your abdominals and slowly lift your trunk high enough to just clear your shoulder blades. Lifting higher can put excessive stress on the low back and does not further strengthen your abdominal muscles.  Control your return to the starting position. Repeat __________ times. Complete this exercise __________ times per day.  STRENGTHENING - Quadruped, Opposite UE/LE Lift  Assume a hands and knees position on a firm surface. Keep your hands under your shoulders and your knees under your hips. You may place padding under your knees for comfort.  Find your neutral spine and gently tense your abdominal muscles so that you can maintain this position. Your shoulders and hips should form a rectangle  that is parallel with the floor and is not twisted.  Keeping your trunk steady, lift your right hand no higher than your shoulder and then your left leg no higher than your hip. Make sure you are not holding your breath. Hold this position __________ seconds.  Continuing to keep your abdominal muscles tense and your back steady, slowly return to your starting position. Repeat with the opposite arm and leg. Repeat __________ times. Complete this exercise __________ times per day.  STRENGTHENING - Abdominals and Quadriceps, Straight Leg Raise   Lie on a firm bed or floor with both legs extended in front of you.  Keeping one leg in contact with the floor, bend the other knee so that your foot can rest flat on the floor.  Find your neutral spine, and tense your abdominal muscles to maintain your spinal position throughout the exercise.  Slowly lift your straight leg off the floor about 6 inches for a count of 15, making sure to not hold your breath.  Still keeping your neutral spine, slowly lower your leg all the way to the floor. Repeat this exercise with each leg __________ times. Complete this exercise __________ times per day. POSTURE AND BODY MECHANICS CONSIDERATIONS - Sciatica Keeping correct posture when sitting, standing or completing your activities will reduce the stress put on different body tissues, allowing injured tissues a chance to heal and limiting painful experiences. The following are general guidelines for improved posture. Your physician or physical therapist will provide you with any instructions specific to your needs. While reading these guidelines, remember:  The exercises prescribed by your provider will help you have the flexibility and strength to maintain correct postures.  The correct posture provides the optimal environment for your joints to work. All of your joints have less wear and tear when properly supported by a spine with good posture. This means you will  experience a healthier, less painful body.  Correct posture must be practiced with all of your activities, especially prolonged sitting and standing. Correct posture is as important when doing repetitive low-stress activities (typing) as it is when doing a single heavy-load activity (lifting). RESTING POSITIONS Consider which positions are most painful for you when choosing a resting position. If you have pain with flexion-based activities (sitting, bending, stooping, squatting), choose a position that allows you to rest in a less flexed posture. You would want to avoid curling into a fetal position on your side. If your pain worsens with extension-based activities (prolonged standing, working overhead), avoid resting in an extended position such as sleeping on your stomach. Most people will find more comfort when they rest with their spine in a more neutral position, neither too rounded nor too   arched. Lying on a non-sagging bed on your side with a pillow between your knees, or on your back with a pillow under your knees will often provide some relief. Keep in mind, being in any one position for a prolonged period of time, no matter how correct your posture, can still lead to stiffness. PROPER SITTING POSTURE In order to minimize stress and discomfort on your spine, you must sit with correct posture Sitting with good posture should be effortless for a healthy body. Returning to good posture is a gradual process. Many people can work toward this most comfortably by using various supports until they have the flexibility and strength to maintain this posture on their own. When sitting with proper posture, your ears will fall over your shoulders and your shoulders will fall over your hips. You should use the back of the chair to support your upper back. Your low back will be in a neutral position, just slightly arched. You may place a small pillow or folded towel at the base of your low back for support.  When  working at a desk, create an environment that supports good, upright posture. Without extra support, muscles fatigue and lead to excessive strain on joints and other tissues. Keep these recommendations in mind: CHAIR:   A chair should be able to slide under your desk when your back makes contact with the back of the chair. This allows you to work closely.  The chair's height should allow your eyes to be level with the upper part of your monitor and your hands to be slightly lower than your elbows. BODY POSITION  Your feet should make contact with the floor. If this is not possible, use a foot rest.  Keep your ears over your shoulders. This will reduce stress on your neck and low back. INCORRECT SITTING POSTURES   If you are feeling tired and unable to assume a healthy sitting posture, do not slouch or slump. This puts excessive strain on your back tissues, causing more damage and pain. Healthier options include:  Using more support, like a lumbar pillow.  Switching tasks to something that requires you to be upright or walking.  Talking a brief walk.  Lying down to rest in a neutral-spine position. PROLONGED STANDING WHILE SLIGHTLY LEANING FORWARD  When completing a task that requires you to lean forward while standing in one place for a long time, place either foot up on a stationary 2-4 inch high object to help maintain the best posture. When both feet are on the ground, the low back tends to lose its slight inward curve. If this curve flattens (or becomes too large), then the back and your other joints will experience too much stress, fatigue more quickly and can cause pain.  CORRECT STANDING POSTURES Proper standing posture should be assumed with all daily activities, even if they only take a few moments, like when brushing your teeth. As in sitting, your ears should fall over your shoulders and your shoulders should fall over your hips. You should keep a slight tension in your abdominal  muscles to brace your spine. Your tailbone should point down to the ground, not behind your body, resulting in an over-extended swayback posture.  INCORRECT STANDING POSTURES  Common incorrect standing postures include a forward head, locked knees and/or an excessive swayback. WALKING Walk with an upright posture. Your ears, shoulders and hips should all line-up. PROLONGED ACTIVITY IN A FLEXED POSITION When completing a task that requires you to bend forward   at your waist or lean over a low surface, try to find a way to stabilize 3 of 4 of your limbs. You can place a hand or elbow on your thigh or rest a knee on the surface you are reaching across. This will provide you more stability so that your muscles do not fatigue as quickly. By keeping your knees relaxed, or slightly bent, you will also reduce stress across your low back. CORRECT LIFTING TECHNIQUES DO :   Assume a wide stance. This will provide you more stability and the opportunity to get as close as possible to the object which you are lifting.  Tense your abdominals to brace your spine; then bend at the knees and hips. Keeping your back locked in a neutral-spine position, lift using your leg muscles. Lift with your legs, keeping your back straight.  Test the weight of unknown objects before attempting to lift them.  Try to keep your elbows locked down at your sides in order get the best strength from your shoulders when carrying an object.  Always ask for help when lifting heavy or awkward objects. INCORRECT LIFTING TECHNIQUES DO NOT:   Lock your knees when lifting, even if it is a small object.  Bend and twist. Pivot at your feet or move your feet when needing to change directions.  Assume that you cannot safely pick up a paperclip without proper posture.   This information is not intended to replace advice given to you by your health care provider. Make sure you discuss any questions you have with your health care provider.     Document Released: 06/19/2005 Document Revised: 11/03/2014 Document Reviewed: 10/01/2008 [employer] Interactive Patient Education 2016 [employer]  

## 2016-02-09 ENCOUNTER — Other Ambulatory Visit: Payer: Self-pay | Admitting: Family Medicine

## 2016-02-09 ENCOUNTER — Telehealth: Payer: Self-pay | Admitting: Family Medicine

## 2016-02-09 LAB — BASIC METABOLIC PANEL
BUN / CREAT RATIO: 13 (ref 12–28)
BUN: 12 mg/dL (ref 8–27)
CHLORIDE: 95 mmol/L — AB (ref 96–106)
CO2: 22 mmol/L (ref 18–29)
CREATININE: 0.95 mg/dL (ref 0.57–1.00)
Calcium: 9.2 mg/dL (ref 8.7–10.3)
GFR calc non Af Amer: 58 mL/min/{1.73_m2} — ABNORMAL LOW (ref >59)
GFR, EST AFRICAN AMERICAN: 67 mL/min/{1.73_m2} (ref >59)
GLUCOSE: 88 mg/dL (ref 65–99)
Potassium: 3.9 mmol/L (ref 3.5–5.2)
SODIUM: 135 mmol/L (ref 134–144)

## 2016-02-09 MED ORDER — BUPROPION HCL ER (SR) 100 MG PO TB12: ORAL_TABLET | ORAL | 1 refills | Status: DC

## 2016-02-09 NOTE — Progress Notes (Signed)
Wellbutrin didn't get to the pharmacy. Sent in today.

## 2016-02-09 NOTE — Telephone Encounter (Signed)
Patient notified

## 2016-02-09 NOTE — Telephone Encounter (Signed)
Please let Miss Melody Green know that her labs look beautiful so she can take her wellbutrin and we'll check her kidney function again next time she comes in. Thanks!

## 2016-02-10 ENCOUNTER — Telehealth: Payer: Self-pay

## 2016-02-10 NOTE — Telephone Encounter (Signed)
Patient called to check the status of her chest x-ray, I checked patients chart, she is actually going for a chest CT scan, I let her know that the order has been placed and that she will receive a call with that appointment.

## 2016-02-14 ENCOUNTER — Telehealth: Payer: Self-pay

## 2016-02-14 NOTE — Telephone Encounter (Signed)
Called patient.  I asked patient if she had questions about her CT Lung Screening. She said yes it has been about a week and she hasn't heard anything. She has finished her medication and still feels bad.  I explained to the patient that this is a screening for cancer from smoking. I send the referral to a person named Burgess Estelle who manages them. It could take a couple of weeks for this to happen.   She then explained to me that she thought it was an x-ray because she was sick and that she told Dr. Wynetta Emery last time she had one of those it messed with her kidneys.   I told patient I would relay this information back to Dr. Wynetta Emery and I'd call her once I figured something out.

## 2016-02-14 NOTE — Telephone Encounter (Signed)
Received a voicemail form patient, she would like to know the status of her Lung CT. Please call patient and let her know what is going on.

## 2016-02-14 NOTE — Telephone Encounter (Signed)
Christin: Can you please get patient scheduled.

## 2016-02-14 NOTE — Telephone Encounter (Signed)
She really needs an appointment because it sounds like she is very confused about a couple of different things. If she is not better, she should get in to see either me or Apolonio Schneiders to listen to her lungs.

## 2016-02-15 ENCOUNTER — Encounter: Payer: Self-pay | Admitting: Emergency Medicine

## 2016-02-15 ENCOUNTER — Emergency Department
Admission: EM | Admit: 2016-02-15 | Discharge: 2016-02-15 | Disposition: A | Discharge status: Home / Self Care | Payer: Commercial Managed Care - HMO | Attending: Emergency Medicine | Admitting: Emergency Medicine

## 2016-02-15 ENCOUNTER — Encounter: Payer: Self-pay | Admitting: Family Medicine

## 2016-02-15 ENCOUNTER — Ambulatory Visit (INDEPENDENT_AMBULATORY_CARE_PROVIDER_SITE_OTHER): Payer: Commercial Managed Care - HMO | Admitting: Family Medicine

## 2016-02-15 ENCOUNTER — Emergency Department: Payer: Commercial Managed Care - HMO

## 2016-02-15 VITALS — BP 127/85 | HR 97 | Temp 98.3°F | Wt 82.0 lb

## 2016-02-15 DIAGNOSIS — F17210 Nicotine dependence, cigarettes, uncomplicated: Secondary | ICD-10-CM | POA: Insufficient documentation

## 2016-02-15 DIAGNOSIS — J449 Chronic obstructive pulmonary disease, unspecified: Secondary | ICD-10-CM | POA: Diagnosis not present

## 2016-02-15 DIAGNOSIS — I129 Hypertensive chronic kidney disease with stage 1 through stage 4 chronic kidney disease, or unspecified chronic kidney disease: Secondary | ICD-10-CM | POA: Insufficient documentation

## 2016-02-15 DIAGNOSIS — N184 Chronic kidney disease, stage 4 (severe): Secondary | ICD-10-CM | POA: Insufficient documentation

## 2016-02-15 DIAGNOSIS — J441 Chronic obstructive pulmonary disease with (acute) exacerbation: Secondary | ICD-10-CM

## 2016-02-15 DIAGNOSIS — R918 Other nonspecific abnormal finding of lung field: Secondary | ICD-10-CM | POA: Diagnosis not present

## 2016-02-15 DIAGNOSIS — Z8673 Personal history of transient ischemic attack (TIA), and cerebral infarction without residual deficits: Secondary | ICD-10-CM | POA: Insufficient documentation

## 2016-02-15 DIAGNOSIS — Z7982 Long term (current) use of aspirin: Secondary | ICD-10-CM | POA: Insufficient documentation

## 2016-02-15 DIAGNOSIS — R079 Chest pain, unspecified: Secondary | ICD-10-CM

## 2016-02-15 DIAGNOSIS — Z720 Tobacco use: Secondary | ICD-10-CM | POA: Diagnosis not present

## 2016-02-15 DIAGNOSIS — J189 Pneumonia, unspecified organism: Secondary | ICD-10-CM | POA: Insufficient documentation

## 2016-02-15 HISTORY — DX: Chronic obstructive pulmonary disease, unspecified: J44.9

## 2016-02-15 HISTORY — DX: Abdominal aortic aneurysm, without rupture: I71.4

## 2016-02-15 HISTORY — DX: Abdominal aortic aneurysm, without rupture, unspecified: I71.40

## 2016-02-15 LAB — CBC
HCT: 45 % (ref 35.0–47.0)
Hemoglobin: 15.1 g/dL (ref 12.0–16.0)
MCH: 30.2 pg (ref 26.0–34.0)
MCHC: 33.5 g/dL (ref 32.0–36.0)
MCV: 90 fL (ref 80.0–100.0)
PLATELETS: 202 10*3/uL (ref 150–440)
RBC: 5 MIL/uL (ref 3.80–5.20)
RDW: 15.6 % — ABNORMAL HIGH (ref 11.5–14.5)
WBC: 8.3 10*3/uL (ref 3.6–11.0)

## 2016-02-15 LAB — BASIC METABOLIC PANEL
Anion gap: 8 (ref 5–15)
BUN: 13 mg/dL (ref 6–20)
CALCIUM: 8.8 mg/dL — AB (ref 8.9–10.3)
CO2: 26 mmol/L (ref 22–32)
Chloride: 97 mmol/L — ABNORMAL LOW (ref 101–111)
Creatinine, Ser: 1.09 mg/dL — ABNORMAL HIGH (ref 0.44–1.00)
GFR calc Af Amer: 56 mL/min — ABNORMAL LOW (ref >60)
GFR, EST NON AFRICAN AMERICAN: 48 mL/min — AB (ref >60)
GLUCOSE: 81 mg/dL (ref 65–99)
POTASSIUM: 3.4 mmol/L — AB (ref 3.5–5.1)
Sodium: 131 mmol/L — ABNORMAL LOW (ref 135–145)

## 2016-02-15 LAB — TROPONIN I

## 2016-02-15 MED ORDER — AMOXICILLIN-POT CLAVULANATE 875-125 MG PO TABS: 1.0000 | ORAL_TABLET | Freq: Two times a day (BID) | ORAL | 0 refills | Status: DC

## 2016-02-15 MED ORDER — AMOXICILLIN-POT CLAVULANATE 875-125 MG PO TABS
1.0000 | ORAL_TABLET | ORAL | Status: AC
  Administered 2016-02-15: 1 via ORAL
  Filled 2016-02-15: qty 1

## 2016-02-15 MED ORDER — ASPIRIN 325 MG PO TABS
325.0000 mg | ORAL_TABLET | Freq: Once | ORAL | Status: AC
  Administered 2016-02-15: 324 mg via ORAL

## 2016-02-15 NOTE — ED Triage Notes (Signed)
Pt had episode of sharp chest pain last night while bathing.  Pt reports pain subsided within a few sedconds.  Pt then had followup COPD appointment at PCP this morning where EKG changes were noted.  Pt denies having symptoms this morning, denies chest pain at this time.

## 2016-02-15 NOTE — Progress Notes (Signed)
BP 127/85 (BP Location: Left Arm, Patient Position: Sitting, Cuff Size: Small)   Pulse 97   Temp 98.3 F (36.8 C)   Wt 82 lb (37.2 kg)   LMP  (Within Years)   SpO2 94%   BMI 14.21 kg/m    Subjective:    Patient ID: Melody Green, female    DOB: 07/27/1940, 75 y.o.   MRN: [id number]  HPI: Melody Green is a 75 y.o. female  Chief Complaint  Patient presents with  . Cough  . Depression    Patient has a PHQ9 sore of 22   Feeling a little better today- had intense pain in her chest and into her back and L shoulder last night. Almost called EMS last night but didn't. Has not noticed any difference in her breathing since her last appointment. Does not feel like the medicine is helping her.   CHEST PAIN Time since onset:about 12 hours- while washing her hair last night Onset: sudden Quality: sharp and stabbing Severity: severe Location: left para substernal Radiation: back and L shoulder Episode duration: several hours Related to exertion: no Activity when pain started: washing her hair  Trauma: no Anxiety/recent stressors: yes Aggravating factors: nothing Alleviating factors: time Status: fluctuating Treatments attempted: nothing  Current pain status: in pain Shortness of breath: yes Cough: yes Nausea: no Diaphoresis: no Heartburn: no Palpitations: no  Relevant past medical, surgical, family and social history reviewed and updated as indicated. Interim medical history since our last visit reviewed. Allergies and medications reviewed and updated.  Review of Systems  Constitutional: Negative.   Respiratory: Positive for cough, chest tightness, shortness of breath and wheezing. Negative for apnea, choking and stridor.   Cardiovascular: Positive for chest pain.  Psychiatric/Behavioral: Positive for dysphoric mood. Negative for agitation, behavioral problems, confusion, decreased concentration, hallucinations, self-injury, sleep disturbance and suicidal ideas. The patient  is nervous/anxious. The patient is not hyperactive.    Per HPI unless specifically indicated above     Objective:    BP 127/85 (BP Location: Left Arm, Patient Position: Sitting, Cuff Size: Small)   Pulse 97   Temp 98.3 F (36.8 C)   Wt 82 lb (37.2 kg)   LMP  (Within Years)   SpO2 94%   BMI 14.21 kg/m   Wt Readings from Last 3 Encounters:  02/15/16 82 lb (37.2 kg)  02/08/16 82 lb (37.2 kg)  01/06/16 82 lb (37.2 kg)    Physical Exam  Constitutional: She is oriented to person, place, and time. She appears well-developed and well-nourished. No distress.  HENT:  Head: Normocephalic and atraumatic.  Right Ear: Hearing normal.  Left Ear: Hearing normal.  Nose: Nose normal.  Eyes: Conjunctivae and lids are normal. Right eye exhibits no discharge. Left eye exhibits no discharge. No scleral icterus.  Cardiovascular: Normal rate, regular rhythm, normal heart sounds and intact distal pulses.  Exam reveals no gallop and no friction rub.   No murmur heard. Pulmonary/Chest: Effort normal. No respiratory distress. She has decreased breath sounds in the right upper field, the right middle field, the right lower field, the left upper field, the left middle field and the left lower field. She has no wheezes. She has no rales. She exhibits no tenderness.  Musculoskeletal: Normal range of motion.  Neurological: She is alert and oriented to person, place, and time.  Skin: Skin is warm, dry and intact. No rash noted. No erythema. No pallor.  Psychiatric: She has a normal mood and affect. Her speech  is normal and behavior is normal. Judgment and thought content normal. Cognition and memory are normal.  Nursing note and vitals reviewed.   Results for orders placed or performed in visit on 46/28/63  Basic metabolic panel  Result Value Ref Range   Glucose 88 65 - 99 mg/dL   BUN 12 8 - 27 mg/dL   Creatinine, Ser 0.95 0.57 - 1.00 mg/dL   GFR calc non Af Amer 58 (L) >59 mL/min/1.73   GFR calc Af Amer  67 >59 mL/min/1.73   BUN/Creatinine Ratio 13 12 - 28   Sodium 135 134 - 144 mmol/L   Potassium 3.9 3.5 - 5.2 mmol/L   Chloride 95 (L) 96 - 106 mmol/L   CO2 22 18 - 29 mmol/L   Calcium 9.2 8.7 - 10.3 mg/dL      Assessment & Plan:   Problem List Items Addressed This Visit      Other   Tobacco abuse    Did not start her wellbutrin because she was afraid of it. Did not discuss this today given her chest pain. Will follow up on this after her hospitalization.        Other Visit Diagnoses    Chest pain, unspecified chest pain type    -  Primary   EKG changes from prior. '324mg'$  of ASA given in the office. O2 administered. EMS called and patient to go to hospital.    Relevant Medications   aspirin tablet 325 mg   Other Relevant Orders   EKG 12-Lead (Completed)   COPD exacerbation (HCC)       Lungs sound better today. Arlyce Harman not done due to chest pain. Will follow up on this following hospitalization.        Follow up plan: Return After hospital visit.

## 2016-02-15 NOTE — Assessment & Plan Note (Signed)
Did not start her wellbutrin because she was afraid of it. Did not discuss this today given her chest pain. Will follow up on this after her hospitalization.

## 2016-02-15 NOTE — ED Provider Notes (Signed)
[facility] Emergency Department Provider Note   ____________________________________________   First MD Initiated Contact with Patient 02/15/16 1106     (approximate)  I have reviewed the triage vital signs and the nursing notes.   HISTORY  Chief Complaint Chest Pain    HPI Melody Green is a 75 y.o. female reports that she saw her doctor today in follow-up after having "bronchitis," for which she was treated for a "wheezing and rattling cough" which has improved. She followed up today, and mentioned to her doctor that last night when she bent over the sink to wash her hair she had a brief 2 seconds of mild pain in the left upper chest. She reports this happens from time to time, and was not at all concerning to her. When she saw her doctor today they didn't EKG and thought it looked slightly abnormal and that she needed to come to the emergency room.  Patient presently reports no symptoms. Her son is also with her and reports that she is much better than she was a week ago. She does not complain of any chest pain to him except for when she mentioned the primary care doctor's office. She has had no pain for the last approximately 16 hours. She reports the pain only last for about 2 seconds and went away on its own without any difficulty.  No leg swelling. No recent hospitalization. No history of blood clots. No pain today. No pain with deep inspiration. No nausea vomiting or abdominal pain.   Past Medical History:  Diagnosis Date  . 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)   . Chronic kidney disease   . Collagenous colitis   . COPD (chronic obstructive pulmonary disease) ([place])   . DDD (degenerative disc disease), cervical   . Depression   . Hernia of abdominal cavity   . Hyperlipidemia   . Osteoporosis   . Stroke ([place])   . Tobacco abuse   . Urine incontinence   . Vitamin B 12 deficiency     Patient Active Problem List   Diagnosis Date Noted  . Tobacco abuse  02/08/2016  . Small bowel obstruction ([place])   . Protein-calorie malnutrition, severe 07/24/2015  . Abdominal pain 06/14/2015  . Malnutrition ([place]) 05/03/2015  . Hypothyroid 05/03/2015  . 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 Positional vertigo of both ears 05/03/2015  . Depression 12/23/2014  . Hyperlipidemia 12/23/2014  . 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 Dizziness 12/04/2014  . Hyponatremia 12/04/2014    Past Surgical History:  Procedure Laterality Date  . ABDOMINAL AORTIC ANEURYSM REPAIR    . ABDOMINAL HYSTERECTOMY    . APPENDECTOMY    . BACK SURGERY  [phone]  . CHOLECYSTECTOMY      Prior to Admission medications   Medication Sig Start Date End Date Taking? Authorizing Provider  albuterol (PROVENTIL HFA;VENTOLIN HFA) 108 (90 Base) MCG/ACT inhaler Inhale 2 puffs into the lungs every 6 (six) hours as needed for wheezing or shortness of breath. 02/08/16   Megan P Johnson, DO  atorvastatin (LIPITOR) 20 MG tablet TAKE 1 TABLET EVERY DAY 12/27/15   Megan P Johnson, DO  buPROPion (WELLBUTRIN SR) 100 MG 12 hr tablet 1 tab qAM for 3 days, then 1 tab BID, pick a day to quit smoking in the 2nd week 02/09/16   Megan P Johnson, DO  calcium carbonate (OS-CAL) 600 MG TABS tablet Take 600 mg by mouth at bedtime.  Historical Provider, MD  clopidogrel (PLAVIX) 75 MG tablet TAKE 1 TABLET ONE TIME DAILY 02/03/16   Guadalupe Maple, MD  cyanocobalamin (,VITAMIN B-12,) 1000 MCG/ML injection Inject 1,000 mcg into the muscle every 30 (thirty) days.    Historical Provider, MD  docusate sodium (COLACE) 100 MG capsule Take 100 mg by mouth 2 (two) times daily.    Historical Provider, MD  levothyroxine (SYNTHROID, LEVOTHROID) 50 MCG tablet Take 50 mcg by mouth every other day. Pt alternates with 45mg.    Historical Provider, MD  levothyroxine (SYNTHROID, LEVOTHROID) 75 MCG tablet Take 75 mcg by mouth every other day. Pt alternates with 566m.    Historical  Provider, MD  lidocaine (LIDODERM) 5 % Place 1 patch onto the skin daily. Remove & Discard patch within 12 hours or as directed by MD 02/08/16   MeValerie RoysDO  LORazepam (ATIVAN) 1 MG tablet Take 0.5 tablets (0.5 mg total) by mouth daily as needed for anxiety. 11/10/15   MaGuadalupe MapleMD  mometasone (NASONEX) 50 MCG/ACT nasal spray Place 2 sprays into the nose daily as needed (for rhinitis).     Historical Provider, MD  omeprazole (PRILOSEC) 20 MG capsule Take 1 capsule (20 mg total) by mouth daily. Take two capsules daily for 3-4 weeks, then return to previous dose of 1 capsule daily 01/07/16   RaVolney AmericanPA-C  triamcinolone cream (KENALOG) 0.1 % Apply 1 application topically 2 (two) times daily.    Historical Provider, MD    Allergies Codeine; Morphine and related; and Ultram [tramadol]  Family History  Problem Relation Age of Onset  . Diabetes Mother   . Diabetes Father     Social History Social History  Substance Use Topics  . Smoking status: Current Every Day Smoker    Packs/day: 0.25    Types: Cigarettes  . Smokeless tobacco: Never Used  . Alcohol use No    Review of Systems Constitutional: No fever/chills. Eyes: No visual changes. ENT: No sore throat. Cardiovascular: Denies chest painToday, only had a brief episode last night. Respiratory: Denies shortness of breath. No recent cough and wheezing, but reports this is much better after taking an antibiotic and prednisone starting last Tuesday. Gastrointestinal: No abdominal pain.  No nausea, no vomiting.  No diarrhea.  No constipation. Genitourinary: Negative for dysuria. Musculoskeletal: Negative for back pain. Skin: Negative for rash. Neurological: Negative for headaches, focal weakness or numbness.  10-point ROS otherwise negative.  ____________________________________________   PHYSICAL EXAM:  VITAL SIGNS: ED Triage Vitals  Enc Vitals Group     BP 02/15/16 1102 (!) 142/95     Pulse Rate  02/15/16 1102 85     Resp 02/15/16 1102 (!) 22     Temp 02/15/16 1102 98.3 F (36.8 C)     Temp src --      SpO2 02/15/16 1058 94 %     Weight 02/15/16 1103 82 lb (37.2 kg)     Height 02/15/16 1103 '5\' 3"'$  (1.6 m)     Head Circumference --      Peak Flow --      Pain Score --      Pain Loc --      Pain Edu? --      Excl. in [place]--     Constitutional: Alert and oriented. Well appearing and in no acute distress.Accompanied by her son. Both resting comfortably and pleasant. Normal oxygen saturation on room air. Eyes: Conjunctivae are normal. PERRL. EOMI. Head:  Atraumatic. Nose: No congestion/rhinnorhea. Mouth/Throat: Mucous membranes are moist.  Oropharynx non-erythematous. Neck: No stridor.   Cardiovascular: Normal rate, regular rhythm. Grossly normal heart sounds.  Good peripheral circulation. Respiratory: Normal respiratory effort.  No retractions. Lungs CTAB. Speaks in full and clear sentences Gastrointestinal: Soft and nontender. No distention. No abdominal bruits. Musculoskeletal: No lower extremity tenderness nor edema.  No joint effusions. No stigmata of DVT. Neurologic:  Normal speech and language. No gross focal neurologic deficits are appreciated. Skin:  Skin is warm, dry and intact. No rash noted. Psychiatric: Mood and affect are normal. Speech and behavior are normal.  ____________________________________________   LABS (all labs ordered are listed, but only abnormal results are displayed)  Labs Reviewed  BASIC METABOLIC PANEL - Abnormal; Notable for the following:       Result Value   Sodium 131 (*)    Potassium 3.4 (*)    Chloride 97 (*)    Creatinine, Ser 1.09 (*)    Calcium 8.8 (*)    GFR calc non Af Amer 48 (*)    GFR calc Af Amer 56 (*)    All other components within normal limits  CBC - Abnormal; Notable for the following:    RDW 15.6 (*)    All other components within normal limits  TROPONIN I    ____________________________________________  EKG  Reviewed and interpreted. 10:50 AM Heart rate [address] 100 QTC 500 Normal sinus rhythm, possible old anterior MI, no evidence to support acute ischemic change noted. There is minimal T-wave inversion in aVL, and slight depression in V6, however in comparison with the patient's previous EKG from 01/06/2016, I see no significant change. There is T-wave flattening noted in aVL, and also a slight hint of inversion in V6. The patient is asymptomatic at present, and I find no evidence support acute ischemia based on this 12-lead alone. ____________________________________________  RADIOLOGY  Dg Chest 2 View  Result Date: 02/15/2016 CLINICAL DATA:  Pneumonia, bronchitis, left side chest pain EXAM: CHEST  2 VIEW COMPARISON:  09/08/2014 FINDINGS: There is infiltrate/ pneumonia in left lower lobe and left perihilar. Follow-up to resolution is recommended. Prior vertebroplasty noted lower thoracic spine. Mild elevation of the right hemidiaphragm. Mild hyperinflation. No pulmonary edema. Atherosclerotic calcifications of thoracic aorta. IMPRESSION: Infiltrate/ pneumonia in left lower lobe and left perihilar. Mild left hilar prominence suspicious for adenopathy. Followup PA and lateral chest X-ray is recommended in 3-4 weeks following trial of antibiotic therapy to ensure resolution and exclude underlying malignancy. Electronically Signed   By: Lahoma Crocker M.D.   On: 02/15/2016 12:17    ____________________________________________   PROCEDURES  Procedure(s) performed: None  Procedures  Critical Care performed: No  ____________________________________________   INITIAL IMPRESSION / ASSESSMENT AND PLAN / ED COURSE  Pertinent labs & imaging results that were available during my care of the patient were reviewed by me and considered in my medical decision making (see chart for details).  Patient transfer evaluation of a brief 2 second  episode of pain in the left chest last night. Completely resolved on its own no lasted 2 seconds. No pleuritic pain, no evidence of DVT. Recently treated for COPD exacerbation or bronchitis, slightly unclear what treatment was exactly 4 but her symptoms of pulmonary disease see me completely resolved today. I do not believe she is at significant risk for pulmonary embolus and her symptoms are very atypical without tachycardia, normal oxygen saturation, and the history given of leaning forward with the pain starting and relieved  by sitting back up sounds musculoskeletal in nature. However, we will evaluate with a single troponin here if normal I plan to reevaluate the patient's symptoms and likely discharge her back to home to follow-up with her primary care doctor.  Clinical Course   ----------------------------------------- 2:24 PM on 02/15/2016 -----------------------------------------  The patient and son both reports she feels fine. Vital signs remained stable. Her chest x-rays reviewed, and there is infiltrate noted and I question if this could be mild ongoing infiltrate, though given the clinical history of improvement, no fever, and a normal white count I seem to favor radiographic changes consistent with a resolving pneumonia. I discussed with the patient and her son, and we will provide her prescription for Augmentin as she has taken Zithromax, and her QTc is mildly prolonged prohibiting fluoroquinolone prescription.  She is very comfortable going home and close follow-up. Careful return precautions discussed. She appears very stable and has no complaint at present. Return precautions and treatment recommendations and follow-up discussed with the patient who is agreeable with the plan.   ____________________________________________   FINAL CLINICAL IMPRESSION(S) / ED DIAGNOSES  Final diagnoses:  Left pulmonary infiltrate on CXR  Community acquired pneumonia      NEW MEDICATIONS  STARTED DURING THIS VISIT:  New Prescriptions   No medications on file     Note:  This document was prepared using Dragon voice recognition software and may include unintentional dictation errors.     Delman Kitten, MD 02/15/16 [phone]

## 2016-02-15 NOTE — Discharge Instructions (Signed)
I suspect you had pneumonia, which is likely improving as your x-ray still shows some evidence of pneumonia versus scarring from a recent infection. Please start an complete antibiotic as prescribed. Follow up closely with your regular doctor, and return to the emergency room if she developed chest pain, shortness of breath, wheezing, fever, feel weak or other new concerns arise

## 2016-02-21 ENCOUNTER — Ambulatory Visit (INDEPENDENT_AMBULATORY_CARE_PROVIDER_SITE_OTHER): Payer: Commercial Managed Care - HMO | Admitting: Family Medicine

## 2016-02-21 ENCOUNTER — Encounter: Payer: Self-pay | Admitting: Family Medicine

## 2016-02-21 VITALS — BP 136/87 | HR 92 | Temp 97.5°F | Ht 63.4 in | Wt 82.0 lb

## 2016-02-21 DIAGNOSIS — I129 Hypertensive chronic kidney disease with stage 1 through stage 4 chronic kidney disease, or unspecified chronic kidney disease: Secondary | ICD-10-CM | POA: Diagnosis not present

## 2016-02-21 DIAGNOSIS — Z13820 Encounter for screening for osteoporosis: Secondary | ICD-10-CM

## 2016-02-21 DIAGNOSIS — E43 Unspecified severe protein-calorie malnutrition: Secondary | ICD-10-CM | POA: Diagnosis not present

## 2016-02-21 DIAGNOSIS — E46 Unspecified protein-calorie malnutrition: Secondary | ICD-10-CM | POA: Diagnosis not present

## 2016-02-21 DIAGNOSIS — E039 Hypothyroidism, unspecified: Secondary | ICD-10-CM

## 2016-02-21 DIAGNOSIS — F329 Major depressive disorder, single episode, unspecified: Secondary | ICD-10-CM

## 2016-02-21 DIAGNOSIS — Z720 Tobacco use: Secondary | ICD-10-CM | POA: Diagnosis not present

## 2016-02-21 DIAGNOSIS — J189 Pneumonia, unspecified organism: Secondary | ICD-10-CM | POA: Diagnosis not present

## 2016-02-21 DIAGNOSIS — Z0000 Encounter for general adult medical examination without abnormal findings: Secondary | ICD-10-CM

## 2016-02-21 DIAGNOSIS — N184 Chronic kidney disease, stage 4 (severe): Secondary | ICD-10-CM

## 2016-02-21 DIAGNOSIS — D692 Other nonthrombocytopenic purpura: Secondary | ICD-10-CM

## 2016-02-21 DIAGNOSIS — E871 Hypo-osmolality and hyponatremia: Secondary | ICD-10-CM | POA: Diagnosis not present

## 2016-02-21 DIAGNOSIS — Z1239 Encounter for other screening for malignant neoplasm of breast: Secondary | ICD-10-CM | POA: Diagnosis not present

## 2016-02-21 DIAGNOSIS — E785 Hyperlipidemia, unspecified: Secondary | ICD-10-CM

## 2016-02-21 DIAGNOSIS — F32A Depression, unspecified: Secondary | ICD-10-CM

## 2016-02-21 LAB — UA/M W/RFLX CULTURE, ROUTINE
BILIRUBIN UA: NEGATIVE
GLUCOSE, UA: NEGATIVE
Ketones, UA: NEGATIVE
LEUKOCYTES UA: NEGATIVE
Nitrite, UA: NEGATIVE
PROTEIN UA: NEGATIVE
RBC UA: NEGATIVE
SPEC GRAV UA: 1.015 (ref 1.005–1.030)
Urobilinogen, Ur: 1 mg/dL (ref 0.2–1.0)
pH, UA: 6 (ref 5.0–7.5)

## 2016-02-21 LAB — MICROALBUMIN, URINE WAIVED
CREATININE, URINE WAIVED: 100 mg/dL (ref 10–300)
MICROALB, UR WAIVED: 30 mg/L — AB (ref 0–19)

## 2016-02-21 NOTE — Patient Instructions (Addendum)
Preventative Services:  Health Risk Assessment and Personalized Prevention Plan: Done today Bone Mass Measurements: Ordered today Breast Cancer Screening: Ordered today CVD Screening: Done today Cervical Cancer Screening: N/A Colon Cancer Screening: Up to date Depression Screening: Done today Diabetes Screening: Done today Glaucoma Screening: See your Eye doctor Hepatitis B vaccine: N/A Hepatitis C screening: N/A HIV Screening: N/A Flu Vaccine: Come get one in Late September/Early October Lung cancer Screening: Ordered Obesity Screening: Done today Pneumonia Vaccines (2): up to date STI Screening: N/AHealth Maintenance, Female Adopting a healthy lifestyle and getting preventive care can go a long way to promote health and wellness. Talk with your health care provider about what schedule of regular examinations is right for you. This is a good chance for you to check in with your provider about disease prevention and staying healthy. In between checkups, there are plenty of things you can do on your own. Experts have done a lot of research about which lifestyle changes and preventive measures are most likely to keep you healthy. Ask your health care provider for more information. WEIGHT AND DIET  Eat a healthy diet  Be sure to include plenty of vegetables, fruits, low-fat dairy products, and lean protein.  Do not eat a lot of foods high in solid fats, added sugars, or salt.  Get regular exercise. This is one of the most important things you can do for your health.  Most adults should exercise for at least 150 minutes each week. The exercise should increase your heart rate and make you sweat (moderate-intensity exercise).  Most adults should also do strengthening exercises at least twice a week. This is in addition to the moderate-intensity exercise.  Maintain a healthy weight  Body mass index (BMI) is a measurement that can be used to identify possible weight problems. It estimates  body fat based on height and weight. Your health care provider can help determine your BMI and help you achieve or maintain a healthy weight.  For females 65 years of age and older:   A BMI below 18.5 is considered underweight.  A BMI of 18.5 to 24.9 is normal.  A BMI of 25 to 29.9 is considered overweight.  A BMI of 30 and above is considered obese.  Watch levels of cholesterol and blood lipids  You should start having your blood tested for lipids and cholesterol at 76 years of age, then have this test every 5 years.  You may need to have your cholesterol levels checked more often if:  Your lipid or cholesterol levels are high.  You are older than 76 years of age.  You are at high risk for heart disease.  CANCER SCREENING   Lung Cancer  Lung cancer screening is recommended for adults 45-49 years old who are at high risk for lung cancer because of a history of smoking.  A yearly low-dose CT scan of the lungs is recommended for people who:  Currently smoke.  Have quit within the past 15 years.  Have at least a 30-pack-year history of smoking. A pack year is smoking an average of one pack of cigarettes a day for 1 year.  Yearly screening should continue until it has been 15 years since you quit.  Yearly screening should stop if you develop a health problem that would prevent you from having lung cancer treatment.  Breast Cancer  Practice breast self-awareness. This means understanding how your breasts normally appear and feel.  It also means doing regular breast self-exams. Let your  health care provider know about any changes, no matter how small.  If you are in your 20s or 30s, you should have a clinical breast exam (CBE) by a health care provider every 1-3 years as part of a regular health exam.  If you are 8 or older, have a CBE every year. Also consider having a breast X-ray (mammogram) every year.  If you have a family history of breast cancer, talk to your  health care provider about genetic screening.  If you are at high risk for breast cancer, talk to your health care provider about having an MRI and a mammogram every year.  Breast cancer gene (BRCA) assessment is recommended for women who have family members with BRCA-related cancers. BRCA-related cancers include:  Breast.  Ovarian.  Tubal.  Peritoneal cancers.  Results of the assessment will determine the need for genetic counseling and BRCA1 and BRCA2 testing. Cervical Cancer Your health care provider may recommend that you be screened regularly for cancer of the pelvic organs (ovaries, uterus, and vagina). This screening involves a pelvic examination, including checking for microscopic changes to the surface of your cervix (Pap test). You may be encouraged to have this screening done every 3 years, beginning at age 31.  For women ages 81-65, health care providers may recommend pelvic exams and Pap testing every 3 years, or they may recommend the Pap and pelvic exam, combined with testing for human papilloma virus (HPV), every 5 years. Some types of HPV increase your risk of cervical cancer. Testing for HPV may also be done on women of any age with unclear Pap test results.  Other health care providers may not recommend any screening for nonpregnant women who are considered low risk for pelvic cancer and who do not have symptoms. Ask your health care provider if a screening pelvic exam is right for you.  If you have had past treatment for cervical cancer or a condition that could lead to cancer, you need Pap tests and screening for cancer for at least 20 years after your treatment. If Pap tests have been discontinued, your risk factors (such as having a new sexual partner) need to be reassessed to determine if screening should resume. Some women have medical problems that increase the chance of getting cervical cancer. In these cases, your health care provider may recommend more frequent  screening and Pap tests. Colorectal Cancer  This type of cancer can be detected and often prevented.  Routine colorectal cancer screening usually begins at 76 years of age and continues through 76 years of age.  Your health care provider may recommend screening at an earlier age if you have risk factors for colon cancer.  Your health care provider may also recommend using home test kits to check for hidden blood in the stool.  A small camera at the end of a tube can be used to examine your colon directly (sigmoidoscopy or colonoscopy). This is done to check for the earliest forms of colorectal cancer.  Routine screening usually begins at age 62.  Direct examination of the colon should be repeated every 5-10 years through 76 years of age. However, you may need to be screened more often if early forms of precancerous polyps or small growths are found. Skin Cancer  Check your skin from head to toe regularly.  Tell your health care provider about any new moles or changes in moles, especially if there is a change in a mole's shape or color.  Also tell your health  care provider if you have a mole that is larger than the size of a pencil eraser.  Always use sunscreen. Apply sunscreen liberally and repeatedly throughout the day.  Protect yourself by wearing long sleeves, pants, a wide-brimmed hat, and sunglasses whenever you are outside. HEART DISEASE, DIABETES, AND HIGH BLOOD PRESSURE   High blood pressure causes heart disease and increases the risk of stroke. High blood pressure is more likely to develop in:  People who have blood pressure in the high end of the normal range (130-139/85-89 mm Hg).  People who are overweight or obese.  People who are African American.  If you are 56-73 years of age, have your blood pressure checked every 3-5 years. If you are 44 years of age or older, have your blood pressure checked every year. You should have your blood pressure measured twice--once  when you are at a hospital or clinic, and once when you are not at a hospital or clinic. Record the average of the two measurements. To check your blood pressure when you are not at a hospital or clinic, you can use:  An automated blood pressure machine at a pharmacy.  A home blood pressure monitor.  If you are between 74 years and 83 years old, ask your health care provider if you should take aspirin to prevent strokes.  Have regular diabetes screenings. This involves taking a blood sample to check your fasting blood sugar level.  If you are at a normal weight and have a low risk for diabetes, have this test once every three years after 76 years of age.  If you are overweight and have a high risk for diabetes, consider being tested at a younger age or more often. PREVENTING INFECTION  Hepatitis B  If you have a higher risk for hepatitis B, you should be screened for this virus. You are considered at high risk for hepatitis B if:  You were born in a country where hepatitis B is common. Ask your health care provider which countries are considered high risk.  Your parents were born in a high-risk country, and you have not been immunized against hepatitis B (hepatitis B vaccine).  You have HIV or AIDS.  You use needles to inject street drugs.  You live with someone who has hepatitis B.  You have had sex with someone who has hepatitis B.  You get hemodialysis treatment.  You take certain medicines for conditions, including cancer, organ transplantation, and autoimmune conditions. Hepatitis C  Blood testing is recommended for:  Everyone born from [birth year] through 1966.  Anyone with known risk factors for hepatitis C. Sexually transmitted infections (STIs)  You should be screened for sexually transmitted infections (STIs) including gonorrhea and chlamydia if:  You are sexually active and are younger than 76 years of age.  You are older than 76 years of age and your health care  provider tells you that you are at risk for this type of infection.  Your sexual activity has changed since you were last screened and you are at an increased risk for chlamydia or gonorrhea. Ask your health care provider if you are at risk.  If you do not have HIV, but are at risk, it may be recommended that you take a prescription medicine daily to prevent HIV infection. This is called pre-exposure prophylaxis (PrEP). You are considered at risk if:  You are sexually active and do not regularly use condoms or know the HIV status of your partner(s).  You take  drugs by injection.  You are sexually active with a partner who has HIV. Talk with your health care provider about whether you are at high risk of being infected with HIV. If you choose to begin PrEP, you should first be tested for HIV. You should then be tested every 3 months for as long as you are taking PrEP.  PREGNANCY   If you are premenopausal and you may become pregnant, ask your health care provider about preconception counseling.  If you may become pregnant, take 400 to 800 micrograms (mcg) of folic acid every day.  If you want to prevent pregnancy, talk to your health care provider about birth control (contraception). OSTEOPOROSIS AND MENOPAUSE   Osteoporosis is a disease in which the bones lose minerals and strength with aging. This can result in serious bone fractures. Your risk for osteoporosis can be identified using a bone density scan.  If you are 43 years of age or older, or if you are at risk for osteoporosis and fractures, ask your health care provider if you should be screened.  Ask your health care provider whether you should take a calcium or vitamin D supplement to lower your risk for osteoporosis.  Menopause may have certain physical symptoms and risks.  Hormone replacement therapy may reduce some of these symptoms and risks. Talk to your health care provider about whether hormone replacement therapy is  right for you.  HOME CARE INSTRUCTIONS   Schedule regular health, dental, and eye exams.  Stay current with your immunizations.   Do not use any tobacco products including cigarettes, chewing tobacco, or electronic cigarettes.  If you are pregnant, do not drink alcohol.  If you are breastfeeding, limit how much and how often you drink alcohol.  Limit alcohol intake to no more than 1 drink per day for nonpregnant women. One drink equals 12 ounces of beer, 5 ounces of wine, or 1 ounces of hard liquor.  Do not use street drugs.  Do not share needles.  Ask your health care provider for help if you need support or information about quitting drugs.  Tell your health care provider if you often feel depressed.  Tell your health care provider if you have ever been abused or do not feel safe at home.   This information is not intended to replace advice given to you by your health care provider. Make sure you discuss any questions you have with your health care provider.   Document Released: 01/02/2011 Document Revised: 07/10/2014 Document Reviewed: 05/21/2013 Elsevier Interactive Patient Education [employer]. Menopause is a normal process in which your reproductive ability comes to an end. This process happens gradually over a span of months to years, usually between the ages of 37 and 13. Menopause is complete when you have missed 12 consecutive menstrual periods. It is important to talk with your health care provider about some of the most common conditions that affect postmenopausal women, such as heart disease, cancer, and bone loss (osteoporosis). Adopting a healthy lifestyle and getting preventive care can help to promote your health and wellness. Those actions can also lower your chances of developing some of these common conditions. WHAT SHOULD I KNOW ABOUT MENOPAUSE? During menopause, you may experience a number of symptoms, such as:  Moderate-to-severe hot  flashes.  Night sweats.  Decrease in sex drive.  Mood swings.  Headaches.  Tiredness.  Irritability.  Memory problems.  Insomnia. Choosing to treat or not to treat menopausal changes is an individual decision  that you make with your health care provider. WHAT SHOULD I KNOW ABOUT HORMONE REPLACEMENT THERAPY AND SUPPLEMENTS? Hormone therapy products are effective for treating symptoms that are associated with menopause, such as hot flashes and night sweats. Hormone replacement carries certain risks, especially as you become older. If you are thinking about using estrogen or estrogen with progestin treatments, discuss the benefits and risks with your health care provider. WHAT SHOULD I KNOW ABOUT HEART DISEASE AND STROKE? Heart disease, heart attack, and stroke become more likely as you age. This may be due, in part, to the hormonal changes that your body experiences during menopause. These can affect how your body processes dietary fats, triglycerides, and cholesterol. Heart attack and stroke are both medical emergencies. There are many things that you can do to help prevent heart disease and stroke:  Have your blood pressure checked at least every 1-2 years. High blood pressure causes heart disease and increases the risk of stroke.  If you are 15-77 years old, ask your health care provider if you should take aspirin to prevent a heart attack or a stroke.  Do not use any tobacco products, including cigarettes, chewing tobacco, or electronic cigarettes. If you need help quitting, ask your health care provider.  It is important to eat a healthy diet and maintain a healthy weight.  Be sure to include plenty of vegetables, fruits, low-fat dairy products, and lean protein.  Avoid eating foods that are high in solid fats, added sugars, or salt (sodium).  Get regular exercise. This is one of the most important things that you can do for your health.  Try to exercise for at least 150  minutes each week. The type of exercise that you do should increase your heart rate and make you sweat. This is known as moderate-intensity exercise.  Try to do strengthening exercises at least twice each week. Do these in addition to the moderate-intensity exercise.  Know your numbers.Ask your health care provider to check your cholesterol and your blood glucose. Continue to have your blood tested as directed by your health care provider. WHAT SHOULD I KNOW ABOUT CANCER SCREENING? There are several types of cancer. Take the following steps to reduce your risk and to catch any cancer development as early as possible. Breast Cancer  Practice breast self-awareness.  This means understanding how your breasts normally appear and feel.  It also means doing regular breast self-exams. Let your health care provider know about any changes, no matter how small.  If you are 56 or older, have a clinician do a breast exam (clinical breast exam or CBE) every year. Depending on your age, family history, and medical history, it may be recommended that you also have a yearly breast X-ray (mammogram).  If you have a family history of breast cancer, talk with your health care provider about genetic screening.  If you are at high risk for breast cancer, talk with your health care provider about having an MRI and a mammogram every year.  Breast cancer (BRCA) gene test is recommended for women who have family members with BRCA-related cancers. Results of the assessment will determine the need for genetic counseling and BRCA1 and for BRCA2 testing. BRCA-related cancers include these types:  Breast. This occurs in males or females.  Ovarian.  Tubal. This may also be called fallopian tube cancer.  Cancer of the abdominal or pelvic lining (peritoneal cancer).  Prostate.  Pancreatic. Cervical, Uterine, and Ovarian Cancer Your health care provider may recommend that  you be screened regularly for cancer of  the pelvic organs. These include your ovaries, uterus, and vagina. This screening involves a pelvic exam, which includes checking for microscopic changes to the surface of your cervix (Pap test).  For women ages 21-65, health care providers may recommend a pelvic exam and a Pap test every three years. For women ages 3-65, they may recommend the Pap test and pelvic exam, combined with testing for human papilloma virus (HPV), every five years. Some types of HPV increase your risk of cervical cancer. Testing for HPV may also be done on women of any age who have unclear Pap test results.  Other health care providers may not recommend any screening for nonpregnant women who are considered low risk for pelvic cancer and have no symptoms. Ask your health care provider if a screening pelvic exam is right for you.  If you have had past treatment for cervical cancer or a condition that could lead to cancer, you need Pap tests and screening for cancer for at least 20 years after your treatment. If Pap tests have been discontinued for you, your risk factors (such as having a new sexual partner) need to be reassessed to determine if you should start having screenings again. Some women have medical problems that increase the chance of getting cervical cancer. In these cases, your health care provider may recommend that you have screening and Pap tests more often.  If you have a family history of uterine cancer or ovarian cancer, talk with your health care provider about genetic screening.  If you have vaginal bleeding after reaching menopause, tell your health care provider.  There are currently no reliable tests available to screen for ovarian cancer. Lung Cancer Lung cancer screening is recommended for adults 24-65 years old who are at high risk for lung cancer because of a history of smoking. A yearly low-dose CT scan of the lungs is recommended if you:  Currently smoke.  Have a history of at least 30  pack-years of smoking and you currently smoke or have quit within the past 15 years. A pack-year is smoking an average of one pack of cigarettes per day for one year. Yearly screening should:  Continue until it has been 15 years since you quit.  Stop if you develop a health problem that would prevent you from having lung cancer treatment. Colorectal Cancer  This type of cancer can be detected and can often be prevented.  Routine colorectal cancer screening usually begins at age 4 and continues through age 83.  If you have risk factors for colon cancer, your health care provider may recommend that you be screened at an earlier age.  If you have a family history of colorectal cancer, talk with your health care provider about genetic screening.  Your health care provider may also recommend using home test kits to check for hidden blood in your stool.  A small camera at the end of a tube can be used to examine your colon directly (sigmoidoscopy or colonoscopy). This is done to check for the earliest forms of colorectal cancer.  Direct examination of the colon should be repeated every 5-10 years until age 52. However, if early forms of precancerous polyps or small growths are found or if you have a family history or genetic risk for colorectal cancer, you may need to be screened more often. Skin Cancer  Check your skin from head to toe regularly.  Monitor any moles. Be sure to tell your health  care provider:  About any new moles or changes in moles, especially if there is a change in a mole's shape or color.  If you have a mole that is larger than the size of a pencil eraser.  If any of your family members has a history of skin cancer, especially at a young age, talk with your health care provider about genetic screening.  Always use sunscreen. Apply sunscreen liberally and repeatedly throughout the day.  Whenever you are outside, protect yourself by wearing long sleeves, pants, a  wide-brimmed hat, and sunglasses. WHAT SHOULD I KNOW ABOUT OSTEOPOROSIS? Osteoporosis is a condition in which bone destruction happens more quickly than new bone creation. After menopause, you may be at an increased risk for osteoporosis. To help prevent osteoporosis or the bone fractures that can happen because of osteoporosis, the following is recommended:  If you are 38-32 years old, get at least 1,000 mg of calcium and at least 600 mg of vitamin D per day.  If you are older than age 27 but younger than age 33, get at least 1,200 mg of calcium and at least 600 mg of vitamin D per day.  If you are older than age 39, get at least 1,200 mg of calcium and at least 800 mg of vitamin D per day. Smoking and excessive alcohol intake increase the risk of osteoporosis. Eat foods that are rich in calcium and vitamin D, and do weight-bearing exercises several times each week as directed by your health care provider. WHAT SHOULD I KNOW ABOUT HOW MENOPAUSE AFFECTS [place]? Depression may occur at any age, but it is more common as you become older. Common symptoms of depression include:  Low or sad mood.  Changes in sleep patterns.  Changes in appetite or eating patterns.  Feeling an overall lack of motivation or enjoyment of activities that you previously enjoyed.  Frequent crying spells. Talk with your health care provider if you think that you are experiencing depression. WHAT SHOULD I KNOW ABOUT IMMUNIZATIONS? It is important that you get and maintain your immunizations. These include:  Tetanus, diphtheria, and pertussis (Tdap) booster vaccine.  Influenza every year before the flu season begins.  Pneumonia vaccine.  Shingles vaccine. Your health care provider may also recommend other immunizations.   This information is not intended to replace advice given to you by your health care provider. Make sure you discuss any questions you have with your health care provider.   Document  Released: 08/11/2005 Document Revised: 07/10/2014 Document Reviewed: 02/19/2014 Elsevier Interactive Patient Education 2016 [place] Directive Advance directives are the legal documents that allow you to make choices about your health care and medical treatment if you cannot speak for yourself. Advance directives are a way for you to communicate your wishes to family, friends, and health care providers. The specified people can then convey your decisions about end-of-life care to avoid confusion if you should become unable to communicate. Ideally, the process of discussing and writing advance directives should happen over time rather than making decisions all at once. Advance directives can be modified as your situation changes, and you can change your mind at any time, even after you have signed the advance directives. Each state has its own laws regarding advance directives. You may want to check with your health care provider, attorney, or state representative about the law in your state. Below are some examples of advance directives. LIVING WILL A living will is a set of instructions documenting  your wishes about medical care when you cannot care for yourself. It is used if you become:  Terminally ill.  Incapacitated.  Unable to communicate.  Unable to make decisions. Items to consider in your living will include:  The use or non-use of life-sustaining equipment, such as dialysis machines and breathing machines (ventilators).  A do not resuscitate (DNR) order, which is the instruction not to use cardiopulmonary resuscitation (CPR) if breathing or heartbeat stops.  Tube feeding.  Withholding of food and fluids.  Comfort (palliative) care when the goal becomes comfort rather than a cure.  Organ and tissue donation. A living will does not give instructions about distribution of your money and property if you should pass away. It is advisable to seek the expert advice of a  lawyer in drawing up a will regarding your possessions. Decisions about taxes, beneficiaries, and asset distribution will be legally binding. This process can relieve your family and friends of any burdens surrounding disputes or questions that may come up about the allocation of your assets. DO NOT RESUSCITATE (DNR) A do not resuscitate (DNR) order is a request to not have CPR in the event that your heart stops beating or you stop breathing. Unless given other instructions, a health care provider will try to help any patient whose heart has stopped or who has stopped breathing.  HEALTH CARE PROXY AND DURABLE POWER OF ATTORNEY FOR HEALTH CARE A health care proxy is a person (agent) appointed to make medical decisions for you if you cannot. Generally, people choose someone they know well and trust to represent their preferences when they can no longer do so. You should be sure to ask this person for agreement to act as your agent. An agent may have to exercise judgment in the event of a medical decision for which your wishes are not known. The durable power of attorney for health care is the legal document that names your health care proxy. Once written, it should be:  Signed.  Notarized.  Dated.  Copied.  Witnessed.  Incorporated into your medical record. You may also want to appoint someone to manage your financial affairs if you cannot. This is called a durable power of attorney for finances. It is a separate legal document from the durable power of attorney for health care. You may choose the same person or someone different from your health care proxy to act as your agent in financial matters.   This information is not intended to replace advice given to you by your health care provider. Make sure you discuss any questions you have with your health care provider.   Document Released: 09/26/2007 Document Revised: 06/24/2013 Document Reviewed: 11/06/2012 Elsevier Interactive Patient  Education [employer].

## 2016-02-21 NOTE — Assessment & Plan Note (Signed)
Quit date set for 03/07/16. Continue wellbutrin. Continue to monitor.

## 2016-02-21 NOTE — Assessment & Plan Note (Signed)
Under good control. Continue current regimen. Continue to monitor.  

## 2016-02-21 NOTE — Assessment & Plan Note (Signed)
Discussed with patient. Monitor. Reassured.

## 2016-02-21 NOTE — Assessment & Plan Note (Signed)
Continue supplementation. Continue to monitor. Weight stable.

## 2016-02-21 NOTE — Assessment & Plan Note (Signed)
Rechecking levels today. Await results.  

## 2016-02-21 NOTE — Assessment & Plan Note (Signed)
Better on her wellbutrin. Continue current regimen. Continue to monitor. Recheck in a couple weeks.

## 2016-02-21 NOTE — Progress Notes (Signed)
BP 136/87 (BP Location: Left Arm, Patient Position: Sitting, Cuff Size: Small)   Pulse 92   Temp 97.5 F (36.4 C)   Ht 5' 3.4" (1.61 m)   Wt 82 lb (37.2 kg)   LMP  (Approximate)   SpO2 94%   BMI 14.34 kg/m    Subjective:    Patient ID: Melody Green, female    DOB: 1941/05/22, 75 y.o.   MRN: [id number]  HPI: Melody Green is a 75 y.o. female presenting on 02/21/2016 for comprehensive medical examination. Current medical complaints include:  ER FOLLOW UP Time since discharge: 6 days Hospital/facility: [facility] Diagnosis: L Pneumonia Procedures/tests: labs, CXR, EKG Consultants: none New medications: Augmentin Discharge instructions:  Follow up x-ray 3-4 weeks, follow up here Status: stable- still feeling pretty rough. Couldn't lay on her back. Still feeling pretty rough, still feeling SOB and not like herself, medicine is treating her OK  SMOKING CESSATION Smoking Status: current every day smoker Smoking Amount: couple of cigarettes Smoking Onset: 50 years ago Smoking Quit Date: 03/07/16 Smoking triggers: cigarette, coffee, someone else smoking Type of tobacco use: cigarettes Children in the house: no Other household members who smoke: yes Treatments attempted: wellbutrin- does well Pneumovax: up to date  DEPRESSION Mood status: better Satisfied with current treatment?: yes Symptom severity: moderate  Duration of current treatment : just started wellbutrin Side effects: no Medication compliance: excellent compliance Psychotherapy/counseling: no  Previous psychiatric medications: wellbutrin Depressed mood: yes Anxious mood: yes Anhedonia: yes Significant weight loss or gain: no Insomnia: no  Fatigue: yes Feelings of worthlessness or guilt: yes Impaired concentration/indecisiveness: yes Suicidal ideations: no Hopelessness: yes Crying spells: yes Depression screen [facility] 2/9 02/21/2016 02/15/2016 02/08/2016 12/04/2014  Decreased Interest '3 3 3 '$ 0  Down, Depressed, Hopeless '3 3  3 1  '$ PHQ - 2 Score '6 6 6 1  '$ Altered sleeping 0 2 2 -  Tired, decreased energy '3 3 3 '$ -  Change in appetite '3 3 3 '$ -  Feeling bad or failure about yourself  '3 3 3 '$ -  Trouble concentrating 0 3 3 -  Moving slowly or fidgety/restless 3 2 0 -  Suicidal thoughts 0 0 0 -  PHQ-9 Score '18 22 20 '$ -  Difficult doing work/chores Very difficult - - -   HYPERTENSION / HYPERLIPIDEMIA Satisfied with current treatment? yes Duration of hypertension: chronic BP monitoring frequency: not checking BP medication side effects: no Duration of hyperlipidemia: chronic Cholesterol medication side effects: no Cholesterol supplements: none Past cholesterol medications: atorvastastin Medication compliance: excellent compliance Aspirin: no Recent stressors: no Recurrent headaches: no Visual changes: no Palpitations: no Dyspnea: yes Chest pain: no Lower extremity edema: no Dizzy/lightheaded: no  HYPOTHYROIDISM Thyroid control status:stable Satisfied with current treatment? yes Medication side effects: no Medication compliance: excellent compliance Recent dose adjustment:no Fatigue: yes Cold intolerance: yes Heat intolerance: no Weight gain: no Weight loss: no Constipation: yes Diarrhea/loose stools: no Palpitations: no Lower extremity edema: no Anxiety/depressed mood: yes  She currently lives with: son Menopausal Symptoms: yes  Functional Status Survey:    Fall Risk  02/21/2016 02/15/2016 12/04/2014  Falls in the past year? Yes No Yes  Number falls in past yr: - - 2 or more  Injury with Fall? - - Yes  Risk for fall due to : History of fall(s);Impaired balance/gait - Other (Comment)  Risk for fall due to (comments): - - STROKE    Depression Screen Depression screen [facility] 2/9 02/21/2016 02/15/2016 02/08/2016 12/04/2014  Decreased Interest 3 3  3 0  Down, Depressed, Hopeless '3 3 3 1  '$ PHQ - 2 Score '6 6 6 1  '$ Altered sleeping 0 2 2 -  Tired, decreased energy '3 3 3 '$ -  Change in appetite '3 3 3 '$ -    Feeling bad or failure about yourself  '3 3 3 '$ -  Trouble concentrating 0 3 3 -  Moving slowly or fidgety/restless 3 2 0 -  Suicidal thoughts 0 0 0 -  PHQ-9 Score '18 22 20 '$ -  Difficult doing work/chores Very difficult - - -   Advanced Directives Does patient have a HCPOA?    no Does patient have a living will or MOST form?  yes  Past Medical History:  Past Medical History:  Diagnosis Date  . Abdominal aneurysm ([place])   . Chronic kidney disease   . Collagenous colitis   . COPD (chronic obstructive pulmonary disease) ([place])   . DDD (degenerative disc disease), cervical   . Depression   . Hernia of abdominal cavity   . Hyperlipidemia   . Osteoporosis   . Pneumonia   . Stroke ([place])   . Tobacco abuse   . Urine incontinence   . Vitamin B 12 deficiency     Surgical History:  Past Surgical History:  Procedure Laterality Date  . ABDOMINAL AORTIC ANEURYSM REPAIR    . ABDOMINAL HYSTERECTOMY    . APPENDECTOMY    . BACK SURGERY  [phone]  . CHOLECYSTECTOMY      Medications:  Current Outpatient Prescriptions on File Prior to Visit  Medication Sig  . albuterol (PROVENTIL HFA;VENTOLIN HFA) 108 (90 Base) MCG/ACT inhaler Inhale 2 puffs into the lungs every 6 (six) hours as needed for wheezing or shortness of breath.  Marland Kitchen amoxicillin-clavulanate (AUGMENTIN) 875-125 MG tablet Take 1 tablet by mouth 2 (two) times daily.  Marland Kitchen atorvastatin (LIPITOR) 20 MG tablet TAKE 1 TABLET EVERY DAY  . buPROPion (WELLBUTRIN SR) 100 MG 12 hr tablet 1 tab qAM for 3 days, then 1 tab BID, pick a day to quit smoking in the 2nd week  . calcium carbonate (OS-CAL) 600 MG TABS tablet Take 600 mg by mouth at bedtime.   . clopidogrel (PLAVIX) 75 MG tablet TAKE 1 TABLET ONE TIME DAILY  . cyanocobalamin (,VITAMIN B-12,) 1000 MCG/ML injection Inject 1,000 mcg into the muscle every 30 (thirty) days.  Marland Kitchen levothyroxine (SYNTHROID, LEVOTHROID) 50 MCG tablet Take 50 mcg by mouth every other day. Pt alternates with 51mg.  .Marland Kitchen levothyroxine (SYNTHROID, LEVOTHROID) 75 MCG tablet Take 75 mcg by mouth every other day. Pt alternates with 532m.  . Marland KitchenORazepam (ATIVAN) 1 MG tablet Take 0.5 tablets (0.5 mg total) by mouth daily as needed for anxiety.  . mometasone (NASONEX) 50 MCG/ACT nasal spray Place 2 sprays into the nose daily as needed (for rhinitis).   . Marland Kitchenmeprazole (PRILOSEC) 20 MG capsule Take 1 capsule (20 mg total) by mouth daily. Take two capsules daily for 3-4 weeks, then return to previous dose of 1 capsule daily  . triamcinolone cream (KENALOG) 0.1 % Apply 1 application topically 2 (two) times daily.  . Marland Kitchenidocaine (LIDODERM) 5 % Place 1 patch onto the skin daily. Remove & Discard patch within 12 hours or as directed by MD (Patient not taking: Reported on 02/21/2016)   No current facility-administered medications on file prior to visit.    Allergies:  Allergies  Allergen Reactions  . Codeine Nausea And Vomiting  . Morphine And Related Nausea And Vomiting  . Ultram [Tramadol]  Itching    Social History:  Social History   Social History  . Marital status: Married    Spouse name: N/A  . Number of children: N/A  . Years of education: N/A   Occupational History  . Not on file.   Social History Main Topics  . Smoking status: Current Every Day Smoker    Packs/day: 0.25    Types: Cigarettes  . Smokeless tobacco: Never Used  . Alcohol use No  . Drug use: No  . Sexual activity: No   Other Topics Concern  . Not on file   Social History Narrative  . No narrative on file   History  Smoking Status  . Current Every Day Smoker  . Packs/day: 0.25  . Types: Cigarettes  Smokeless Tobacco  . Never Used   History  Alcohol Use No    Family History:  Family History  Problem Relation Age of Onset  . Diabetes Mother   . Diabetes Father     Past medical history, surgical history, medications, allergies, family history and social history reviewed with patient today and changes made to appropriate areas  of the chart.   Review of Systems  Constitutional: Positive for chills and malaise/fatigue. Negative for diaphoresis, fever and weight loss.  HENT: Negative.        Bad tooth  Eyes: Negative.   Respiratory: Positive for cough, shortness of breath and wheezing. Negative for hemoptysis and sputum production.   Cardiovascular: Negative.   Gastrointestinal: Positive for constipation. Negative for abdominal pain, blood in stool, diarrhea, heartburn, melena, nausea and vomiting.  Genitourinary: Negative.   Musculoskeletal: Positive for back pain, falls and myalgias. Negative for joint pain and neck pain.  Skin: Negative.   Neurological: Negative.  Negative for weakness.  Endo/Heme/Allergies: Negative for environmental allergies and polydipsia. Bruises/bleeds easily.  Psychiatric/Behavioral: Positive for depression. Negative for hallucinations, memory loss, substance abuse and suicidal ideas. The patient is not nervous/anxious and does not have insomnia.    All other ROS negative except what is listed above and in the HPI.      Objective:    BP 136/87 (BP Location: Left Arm, Patient Position: Sitting, Cuff Size: Small)   Pulse 92   Temp 97.5 F (36.4 C)   Ht 5' 3.4" (1.61 m)   Wt 82 lb (37.2 kg)   LMP  (Approximate)   SpO2 94%   BMI 14.34 kg/m   Wt Readings from Last 3 Encounters:  02/21/16 82 lb (37.2 kg)  02/15/16 82 lb (37.2 kg)  02/15/16 82 lb (37.2 kg)     Hearing Screening   '125Hz'$  '250Hz'$  '500Hz'$  '1000Hz'$  '2000Hz'$  '3000Hz'$  '4000Hz'$  '6000Hz'$  '8000Hz'$   Right ear:   Fail Fail Fail  Fail    Left ear:   Fail Fail 40  Fail      Visual Acuity Screening   Right eye Left eye Both eyes  Without correction:     With correction: 20/100 20/40 20/40    Physical Exam  Constitutional: She is oriented to person, place, and time. She appears well-developed and well-nourished. No distress.  HENT:  Head: Normocephalic and atraumatic.  Right Ear: Hearing, tympanic membrane, external ear and ear canal  normal.  Left Ear: Hearing, tympanic membrane, external ear and ear canal normal.  Nose: Nose normal.  Mouth/Throat: Uvula is midline, oropharynx is clear and moist and mucous membranes are normal. No oropharyngeal exudate.  Eyes: Conjunctivae, EOM and lids are normal. Pupils are equal, round, and reactive to light. Right  eye exhibits no discharge. Left eye exhibits no discharge. No scleral icterus.  Neck: Normal range of motion. Neck supple. No JVD present. No tracheal deviation present. No thyromegaly present.  Cardiovascular: Normal rate, regular rhythm, normal heart sounds and intact distal pulses.  Exam reveals no gallop and no friction rub.   No murmur heard. Pulmonary/Chest: Effort normal. No stridor. No respiratory distress. She has decreased breath sounds in the left middle field and the left lower field. She has no wheezes. She has no rales. She exhibits no tenderness.  Abdominal: Soft. Bowel sounds are normal. She exhibits no distension and no mass. There is no tenderness. There is no rebound and no guarding.  Genitourinary:  Genitourinary Comments: Deferred with shared decision making  Musculoskeletal: Normal range of motion. She exhibits no edema, tenderness or deformity.  Lymphadenopathy:    She has no cervical adenopathy.  Neurological: She is alert and oriented to person, place, and time. She has normal reflexes. She displays normal reflexes. No cranial nerve deficit. She exhibits normal muscle tone. Coordination normal.  Skin: Skin is warm, dry and intact. No rash noted. She is not diaphoretic. No erythema. No pallor.  Psychiatric: She has a normal mood and affect. Her speech is normal and behavior is normal. Judgment and thought content normal. Cognition and memory are normal.  Nursing note and vitals reviewed.  Cognitive Testing - 6-CIT  Correct? Score   What year is it? yes 0 Yes = 0    No = 4  What month is it? yes 0 Yes = 0    No = 3  Remember:     Pia [address], [place], [place]     What time is it? yes 0 Yes = 0    No = 3  Count backwards from 20 to 1 yes 0 Correct = 0    1 error = 2   More than 1 error = 4  Say the months of the year in reverse. yes 0 Correct = 0    1 error = 2   More than 1 error = 4  What address did I ask you to remember? yes 4 Correct = 0  1 error = 2    2 error = 4    3 error = 6    4 error = 8    All wrong = 10       TOTAL SCORE  4/28   Interpretation:  Normal  Normal (0-7) Abnormal (8-28)   Results for orders placed or performed during the hospital encounter of 25/95/63  Basic metabolic panel  Result Value Ref Range   Sodium 131 (L) 135 - 145 mmol/L   Potassium 3.4 (L) 3.5 - 5.1 mmol/L   Chloride 97 (L) 101 - 111 mmol/L   CO2 26 22 - 32 mmol/L   Glucose, Bld 81 65 - 99 mg/dL   BUN 13 6 - 20 mg/dL   Creatinine, Ser 1.09 (H) 0.44 - 1.00 mg/dL   Calcium 8.8 (L) 8.9 - 10.3 mg/dL   GFR calc non Af Amer 48 (L) >60 mL/min   GFR calc Af Amer 56 (L) >60 mL/min   Anion gap 8 5 - 15  Troponin I  Result Value Ref Range   Troponin I <0.03 <0.03 ng/mL  CBC  Result Value Ref Range   WBC 8.3 3.6 - 11.0 K/uL   RBC 5.00 3.80 - 5.20 MIL/uL   Hemoglobin 15.1 12.0 - 16.0 g/dL  HCT 45.0 35.0 - 47.0 %   MCV 90.0 80.0 - 100.0 fL   MCH 30.2 26.0 - 34.0 pg   MCHC 33.5 32.0 - 36.0 g/dL   RDW 15.6 (H) 11.5 - 14.5 %   Platelets 202 150 - 440 K/uL      Assessment & Plan:   Problem List Items Addressed This Visit      Cardiovascular and Mediastinum   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 Continue current regimen. Continue to monitor.       Relevant Orders   Comprehensive metabolic panel   Microalbumin, Urine Waived   Purpura senilis (HCC)    Discussed with patient. Monitor. Reassured.         Endocrine   Hypothyroid    Rechecking levels today. Await results.       Relevant Orders   Comprehensive metabolic panel   TSH     Other   Hyponatremia    Rechecking  levels today. Await results.       Relevant Orders   Comprehensive metabolic panel   Depression    Better on her wellbutrin. Continue current regimen. Continue to monitor. Recheck in a couple weeks.       Hyperlipidemia    Under good control. Continue current regimen. Continue to monitor.       Relevant Orders   Comprehensive metabolic panel   Lipid Panel w/o Chol/HDL Ratio   Malnutrition (HCC)    Continue supplementation. Continue to monitor. Weight stable.       Protein-calorie malnutrition, severe   Relevant Orders   Comprehensive metabolic panel   Tobacco abuse    Quit date set for 03/07/16. Continue wellbutrin. Continue to monitor.       Relevant Orders   CBC with Differential/Platelet   Comprehensive metabolic panel   UA/M w/rflx Culture, Routine    Other Visit Diagnoses    Medicare annual wellness visit, subsequent    -  Primary   Prevention discussed. Information provided. Up to date. Screening labs checked today. Continue to monitor.    Encounter for screening for osteoporosis       DEXA ordered today.   Relevant Orders   DG Bone Density   Screening for breast cancer       Mammogram ordered today   Relevant Orders   MM DIGITAL SCREENING BILATERAL   Pneumonia involving left lung, unspecified part of lung       Still decreased breath sounds. 4 more days of amoxicillin. Recheck in 1 week, if not better, will treat with doxy.       Preventative Services:  Health Risk Assessment and Personalized Prevention Plan: Done today Bone Mass Measurements: Ordered today Breast Cancer Screening: Ordered today CVD Screening: Done today Cervical Cancer Screening: N/A Colon Cancer Screening:  Depression Screening: Done today Diabetes Screening: Done today Glaucoma Screening: See your Eye doctor Hepatitis B vaccine: N/A Hepatitis C screening: N/A HIV Screening: N/A Flu Vaccine: Come get one in Late September/Early October Lung cancer Screening: Ordered Obesity  Screening: Done today Pneumonia Vaccines (2): up to date STI Screening: N/A  Follow up plan: Return Next week, for Lung recheck.  LABORATORY TESTING:  - Pap smear: not applicable  IMMUNIZATIONS:   - Tdap: Tetanus vaccination status reviewed: last tetanus booster within 10 years. - Influenza: Postponed to flu season - Pneumovax: Up to date - Prevnar: Up to date  SCREENING: -Mammogram: Ordered today  - Colonoscopy: Up  to date  - Bone Density: Ordered today  -Hearing Test: Up to date  -Spirometry: To be done next visit   PATIENT COUNSELING:   Advised to take 1 mg of folate supplement per day if capable of pregnancy.   Sexuality: Discussed sexually transmitted diseases, partner selection, use of condoms, avoidance of unintended pregnancy  and contraceptive alternatives.   Advised to avoid cigarette smoking.  I discussed with the patient that most people either abstain from alcohol or drink within safe limits (<=14/week and <=4 drinks/occasion for males, <=7/weeks and <= 3 drinks/occasion for females) and that the risk for alcohol disorders and other health effects rises proportionally with the number of drinks per week and how often a drinker exceeds daily limits.  Discussed cessation/primary prevention of drug use and availability of treatment for abuse.   Diet: Encouraged to adjust caloric intake to maintain  or achieve ideal body weight, to reduce intake of dietary saturated fat and total fat, to limit sodium intake by avoiding high sodium foods and not adding table salt, and to maintain adequate dietary potassium and calcium preferably from fresh fruits, vegetables, and low-fat dairy products.    stressed the importance of regular exercise  Injury prevention: Discussed safety belts, safety helmets, smoke detector, smoking near bedding or upholstery.   Dental health: Discussed importance of regular tooth brushing, flossing, and dental visits.    NEXT PREVENTATIVE PHYSICAL  DUE IN 1 YEAR. Return Next week, for Lung recheck.

## 2016-02-22 ENCOUNTER — Telehealth: Payer: Self-pay | Admitting: Family Medicine

## 2016-02-22 ENCOUNTER — Other Ambulatory Visit: Payer: Self-pay | Admitting: Family Medicine

## 2016-02-22 DIAGNOSIS — E039 Hypothyroidism, unspecified: Secondary | ICD-10-CM

## 2016-02-22 LAB — CBC WITH DIFFERENTIAL/PLATELET
BASOS: 0 %
Basophils Absolute: 0 10*3/uL (ref 0.0–0.2)
EOS (ABSOLUTE): 0.1 10*3/uL (ref 0.0–0.4)
Eos: 1 %
Hematocrit: 42.5 % (ref 34.0–46.6)
Hemoglobin: 13.9 g/dL (ref 11.1–15.9)
Immature Grans (Abs): 0 10*3/uL (ref 0.0–0.1)
Immature Granulocytes: 0 %
LYMPHS ABS: 1 10*3/uL (ref 0.7–3.1)
Lymphs: 11 %
MCH: 30 pg (ref 26.6–33.0)
MCHC: 32.7 g/dL (ref 31.5–35.7)
MCV: 92 fL (ref 79–97)
MONOS ABS: 0.6 10*3/uL (ref 0.1–0.9)
Monocytes: 6 %
NEUTROS ABS: 8.1 10*3/uL — AB (ref 1.4–7.0)
Neutrophils: 82 %
PLATELETS: 235 10*3/uL (ref 150–379)
RBC: 4.63 x10E6/uL (ref 3.77–5.28)
RDW: 14.5 % (ref 12.3–15.4)
WBC: 9.9 10*3/uL (ref 3.4–10.8)

## 2016-02-22 LAB — LIPID PANEL W/O CHOL/HDL RATIO
Cholesterol, Total: 174 mg/dL (ref 100–199)
HDL: 72 mg/dL (ref >39)
LDL Calculated: 90 mg/dL (ref 0–99)
Triglycerides: 62 mg/dL (ref 0–149)
VLDL Cholesterol Cal: 12 mg/dL (ref 5–40)

## 2016-02-22 LAB — COMPREHENSIVE METABOLIC PANEL
A/G RATIO: 1.7 (ref 1.2–2.2)
ALT: 13 IU/L (ref 0–32)
AST: 25 IU/L (ref 0–40)
Albumin: 3.9 g/dL (ref 3.5–4.8)
Alkaline Phosphatase: 69 IU/L (ref 39–117)
BILIRUBIN TOTAL: 0.6 mg/dL (ref 0.0–1.2)
BUN/Creatinine Ratio: 12 (ref 12–28)
BUN: 10 mg/dL (ref 8–27)
CHLORIDE: 95 mmol/L — AB (ref 96–106)
CO2: 22 mmol/L (ref 18–29)
Calcium: 9.1 mg/dL (ref 8.7–10.3)
Creatinine, Ser: 0.83 mg/dL (ref 0.57–1.00)
GFR calc non Af Amer: 69 mL/min/{1.73_m2} (ref >59)
GFR, EST AFRICAN AMERICAN: 79 mL/min/{1.73_m2} (ref >59)
Globulin, Total: 2.3 g/dL (ref 1.5–4.5)
Glucose: 81 mg/dL (ref 65–99)
POTASSIUM: 4.7 mmol/L (ref 3.5–5.2)
Sodium: 134 mmol/L (ref 134–144)
TOTAL PROTEIN: 6.2 g/dL (ref 6.0–8.5)

## 2016-02-22 LAB — TSH: TSH: 10.23 u[IU]/mL — ABNORMAL HIGH (ref 0.450–4.500)

## 2016-02-22 NOTE — Telephone Encounter (Signed)
Can you please let Melody Green know that her labs came back normal (her kidney function looks great) except her thyroid which is under active. Is she taking the 54mg or the 737m? I'm going to increase it and we'll have her recheck it in 6 weeks to see how she does. I'll send her new dose through to the pharmacy once I know which one she's taking. Thanks!

## 2016-02-23 MED ORDER — LEVOTHYROXINE SODIUM 75 MCG PO TABS: 75.0000 ug | ORAL_TABLET | Freq: Every day | ORAL | 0 refills | Status: DC

## 2016-02-23 NOTE — Telephone Encounter (Signed)
Patient is alternating 74mg and 770m daily.

## 2016-02-23 NOTE — Telephone Encounter (Signed)
Will increase to 57mg daily and recheck in 6 weeks.

## 2016-02-25 ENCOUNTER — Telehealth: Payer: Self-pay | Admitting: Family Medicine

## 2016-02-25 NOTE — Telephone Encounter (Signed)
Called and spoke with pharmacy to clarify medication.

## 2016-02-25 NOTE — Telephone Encounter (Signed)
Pharmacy called and would like clarification on levothyroxine (SYNTHROID, LEVOTHROID) 75 MCG tablet.

## 2016-02-28 ENCOUNTER — Encounter: Payer: Self-pay | Admitting: Family Medicine

## 2016-02-28 ENCOUNTER — Ambulatory Visit
Admission: RE | Admit: 2016-02-28 | Discharge: 2016-02-28 | Disposition: A | Discharge status: Home / Self Care | Payer: Commercial Managed Care - HMO | Source: Ambulatory Visit | Attending: Family Medicine | Admitting: Family Medicine

## 2016-02-28 ENCOUNTER — Ambulatory Visit (INDEPENDENT_AMBULATORY_CARE_PROVIDER_SITE_OTHER): Payer: Commercial Managed Care - HMO | Admitting: Family Medicine

## 2016-02-28 ENCOUNTER — Telehealth: Payer: Self-pay | Admitting: Family Medicine

## 2016-02-28 VITALS — BP 116/80 | HR 101 | Temp 97.4°F | Wt 80.0 lb

## 2016-02-28 DIAGNOSIS — R0602 Shortness of breath: Secondary | ICD-10-CM | POA: Diagnosis not present

## 2016-02-28 DIAGNOSIS — J189 Pneumonia, unspecified organism: Secondary | ICD-10-CM

## 2016-02-28 DIAGNOSIS — R05 Cough: Secondary | ICD-10-CM | POA: Diagnosis not present

## 2016-02-28 NOTE — Telephone Encounter (Signed)
Called and let Melody Green know the results of her x-ray. Better, but will get follow up x-ray in 3 weeks at follow up to confirm resolution.   Would like to have appointment made for her at [facility]. Tiff, can we make sure to get this set? Thanks!

## 2016-02-28 NOTE — Progress Notes (Signed)
BP 116/80 (BP Location: Left Arm, Patient Position: Sitting, Cuff Size: Small)   Pulse (!) 101   Temp 97.4 F (36.3 C)   Wt 80 lb (36.3 kg)   LMP  (Approximate)   BMI 13.99 kg/m    Subjective:    Patient ID: Melody Green, female    DOB: 1941/05/21, 75 y.o.   MRN: [id number]  HPI: Melody Green is a 75 y.o. female  Chief Complaint  Patient presents with  . 1 week follow up   Shelvy states that she is feeling better. She finished her abx and is feeling more like herself. She has not been taking it easy. Has not smoked a cigarette since Sunday! No other concerns. Otherwise feeling well. No fevers or chills.   Relevant past medical, surgical, family and social history reviewed and updated as indicated. Interim medical history since our last visit reviewed. Allergies and medications reviewed and updated.  Review of Systems  Constitutional: Negative.   Respiratory: Negative.   Cardiovascular: Negative.   Psychiatric/Behavioral: Negative.     Per HPI unless specifically indicated above     Objective:    BP 116/80 (BP Location: Left Arm, Patient Position: Sitting, Cuff Size: Small)   Pulse (!) 101   Temp 97.4 F (36.3 C)   Wt 80 lb (36.3 kg)   LMP  (Approximate)   BMI 13.99 kg/m   Wt Readings from Last 3 Encounters:  02/28/16 80 lb (36.3 kg)  02/21/16 82 lb (37.2 kg)  02/15/16 82 lb (37.2 kg)    Physical Exam  Constitutional: She is oriented to person, place, and time. She appears well-developed and well-nourished. No distress.  HENT:  Head: Normocephalic and atraumatic.  Right Ear: Hearing normal.  Left Ear: Hearing normal.  Nose: Nose normal.  Eyes: Conjunctivae and lids are normal. Right eye exhibits no discharge. Left eye exhibits no discharge. No scleral icterus.  Cardiovascular: Normal rate, regular rhythm, normal heart sounds and intact distal pulses.  Exam reveals no gallop and no friction rub.   No murmur heard. Pulmonary/Chest: Effort normal and breath  sounds normal. No respiratory distress. She has no wheezes. She has no rales. She exhibits no tenderness.  Musculoskeletal: Normal range of motion.  Neurological: She is alert and oriented to person, place, and time.  Skin: Skin is warm, dry and intact. No rash noted. No erythema. No pallor.  Psychiatric: She has a normal mood and affect. Her speech is normal and behavior is normal. Judgment and thought content normal. Cognition and memory are normal.  Nursing note and vitals reviewed.   Results for orders placed or performed in visit on 02/21/16  CBC with Differential/Platelet  Result Value Ref Range   WBC 9.9 3.4 - 10.8 x10E3/uL   RBC 4.63 3.77 - 5.28 x10E6/uL   Hemoglobin 13.9 11.1 - 15.9 g/dL   Hematocrit 42.5 34.0 - 46.6 %   MCV 92 79 - 97 fL   MCH 30.0 26.6 - 33.0 pg   MCHC 32.7 31.5 - 35.7 g/dL   RDW 14.5 12.3 - 15.4 %   Platelets 235 150 - 379 x10E3/uL   Neutrophils 82 %   Lymphs 11 %   Monocytes 6 %   Eos 1 %   Basos 0 %   Neutrophils Absolute 8.1 (H) 1.4 - 7.0 x10E3/uL   Lymphocytes Absolute 1.0 0.7 - 3.1 x10E3/uL   Monocytes Absolute 0.6 0.1 - 0.9 x10E3/uL   EOS (ABSOLUTE) 0.1 0.0 - 0.4 x10E3/uL  Basophils Absolute 0.0 0.0 - 0.2 x10E3/uL   Immature Granulocytes 0 %   Immature Grans (Abs) 0.0 0.0 - 0.1 x10E3/uL  Comprehensive metabolic panel  Result Value Ref Range   Glucose 81 65 - 99 mg/dL   BUN 10 8 - 27 mg/dL   Creatinine, Ser 0.83 0.57 - 1.00 mg/dL   GFR calc non Af Amer 69 >59 mL/min/1.73   GFR calc Af Amer 79 >59 mL/min/1.73   BUN/Creatinine Ratio 12 12 - 28   Sodium 134 134 - 144 mmol/L   Potassium 4.7 3.5 - 5.2 mmol/L   Chloride 95 (L) 96 - 106 mmol/L   CO2 22 18 - 29 mmol/L   Calcium 9.1 8.7 - 10.3 mg/dL   Total Protein 6.2 6.0 - 8.5 g/dL   Albumin 3.9 3.5 - 4.8 g/dL   Globulin, Total 2.3 1.5 - 4.5 g/dL   Albumin/Globulin Ratio 1.7 1.2 - 2.2   Bilirubin Total 0.6 0.0 - 1.2 mg/dL   Alkaline Phosphatase 69 39 - 117 IU/L   AST 25 0 - 40 IU/L   ALT  13 0 - 32 IU/L  Lipid Panel w/o Chol/HDL Ratio  Result Value Ref Range   Cholesterol, Total 174 100 - 199 mg/dL   Triglycerides 62 0 - 149 mg/dL   HDL 72 >39 mg/dL   VLDL Cholesterol Cal 12 5 - 40 mg/dL   LDL Calculated 90 0 - 99 mg/dL  Microalbumin, Urine Waived  Result Value Ref Range   Microalb, Ur Waived 30 (H) 0 - 19 mg/L   Creatinine, Urine Waived 100 10 - 300 mg/dL   Microalb/Creat Ratio 30-300 (H) <30 mg/g  TSH  Result Value Ref Range   TSH 10.230 (H) 0.450 - 4.500 uIU/mL  UA/M w/rflx Culture, Routine  Result Value Ref Range   Specific Gravity, UA 1.015 1.005 - 1.030   pH, UA 6.0 5.0 - 7.5   Color, UA Yellow Yellow   Appearance Ur Clear Clear   Leukocytes, UA Negative Negative   Protein, UA Negative Negative/Trace   Glucose, UA Negative Negative   Ketones, UA Negative Negative   RBC, UA Negative Negative   Bilirubin, UA Negative Negative   Urobilinogen, Ur 1.0 0.2 - 1.0 mg/dL   Nitrite, UA Negative Negative      Assessment & Plan:   Problem List Items Addressed This Visit    None    Visit Diagnoses    Pneumonia involving left lung, unspecified part of lung    -  Primary   Lungs sound clear. Doing much better. Will get her repeat CXR to confirm resolution. Follow up in 3 weeks on depression/smoking.   Relevant Orders   DG Chest 2 View       Follow up plan: Return in about 3 weeks (around 03/20/2016).

## 2016-02-29 NOTE — Telephone Encounter (Signed)
Mammogram scheduled for 03/07/16 @ 10:50am. Patient notified.

## 2016-03-07 DIAGNOSIS — Z1231 Encounter for screening mammogram for malignant neoplasm of breast: Secondary | ICD-10-CM | POA: Diagnosis not present

## 2016-03-10 ENCOUNTER — Encounter: Payer: Self-pay | Admitting: Family Medicine

## 2016-03-10 ENCOUNTER — Encounter: Payer: Self-pay | Admitting: Emergency Medicine

## 2016-03-10 ENCOUNTER — Emergency Department: Payer: Commercial Managed Care - HMO

## 2016-03-10 ENCOUNTER — Emergency Department
Admission: EM | Admit: 2016-03-10 | Discharge: 2016-03-10 | Disposition: A | Discharge status: Home / Self Care | Payer: Commercial Managed Care - HMO | Attending: Emergency Medicine | Admitting: Emergency Medicine

## 2016-03-10 DIAGNOSIS — R079 Chest pain, unspecified: Secondary | ICD-10-CM | POA: Diagnosis not present

## 2016-03-10 DIAGNOSIS — I129 Hypertensive chronic kidney disease with stage 1 through stage 4 chronic kidney disease, or unspecified chronic kidney disease: Secondary | ICD-10-CM | POA: Diagnosis not present

## 2016-03-10 DIAGNOSIS — I712 Thoracic aortic aneurysm, without rupture, unspecified: Secondary | ICD-10-CM

## 2016-03-10 DIAGNOSIS — J449 Chronic obstructive pulmonary disease, unspecified: Secondary | ICD-10-CM | POA: Diagnosis not present

## 2016-03-10 DIAGNOSIS — E039 Hypothyroidism, unspecified: Secondary | ICD-10-CM | POA: Insufficient documentation

## 2016-03-10 DIAGNOSIS — Z79899 Other long term (current) drug therapy: Secondary | ICD-10-CM | POA: Insufficient documentation

## 2016-03-10 DIAGNOSIS — R0789 Other chest pain: Secondary | ICD-10-CM | POA: Diagnosis present

## 2016-03-10 DIAGNOSIS — R918 Other nonspecific abnormal finding of lung field: Secondary | ICD-10-CM | POA: Diagnosis not present

## 2016-03-10 DIAGNOSIS — C3492 Malignant neoplasm of unspecified part of left bronchus or lung: Secondary | ICD-10-CM

## 2016-03-10 DIAGNOSIS — F17210 Nicotine dependence, cigarettes, uncomplicated: Secondary | ICD-10-CM | POA: Insufficient documentation

## 2016-03-10 DIAGNOSIS — N184 Chronic kidney disease, stage 4 (severe): Secondary | ICD-10-CM | POA: Diagnosis not present

## 2016-03-10 DIAGNOSIS — R0602 Shortness of breath: Secondary | ICD-10-CM | POA: Diagnosis not present

## 2016-03-10 HISTORY — DX: Malignant neoplasm of unspecified part of left bronchus or lung: C34.92

## 2016-03-10 LAB — CBC
HEMATOCRIT: 45.6 % (ref 35.0–47.0)
HEMOGLOBIN: 15.6 g/dL (ref 12.0–16.0)
MCH: 31.5 pg (ref 26.0–34.0)
MCHC: 34.3 g/dL (ref 32.0–36.0)
MCV: 91.9 fL (ref 80.0–100.0)
Platelets: 209 10*3/uL (ref 150–440)
RBC: 4.96 MIL/uL (ref 3.80–5.20)
RDW: 14.7 % — AB (ref 11.5–14.5)
WBC: 9.4 10*3/uL (ref 3.6–11.0)

## 2016-03-10 LAB — BASIC METABOLIC PANEL
ANION GAP: 10 (ref 5–15)
BUN: 16 mg/dL (ref 6–20)
CHLORIDE: 104 mmol/L (ref 101–111)
CO2: 23 mmol/L (ref 22–32)
Calcium: 9 mg/dL (ref 8.9–10.3)
Creatinine, Ser: 0.93 mg/dL (ref 0.44–1.00)
GFR calc Af Amer: >60 mL/min (ref >60)
GFR calc non Af Amer: 58 mL/min — ABNORMAL LOW (ref >60)
Glucose, Bld: 92 mg/dL (ref 65–99)
POTASSIUM: 4.5 mmol/L (ref 3.5–5.1)
SODIUM: 137 mmol/L (ref 135–145)

## 2016-03-10 LAB — TROPONIN I: Troponin I: <0.03 ng/mL (ref <0.03)

## 2016-03-10 MED ORDER — IOPAMIDOL (ISOVUE-370) INJECTION 76%
75.0000 mL | Freq: Once | INTRAVENOUS | Status: AC | PRN
Start: 2016-03-10 — End: 2016-03-10
  Administered 2016-03-10: 75 mL via INTRAVENOUS

## 2016-03-10 MED ORDER — IBUPROFEN 600 MG PO TABS
600.0000 mg | ORAL_TABLET | Freq: Once | ORAL | Status: AC
  Administered 2016-03-10: 600 mg via ORAL
  Filled 2016-03-10: qty 1

## 2016-03-10 MED ORDER — ACETAMINOPHEN 325 MG PO TABS
650.0000 mg | ORAL_TABLET | Freq: Once | ORAL | Status: AC
  Administered 2016-03-10: 650 mg via ORAL
  Filled 2016-03-10: qty 2

## 2016-03-10 NOTE — ED Notes (Signed)
EDP at bedside  

## 2016-03-10 NOTE — ED Triage Notes (Signed)
States sharp pain to mid chest which moves into left arm . also having some SOB

## 2016-03-10 NOTE — ED Notes (Deleted)
Dr. Burlene Arnt notified of critical trop of 0.07

## 2016-03-10 NOTE — ED Provider Notes (Addendum)
[facility] Emergency Department Provider Note  ____________________________________________   I have reviewed the triage vital signs and the nursing notes.   HISTORY  Chief Complaint Chest Pain    HPI Melody Green is a 75 y.o. female presents today complaining of pain in her left chest wall. Patient had a mammogram yesterday and she states "they were really pulling on it" and afterwards began to hurt in her chest area where she had the mammogram done. She denies any shortness of breath nausea or vomiting pain seems to radiate in all directions but mostly is localized and very specific spot in the left chest. Is worse when she changes position or touches it. No leg swelling no recent travel. She has had no fever no chills. She denies any cough or rash. She believes that they injured her chest wall during the mammogram. The patient states that the pain started last night as been constant every minute of the day since that time.       Past Medical History:  Diagnosis Date  . 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 Chronic kidney disease   . Collagenous colitis   . COPD (chronic obstructive pulmonary disease) ([place])   . DDD (degenerative disc disease), cervical   . Depression   . Hernia of abdominal cavity   . Hyperlipidemia   . Osteoporosis   . Pneumonia   . Stroke ([place])   . Tobacco abuse   . Urine incontinence   . Vitamin B 12 deficiency     Patient Active Problem List   Diagnosis Date Noted  . Tobacco abuse 02/08/2016  . History of small bowel obstruction   . Protein-calorie malnutrition, severe 07/24/2015  . Abdominal pain 06/14/2015  . Malnutrition ([place]) 05/03/2015  . Hypothyroid 05/03/2015  . 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 Positional vertigo of both ears 05/03/2015  . Depression 12/23/2014  . Hyperlipidemia 12/23/2014  . Benign hypertension with CKD (chronic kidney disease) stage IV ([place]) 12/04/2014  . Dizziness 12/04/2014  . Hyponatremia  12/04/2014    Past Surgical History:  Procedure Laterality Date  . ABDOMINAL AORTIC ANEURYSM REPAIR    . ABDOMINAL HYSTERECTOMY    . APPENDECTOMY    . BACK SURGERY  [phone]  . CHOLECYSTECTOMY      Prior to Admission medications   Medication Sig Start Date End Date Taking? Authorizing Provider  albuterol (PROVENTIL HFA;VENTOLIN HFA) 108 (90 Base) MCG/ACT inhaler Inhale 2 puffs into the lungs every 6 (six) hours as needed for wheezing or shortness of breath. 02/08/16   Megan P Johnson, DO  amLODipine (NORVASC) 2.5 MG tablet TAKE 1 TABLET EVERY DAY 02/22/16   Guadalupe Maple, MD  atorvastatin (LIPITOR) 20 MG tablet TAKE 1 TABLET EVERY DAY 12/27/15   Megan P Johnson, DO  Bisacodyl (DULCOLAX PO) Take by mouth daily as needed.    Historical Provider, MD  buPROPion (WELLBUTRIN SR) 100 MG 12 hr tablet 1 tab qAM for 3 days, then 1 tab BID, pick a day to quit smoking in the 2nd week 02/09/16   Megan P Johnson, DO  calcium carbonate (OS-CAL) 600 MG TABS tablet Take 600 mg by mouth at bedtime.     Historical Provider, MD  clopidogrel (PLAVIX) 75 MG tablet TAKE 1 TABLET ONE TIME DAILY 02/03/16   Guadalupe Maple, MD  cyanocobalamin (,VITAMIN B-12,) 1000 MCG/ML injection Inject 1,000 mcg into the muscle every 30 (thirty) days.    Historical Provider, MD  levothyroxine (SYNTHROID, LEVOTHROID) 75 MCG  tablet Take 1 tablet (75 mcg total) by mouth daily before breakfast. 02/23/16   Megan P Johnson, DO  lidocaine (LIDODERM) 5 % Place 1 patch onto the skin daily. Remove & Discard patch within 12 hours or as directed by MD Patient not taking: Reported on 02/21/2016 02/08/16   Megan P Johnson, DO  LORazepam (ATIVAN) 1 MG tablet Take 0.5 tablets (0.5 mg total) by mouth daily as needed for anxiety. 11/10/15   Guadalupe Maple, MD  mometasone (NASONEX) 50 MCG/ACT nasal spray Place 2 sprays into the nose daily as needed (for rhinitis).     Historical Provider, MD  omeprazole (PRILOSEC) 20 MG capsule Take 1 capsule (20 mg total)  by mouth daily. Take two capsules daily for 3-4 weeks, then return to previous dose of 1 capsule daily 01/07/16   Volney American, PA-C  triamcinolone cream (KENALOG) 0.1 % Apply 1 application topically 2 (two) times daily.    Historical Provider, MD    Allergies Codeine; Morphine and related; and Ultram [tramadol]  Family History  Problem Relation Age of Onset  . Diabetes Mother   . Diabetes Father     Social History Social History  Substance Use Topics  . Smoking status: Current Every Day Smoker    Packs/day: 0.25    Types: Cigarettes  . Smokeless tobacco: Never Used  . Alcohol use No    Review of Systems Constitutional: No fever/chills Eyes: No visual changes. ENT: No sore throat. No stiff neck no neck pain Cardiovascular: See history of present illness regarding chest pain. Respiratory: Denies shortness of breath. Gastrointestinal:   no vomiting.  No diarrhea.  No constipation. Genitourinary: Negative for dysuria. Musculoskeletal: Negative lower extremity swelling Skin: Negative for rash. Neurological: Negative for severe headaches, focal weakness or numbness. 10-point ROS otherwise negative.  ____________________________________________   PHYSICAL EXAM:  VITAL SIGNS: ED Triage Vitals  Enc Vitals Group     BP 03/10/16 1028 121/67     Pulse Rate 03/10/16 1028 (!) 105     Resp 03/10/16 1028 (!) 22     Temp 03/10/16 1028 98 F (36.7 C)     Temp Source 03/10/16 1028 Oral     SpO2 03/10/16 1028 94 %     Weight 03/10/16 1024 80 lb (36.3 kg)     Height --      Head Circumference --      Peak Flow --      Pain Score 03/10/16 1020 7     Pain Loc --      Pain Edu? --      Excl. in [place]? --     Constitutional: Alert and oriented. Well appearing and in no acute distress. Eyes: Conjunctivae are normal. PERRL. EOMI. Head: Atraumatic. Nose: No congestion/rhinnorhea. Mouth/Throat: Mucous membranes are moist.  Oropharynx non-erythematous. Neck: No stridor.    Nontender with no meningismus Cardiovascular: Normal rate, regular rhythm. Grossly normal heart sounds.  Good peripheral circulation. Respiratory: Normal respiratory effort.  No retractions. Lungs CTAB. Chest: Tender to palpation left chest wall no obvious bruising, or ecchymosis noted. Patient didn't specify the exact area just to the left of the left breast which is tender there is no crepitus there is no flail chest  I touch that area, she states "ouch that's the pain right there" and pulls back. Abdominal: Soft and nontender. No distention. No guarding no rebound Back:  There is no focal tenderness or step off.  there is no midline tenderness there are no lesions  noted. there is no CVA tenderness Musculoskeletal: No lower extremity tenderness, no upper extremity tenderness. No joint effusions, no DVT signs strong distal pulses no edema Neurologic:  Normal speech and language. No gross focal neurologic deficits are appreciated.  Skin:  Skin is warm, dry and intact. No rash noted. Psychiatric: Mood and affect are normal. Speech and behavior are normal.  ____________________________________________   LABS (all labs ordered are listed, but only abnormal results are displayed)  Labs Reviewed  CBC - Abnormal; Notable for the following:       Result Value   RDW 14.7 (*)    All other components within normal limits  BASIC METABOLIC PANEL  TROPONIN I   ____________________________________________  EKG  I personally interpreted any EKGs ordered by me or triage EKG shows sinus rhythm rate 98 bpm, there is atypical ST changes anteriorly which are seen on multiple old EKGs. She'll also got a nurse approach changes, frequent PVCs noted, normal axis. ____________________________________________  ZOXWRUEAV  I reviewed any imaging ordered by me or triage that were performed during my shift and, if possible, patient and/or family made aware of any abnormal  findings. ____________________________________________   PROCEDURES  Procedure(s) performed: None  Procedures  Critical Care performed: None  ____________________________________________   INITIAL IMPRESSION / ASSESSMENT AND PLAN / ED COURSE  Pertinent labs & imaging results that were available during my care of the patient were reviewed by me and considered in my medical decision making (see chart for details).  Patient is reducible chest wall pain after her mammogram yesterday low suspicion for ACS but does have multiple risk factors we will check a troponin. Given that she has had pain since yesterday, I doubt the utility of serial cardiac enzymes. EKG does not show any significant change from prior. We'll obtain a chest x-ray of the low suspicion exists for pneumothorax or dissection. No evidence of rib fracture noted on exam. We will give her pain medication and reassess.   ----------------------------------------- 2:35 PM on 03/10/2016 -----------------------------------------  Patient feeling much better after Tylenol. Given chest x-ray and while decided obtain a CT scan which shows significant burden of what is likely a cancer. I discussed with Dr. Grayland Ormond who reviewed the CT scan. He will see the patient on Monday. He agrees with discharge at this time. We will send a second troponin is a precaution on the low suspicion this are present ACS. 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 It is nonexertional and she thought it was just from her pneumonia that she had years ago. In any event, there is no evidence of a PE. There is no evidence of a dissection. Patient will need follow-up for her ascending aortic aneurysm however, I think this is likely not become anything that needs to be addressed emergently given the  patient's new cancer diagnosis. I do not believe this is causing her to have reproducible left chest wall pain I think that is much more likely from the mammogram although incidental finding of a significant carcinoma is of course possibly also contributing.  ----------------------------------------- 3:32 PM on 03/10/2016 -----------------------------------------  Discussed with Dr. dew of vascular surgery who agrees that no further interventions emergently required for the patient's aneurysm and they will follow-up as an outpatient.  Clinical Course   ____________________________________________   FINAL CLINICAL IMPRESSION(S) / ED  DIAGNOSES  Final diagnoses:  None      This chart was dictated using voice recognition software.  Despite best efforts to proofread,  errors can occur which can change meaning.      Schuyler Amor, MD 03/10/16 [place], MD 03/10/16 [place], MD 03/10/16 [phone]

## 2016-03-10 NOTE — Discharge Instructions (Signed)
Unfortunately, as we discussed, I do see a mass on her left chest which I think is concerning for possible cancer. We never know exactly what is going on from these kind of things until we obtain a biopsy and you see a specialist. For this reason we ask that you follow-up very closely with Dr. Maryjane Hurter at his office at 10:00 in the morning on Monday for further care. Take Tylenol and Motrin for your pain, if you have increased pain or shortness of breath or you feel worse in any way please return to the emergency room.

## 2016-03-11 ENCOUNTER — Emergency Department
Admission: EM | Admit: 2016-03-11 | Discharge: 2016-03-12 | Disposition: A | Discharge status: Home / Self Care | Payer: Commercial Managed Care - HMO | Attending: Emergency Medicine | Admitting: Emergency Medicine

## 2016-03-11 ENCOUNTER — Encounter: Payer: Self-pay | Admitting: Emergency Medicine

## 2016-03-11 ENCOUNTER — Emergency Department: Payer: Commercial Managed Care - HMO

## 2016-03-11 DIAGNOSIS — Z85118 Personal history of other malignant neoplasm of bronchus and lung: Secondary | ICD-10-CM | POA: Diagnosis not present

## 2016-03-11 DIAGNOSIS — Z791 Long term (current) use of non-steroidal anti-inflammatories (NSAID): Secondary | ICD-10-CM | POA: Diagnosis not present

## 2016-03-11 DIAGNOSIS — I129 Hypertensive chronic kidney disease with stage 1 through stage 4 chronic kidney disease, or unspecified chronic kidney disease: Secondary | ICD-10-CM | POA: Insufficient documentation

## 2016-03-11 DIAGNOSIS — J449 Chronic obstructive pulmonary disease, unspecified: Secondary | ICD-10-CM | POA: Insufficient documentation

## 2016-03-11 DIAGNOSIS — F17210 Nicotine dependence, cigarettes, uncomplicated: Secondary | ICD-10-CM | POA: Insufficient documentation

## 2016-03-11 DIAGNOSIS — M62830 Muscle spasm of back: Secondary | ICD-10-CM | POA: Diagnosis not present

## 2016-03-11 DIAGNOSIS — M549 Dorsalgia, unspecified: Secondary | ICD-10-CM | POA: Diagnosis not present

## 2016-03-11 DIAGNOSIS — Z79899 Other long term (current) drug therapy: Secondary | ICD-10-CM | POA: Diagnosis not present

## 2016-03-11 DIAGNOSIS — E039 Hypothyroidism, unspecified: Secondary | ICD-10-CM | POA: Insufficient documentation

## 2016-03-11 DIAGNOSIS — N184 Chronic kidney disease, stage 4 (severe): Secondary | ICD-10-CM | POA: Insufficient documentation

## 2016-03-11 LAB — CBC WITH DIFFERENTIAL/PLATELET
BASOS ABS: 0.1 10*3/uL (ref 0–0.1)
Basophils Relative: 1 %
EOS ABS: 0.1 10*3/uL (ref 0–0.7)
EOS PCT: 1 %
HCT: 43.3 % (ref 35.0–47.0)
Hemoglobin: 15 g/dL (ref 12.0–16.0)
Lymphocytes Relative: 15 %
Lymphs Abs: 1.1 10*3/uL (ref 1.0–3.6)
MCH: 31.3 pg (ref 26.0–34.0)
MCHC: 34.5 g/dL (ref 32.0–36.0)
MCV: 90.7 fL (ref 80.0–100.0)
Monocytes Absolute: 0.8 10*3/uL (ref 0.2–0.9)
Monocytes Relative: 12 %
Neutro Abs: 5.2 10*3/uL (ref 1.4–6.5)
Neutrophils Relative %: 71 %
PLATELETS: 200 10*3/uL (ref 150–440)
RBC: 4.78 MIL/uL (ref 3.80–5.20)
RDW: 14.7 % — AB (ref 11.5–14.5)
WBC: 7.3 10*3/uL (ref 3.6–11.0)

## 2016-03-11 LAB — BASIC METABOLIC PANEL
Anion gap: 9 (ref 5–15)
BUN: 20 mg/dL (ref 6–20)
CALCIUM: 8.9 mg/dL (ref 8.9–10.3)
CO2: 24 mmol/L (ref 22–32)
CREATININE: 0.89 mg/dL (ref 0.44–1.00)
Chloride: 103 mmol/L (ref 101–111)
GFR calc Af Amer: >60 mL/min (ref >60)
Glucose, Bld: 82 mg/dL (ref 65–99)
Potassium: 3.9 mmol/L (ref 3.5–5.1)
SODIUM: 136 mmol/L (ref 135–145)

## 2016-03-11 LAB — TROPONIN I

## 2016-03-11 MED ORDER — FENTANYL CITRATE (PF) 100 MCG/2ML IJ SOLN
12.5000 ug | Freq: Once | INTRAMUSCULAR | Status: AC
  Administered 2016-03-11: 12.5 ug via INTRAVENOUS
  Filled 2016-03-11: qty 2

## 2016-03-11 MED ORDER — SODIUM CHLORIDE 0.9 % IV BOLUS (SEPSIS)
1000.0000 mL | Freq: Once | INTRAVENOUS | Status: AC
  Administered 2016-03-11: 1000 mL via INTRAVENOUS

## 2016-03-11 MED ORDER — ACETAMINOPHEN 325 MG PO TABS
650.0000 mg | ORAL_TABLET | Freq: Once | ORAL | Status: AC
  Administered 2016-03-11: 650 mg via ORAL
  Filled 2016-03-11: qty 2

## 2016-03-11 NOTE — ED Provider Notes (Signed)
[facility] Emergency Department Provider Note  ____________________________________________  Time seen: Approximately 11:39 PM  I have reviewed the triage vital signs and the nursing notes.   HISTORY  Chief Complaint Back Pain    HPI Melody Green is a 75 y.o. female brought to the ED by her son due to ongoing back pain. Patient states that it's in the upper back around the left shoulder blade. No aggravating or alleviating factors. Been there for 2 or 3 days. No recent trauma slips trips or falls. She was evaluated in the emergency department yesterday with labs serial troponins and a CT angiogram of the chest which found lung mass concerning for cancer. No evidence of PE. 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 This was incidental patient was informed and will follow-up with Dr. Lucky Cowboy.  She had previously been controlling his pain with Tylenol, but son reports that she had not been taking the Tylenol today and so the pain got worse and she comes for reevaluation of the worsened pain and symptom control.  No exertional symptoms. No shortness of breath cough fevers or chills.     Past Medical History:  Diagnosis Date  . 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 Cancer ([place]) 03/10/2016   lung  . Chronic kidney disease   . Collagenous colitis   . COPD (chronic obstructive pulmonary disease) ([place])   . DDD (degenerative disc disease), cervical   . Depression   . Hernia of abdominal cavity   . Hyperlipidemia   . Osteoporosis   . Pneumonia   . Stroke ([place])   . Tobacco abuse   . Urine incontinence   . Vitamin B 12 deficiency      Patient Active Problem List   Diagnosis Date Noted  . Tobacco abuse 02/08/2016  . History of small bowel obstruction   . Protein-calorie malnutrition, severe 07/24/2015  . Abdominal pain 06/14/2015  . Malnutrition ([place]) 05/03/2015  . Hypothyroid 05/03/2015  . Purpura senilis ([place]) 05/03/2015  . Positional vertigo of  both ears 05/03/2015  . Depression 12/23/2014  . Hyperlipidemia 12/23/2014  . Benign hypertension with CKD (chronic kidney disease) stage IV ([place]) 12/04/2014  . Dizziness 12/04/2014  . Hyponatremia 12/04/2014     Past Surgical History:  Procedure Laterality Date  . ABDOMINAL AORTIC ANEURYSM REPAIR    . ABDOMINAL HYSTERECTOMY    . APPENDECTOMY    . BACK SURGERY  [phone]  . CHOLECYSTECTOMY       Prior to Admission medications   Medication Sig Start Date End Date Taking? Authorizing Provider  albuterol (PROVENTIL HFA;VENTOLIN HFA) 108 (90 Base) MCG/ACT inhaler Inhale 2 puffs into the lungs every 6 (six) hours as needed for wheezing or shortness of breath. 02/08/16  Yes Megan P Johnson, DO  atorvastatin (LIPITOR) 20 MG tablet Take 20 mg by mouth at bedtime.   Yes Historical Provider, MD  bisacodyl (DULCOLAX) 5 MG EC tablet Take 5 mg by mouth daily as needed for moderate constipation.   Yes Historical Provider, MD  buPROPion (WELLBUTRIN SR) 100 MG 12 hr tablet 1 tab qAM for 3 days, then 1 tab BID, pick a day to quit smoking in the 2nd week Patient taking differently: Take 100 mg by mouth 2 (two) times daily.  02/09/16  Yes Megan P Johnson, DO  calcium carbonate (OS-CAL) 600 MG TABS tablet Take 600 mg by mouth at bedtime.    Yes Historical Provider, MD  clopidogrel (PLAVIX) 75 MG tablet Take  75 mg by mouth at bedtime.   Yes Historical Provider, MD  cyanocobalamin (,VITAMIN B-12,) 1000 MCG/ML injection Inject 1,000 mcg into the muscle every 30 (thirty) days.   Yes Historical Provider, MD  levothyroxine (SYNTHROID, LEVOTHROID) 75 MCG tablet Take 1 tablet (75 mcg total) by mouth daily before breakfast. Patient taking differently: Take 75 mcg by mouth at bedtime.  02/23/16  Yes Megan P Johnson, DO  LORazepam (ATIVAN) 1 MG tablet Take 0.5 tablets (0.5 mg total) by mouth daily as needed for anxiety. 11/10/15  Yes Guadalupe Maple, MD  mometasone (NASONEX) 50 MCG/ACT nasal spray Place 2 sprays into the  nose daily as needed (for rhinitis).    Yes Historical Provider, MD  omeprazole (PRILOSEC) 20 MG capsule Take 1 capsule (20 mg total) by mouth daily. Take two capsules daily for 3-4 weeks, then return to previous dose of 1 capsule daily Patient taking differently: Take 20 mg by mouth daily as needed. For acid reflux 01/07/16  Yes Volney American, PA-C  lidocaine (LIDODERM) 5 % Place 1 patch onto the skin daily. Remove & Discard patch within 12 hours or as directed by MD 02/08/16   Valerie Roys, DO  morphine 10 MG/5ML solution Take 1 mL (2 mg total) by mouth every 2 (two) hours as needed for severe pain. 03/12/16   Carrie Mew, MD     Allergies Codeine; Morphine and related; and Ultram [tramadol]   Family History  Problem Relation Age of Onset  . Diabetes Mother   . Diabetes Father     Social History Social History  Substance Use Topics  . Smoking status: Current Every Day Smoker    Packs/day: 0.25    Types: Cigarettes  . Smokeless tobacco: Never Used  . Alcohol use No    Review of Systems  Constitutional:   No fever or chills.   Cardiovascular:   No chest pain. Respiratory:   No dyspnea or cough. Gastrointestinal:   Negative for abdominal pain, vomiting and diarrhea.  Genitourinary:   Negative for dysuria or difficulty urinating. Musculoskeletal:   Positive upper back pain Neurological:   Negative for headaches or paresthesias or weakness 10-point ROS otherwise negative.  ____________________________________________   PHYSICAL EXAM:  VITAL SIGNS: ED Triage Vitals  Enc Vitals Group     BP 03/11/16 2210 (!) 163/119     Pulse Rate 03/11/16 2210 91     Resp 03/11/16 2210 16     Temp 03/11/16 2210 97.9 F (36.6 C)     Temp Source 03/11/16 2210 Oral     SpO2 03/11/16 2210 94 %     Weight 03/11/16 2211 80 lb (36.3 kg)     Height 03/11/16 2211 '5\' 3"'$  (1.6 m)     Head Circumference --      Peak Flow --      Pain Score 03/11/16 2211 10     Pain Loc --       Pain Edu? --      Excl. in [place]? --     Vital signs reviewed, nursing assessments reviewed.   Constitutional:   Alert and oriented. Well appearing and in no distress.Emaciated habitus Eyes:   No scleral icterus. No conjunctival pallor. PERRL. EOMI.  No nystagmus. ENT   Head:   Normocephalic and atraumatic.   Nose:   No congestion/rhinnorhea. No septal hematoma   Mouth/Throat:   MMM, no pharyngeal erythema. No peritonsillar mass.    Neck:   No stridor. No SubQ emphysema.  No meningismus. Hematological/Lymphatic/Immunilogical:   No cervical lymphadenopathy. Cardiovascular:   RRR. Symmetric bilateral radial and DP pulses.  No murmurs.  Respiratory:   Normal respiratory effort without tachypnea nor retractions. Breath sounds are clear and equal bilaterally. No wheezes/rales/rhonchi. Gastrointestinal:   Soft and nontender. Non distended. There is no CVA tenderness.  No rebound, rigidity, or guarding. Genitourinary:   deferred Musculoskeletal:   Nontender with normal range of motion in all extremities. No joint effusions.  No lower extremity tenderness.  No edema. There is tenderness in the paraspinous musculature on the left just medial to the spine of the scapula. This reproduces her symptoms. Tender throughout the thoracic back area. Neurologic:   Normal speech and language.  CN 2-10 normal. Motor grossly intact. No gross focal neurologic deficits are appreciated.  Skin:    Skin is warm, dry and intact. No rash noted.  No petechiae, purpura, or bullae.  ____________________________________________    LABS (pertinent positives/negatives) (all labs ordered are listed, but only abnormal results are displayed) Labs Reviewed  CBC WITH DIFFERENTIAL/PLATELET - Abnormal; Notable for the following:       Result Value   RDW 14.7 (*)    All other components within normal limits  BASIC METABOLIC PANEL  TROPONIN I   ____________________________________________   EKG Interpreted  by me Sinus rhythm rate of 96, normal axis and intervals. Poor R-wave progression in anterior precordial leads. Normal ST segments and T waves. 5 PVCs on the strip.   ____________________________________________    RADIOLOGY  Chest x-ray unremarkable  ____________________________________________   PROCEDURES Procedures  ____________________________________________   INITIAL IMPRESSION / ASSESSMENT AND PLAN / ED COURSE  Pertinent labs & imaging results that were available during my care of the patient were reviewed by me and considered in my medical decision making (see chart for details).  Patient in no distress complains of subacute pain that was evaluated thoroughly yesterday in the emergency department. She has a follow-up appointment in 2 days with oncology. Repeat labs and chest x-ray today are unremarkable. Patient given 650 of Tylenol and 12.5 g of fentanyl with resolution of her pain. I'll provide her a prescription for a very low dose of morphine solution that she can take as needed in addition to Tylenol while following up. I encouraged her to also follow up closely with Dr. due. I do not think that this is asymptomatic aneurysm, and there is no evidence of dissection or leakage on yesterday scan and no reason to reimage her today as her pain entirely appears to be reproducible musculoskeletal pain.Considering the patient's symptoms, medical history, and physical examination today, I have low suspicion for ACS, PE, TAD, pneumothorax, carditis, mediastinitis, pneumonia, CHF, or sepsis.       Clinical Course   ____________________________________________   FINAL CLINICAL IMPRESSION(S) / ED DIAGNOSES  Final diagnoses:  Muscle spasm of back       Portions of this note were generated with dragon dictation software. Dictation errors may occur despite best attempts at proofreading.    Carrie Mew, MD 03/12/16 [phone]

## 2016-03-11 NOTE — ED Triage Notes (Signed)
Pt states mid back pain x 2 days. Dx with lung ca yesterday.  bp elevated - denies htn hx.

## 2016-03-12 DIAGNOSIS — C3492 Malignant neoplasm of unspecified part of left bronchus or lung: Secondary | ICD-10-CM | POA: Insufficient documentation

## 2016-03-12 DIAGNOSIS — M549 Dorsalgia, unspecified: Secondary | ICD-10-CM | POA: Diagnosis not present

## 2016-03-12 MED ORDER — MORPHINE SULFATE 10 MG/5ML PO SOLN: 2.0000 mg | ORAL | 0 refills | Status: DC | PRN

## 2016-03-12 MED ORDER — ALBUTEROL SULFATE HFA 108 (90 BASE) MCG/ACT IN AERS: 2.0000 | INHALATION_SPRAY | RESPIRATORY_TRACT | 0 refills | Status: DC | PRN

## 2016-03-12 NOTE — ED Notes (Signed)
Patient transported to X-ray 

## 2016-03-12 NOTE — ED Notes (Signed)
AC in patient room to talk to them about their wait time.  Patients seemed pacified after their talk.

## 2016-03-12 NOTE — Progress Notes (Signed)
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  INTERVAL HISTORY: Patient is a 75 year old female who presented to the emergency room with left sided flank pain and increasing shortness of breath. She also had weight loss, but is unclear how much. Subsequent CT scan revealed a large left lung mass highly suspicious for malignancy. Currently, she is anxious but otherwise feels well. She admits to headache which is worse in the morning. She denies any fevers or illnesses. She denies any chest pain. She has no nausea, vomiting, constipation, or diarrhea. She has no urinary complaints. Patient offers no further specific complaints.  REVIEW OF SYSTEMS:   Review of Systems  Constitutional: Positive for malaise/fatigue and weight loss. Negative for fever.  Eyes: Negative.  Negative for blurred vision and double vision.  Respiratory: Positive for cough and shortness of breath. Negative for hemoptysis.   Cardiovascular: Negative.  Negative for chest pain and leg swelling.  Gastrointestinal: Negative.  Negative for abdominal pain.  Genitourinary: Negative.   Musculoskeletal: Negative.   Neurological: Positive for weakness and headaches. Negative for focal weakness.  Psychiatric/Behavioral: The patient is nervous/anxious.     As per HPI. Otherwise, a complete review of systems is negative.  PAST MEDICAL HISTORY: Past Medical History:  Diagnosis Date  . 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 Cancer ([place]) 03/10/2016   lung  . Chronic kidney disease   .  Collagenous colitis   . COPD (chronic obstructive pulmonary disease) ([place])   . DDD (degenerative disc disease), cervical   . Depression   . Hernia of abdominal cavity   . Hyperlipidemia   . Osteoporosis   . Pneumonia   . Stroke ([place])   . Tobacco abuse   . Urine incontinence   . Vitamin B 12 deficiency     PAST SURGICAL HISTORY: Past Surgical History:  Procedure Laterality Date  . ABDOMINAL AORTIC ANEURYSM REPAIR    . ABDOMINAL HYSTERECTOMY    . APPENDECTOMY    . BACK SURGERY  [phone]  . CHOLECYSTECTOMY      FAMILY HISTORY: Family History  Problem Relation Age of Onset  . Diabetes Mother   . Diabetes Father     ADVANCED DIRECTIVES (Y/N):  N  HEALTH MAINTENANCE: Social History  Substance Use Topics  . Smoking status: Current Every Day Smoker    Packs/day: 0.25    Types: Cigarettes  . Smokeless tobacco: Never Used  . Alcohol use No     Colonoscopy:  PAP:  Bone density:  Lipid panel:  Allergies  Allergen Reactions  . Codeine Nausea And Vomiting  . Morphine And Related Nausea And Vomiting  . Ultram [Tramadol] Itching    Current Outpatient Prescriptions  Medication Sig Dispense Refill  . albuterol (PROVENTIL HFA) 108 (90 Base) MCG/ACT inhaler Inhale 2 puffs into the lungs every 4 (four) hours as needed for wheezing or shortness of breath. 1 Inhaler 0  . atorvastatin (LIPITOR) 20 MG tablet Take 20 mg by mouth at bedtime.    . bisacodyl (DULCOLAX) 5 MG EC tablet Take 5 mg by mouth daily as needed for moderate constipation.    Marland Kitchen  buPROPion (WELLBUTRIN SR) 100 MG 12 hr tablet 1 tab qAM for 3 days, then 1 tab BID, pick a day to quit smoking in the 2nd week (Patient taking differently: Take 100 mg by mouth 2 (two) times daily. ) 60 tablet 1  . calcium carbonate (OS-CAL) 600 MG TABS tablet Take 600 mg by mouth at bedtime.     . clopidogrel (PLAVIX) 75 MG tablet Take 75 mg by mouth at bedtime.    . cyanocobalamin (,VITAMIN B-12,) 1000 MCG/ML injection Inject 1,000  mcg into the muscle every 30 (thirty) days.    Marland Kitchen levothyroxine (SYNTHROID, LEVOTHROID) 75 MCG tablet Take 1 tablet (75 mcg total) by mouth daily before breakfast. (Patient taking differently: Take 75 mcg by mouth at bedtime. ) 90 tablet 0  . lidocaine (LIDODERM) 5 % Place 1 patch onto the skin daily. Remove & Discard patch within 12 hours or as directed by MD 30 patch 0  . LORazepam (ATIVAN) 1 MG tablet Take 0.5 tablets (0.5 mg total) by mouth daily as needed for anxiety. 30 tablet 1  . mometasone (NASONEX) 50 MCG/ACT nasal spray Place 2 sprays into the nose daily as needed (for rhinitis).     Marland Kitchen morphine 10 MG/5ML solution Take 1 mL (2 mg total) by mouth every 2 (two) hours as needed for severe pain. 15 mL 0  . omeprazole (PRILOSEC) 20 MG capsule Take 1 capsule (20 mg total) by mouth daily. Take two capsules daily for 3-4 weeks, then return to previous dose of 1 capsule daily (Patient taking differently: Take 20 mg by mouth daily as needed. For acid reflux) 30 capsule 0   No current facility-administered medications for this visit.     OBJECTIVE: There were no vitals filed for this visit.   There is no height or weight on file to calculate BMI.    ECOG FS:1 - Symptomatic but completely ambulatory  General: Thin, no acute distress. Eyes: Pink conjunctiva, anicteric sclera. HEENT: Normocephalic, moist mucous membranes, clear oropharnyx. Lungs: Clear to auscultation bilaterally. Heart: Regular rate and rhythm. No rubs, murmurs, or gallops. Abdomen: Soft, nontender, nondistended. No organomegaly noted, normoactive bowel sounds. Musculoskeletal: No edema, cyanosis, or clubbing. Neuro: Alert, answering all questions appropriately. Cranial nerves grossly intact. Skin: No rashes or petechiae noted. Psych: Normal affect. Lymphatics: No cervical, calvicular, axillary or inguinal LAD.   LAB RESULTS:  Lab Results  Component Value Date   NA 136 03/11/2016   K 3.9 03/11/2016   CL 103 03/11/2016    CO2 24 03/11/2016   GLUCOSE 82 03/11/2016   BUN 20 03/11/2016   CREATININE 0.89 03/11/2016   CALCIUM 8.9 03/11/2016   PROT 6.2 02/21/2016   ALBUMIN 3.9 02/21/2016   AST 25 02/21/2016   ALT 13 02/21/2016   ALKPHOS 69 02/21/2016   BILITOT 0.6 02/21/2016   GFRNONAA >60 03/11/2016   GFRAA >60 03/11/2016    Lab Results  Component Value Date   WBC 7.3 03/11/2016   NEUTROABS 5.2 03/11/2016   HGB 15.0 03/11/2016   HCT 43.3 03/11/2016   MCV 90.7 03/11/2016   PLT 200 03/11/2016     STUDIES: Dg Chest 2 View  Result Date: 03/12/2016 CLINICAL DATA:  Mid back pain for 2 days.  History of lung cancer. EXAM: CHEST  2 VIEW COMPARISON:  CT scan March 10, 2016.  Chest x-ray from same date. FINDINGS: The patient has known lung cancer centered in the left perihilar region. There is significant volume loss on the left and  hyperexpansion on the right. The right lung remains clear. Previous vertebroplasties again identified. No other interval changes or acute abnormalities are noted. Anterior wedging of an upper thoracic vertebral body is unchanged since at least February 28, 2016. IMPRESSION: The patient's known left lung cancer was better assessed on yesterday's CT scan. There is persistent volume loss on the left and hyperexpansion on the right. No acute interval changes. Electronically Signed   By: Dorise Bullion III M.D   On: 03/12/2016 00:31   Dg Chest 2 View  Result Date: 03/10/2016 CLINICAL DATA:  Worsening chest pain and shortness of breath beginning yesterday. Recent pneumonia. COPD. EXAM: CHEST  2 VIEW COMPARISON:  02/28/2016 FINDINGS: Pulmonary hyperinflation again seen, consistent with COPD. Progressive left lower lobe volume loss and airspace opacity is seen. This could be due to worsening pneumonia or central endobronchial obstruction. Right lung hyperinflation noted. Right lung is clear. Heart size is within normal limits. Aortic atherosclerosis. Previous lower thoracic and upper lumbar  vertebroplasties again noted. IMPRESSION: Progressive left lower lobe volume loss and airspace opacity, which could be due to worsening pneumonia or central endobronchial obstruction. Chest CT with contrast is recommended to exclude centrally obstructing mass or lymphadenopathy COPD. Aortic atherosclerosis. Electronically Signed   By: Earle Gell M.D.   On: 03/10/2016 11:42   Dg Chest 2 View  Result Date: 02/28/2016 CLINICAL DATA:  Pneumonia, productive cough.  Shortness of breath. EXAM: CHEST  2 VIEW COMPARISON:  Radiographs of February 15, 2016. FINDINGS: Atherosclerosis of thoracic aorta is noted. Status post kyphoplasty of lower thoracic vertebral body. Right lung is clear. Left lower lobe opacity noted on prior exam is slightly improved, although residual density remains. No pneumothorax or pleural effusion is noted. Cardiomediastinal silhouette is within normal limits. IMPRESSION: Slightly decreased left lower lobe opacity is noted consistent with improving pneumonia. Significant residual density remains however, and continued radiographic follow-up is recommended to ensure resolution and rule out underlying neoplasm. Electronically Signed   By: Marijo Conception, M.D.   On: 02/28/2016 12:19   Dg Chest 2 View  Result Date: 02/15/2016 CLINICAL DATA:  Pneumonia, bronchitis, left side chest pain EXAM: CHEST  2 VIEW COMPARISON:  09/08/2014 FINDINGS: There is infiltrate/ pneumonia in left lower lobe and left perihilar. Follow-up to resolution is recommended. Prior vertebroplasty noted lower thoracic spine. Mild elevation of the right hemidiaphragm. Mild hyperinflation. No pulmonary edema. Atherosclerotic calcifications of thoracic aorta. IMPRESSION: Infiltrate/ pneumonia in left lower lobe and left perihilar. Mild left hilar prominence suspicious for adenopathy. Followup PA and lateral chest X-ray is recommended in 3-4 weeks following trial of antibiotic therapy to ensure resolution and exclude underlying  malignancy. Electronically Signed   By: Lahoma Crocker M.D.   On: 02/15/2016 12:17   Ct Angio Chest Pe W And/or Wo Contrast  Result Date: 03/10/2016 CLINICAL DATA:  Left-sided chest pain and shortness of breath beginning last night. Recent pneumonia. COPD. EXAM: CT ANGIOGRAPHY CHEST WITH CONTRAST TECHNIQUE: Multidetector CT imaging of the chest was performed using the standard protocol during bolus administration of intravenous contrast. Multiplanar CT image reconstructions and MIPs were obtained to evaluate the vascular anatomy. CONTRAST:  75 mL Isovue 370 COMPARISON:  Chest radiograph 03/10/2016 FINDINGS: Cardiovascular: Satisfactory opacification of pulmonary arteries is seen, and there is no evidence of acute pulmonary embolism. Normal heart size. Ascending thoracic aortic aneurysm is seen measuring 4.0 cm, however there is no evidence of thoracic aortic dissection. Aortic atherosclerosis. Mediastinum/Lymph Nodes: Mediastinal lymphadenopathy is seen in the precarinal  and subcarinal regions, with largest area in subcarinal region measuring 3.4 cm in short axis on image 42/8. Mild mediastinal lymphadenopathy also seen within the prevascular space. Mild right hilar lymphadenopathy is also seen, with largest lymph node measuring 1.3 cm on image 55/8. Lungs/Pleura: Diffuse left lung volume loss is demonstrated. A large central masslike opacity is seen involving the left upper and lower lobes and left lung hilum, which narrows the left mainstem bronchus and obstructs the central upper and lower lobe bronchi. This measures approximately 9.6 x 5.0 cm on image 41/11. Some postobstructive atelectasis or pneumonitis is seen in the peripheral left lower lobe. A small left pleural effusion is also seen, suspicious for malignant effusion. Compensatory hyperinflation of the right lung is seen. No evidence of right lung mass or infiltrate. Upper abdomen: No acute findings. Musculoskeletal: No chest wall mass or suspicious bone  lesions identified. Old T11 vertebroplasty demonstrated. Review of the MIP images confirms the above findings. IMPRESSION: No evidence of acute pulmonary embolism. 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 No evidence of thoracic aortic dissection. Left lung volume loss with large central masslike opacity involving left upper and lower lobes and left hilum, highly suspicious for bronchogenic carcinoma. Consider bronchoscopy for further evaluation. Mediastinal and right hilar lymphadenopathy,, highly suspicious for metastatic disease. Small left pleural effusion.  Consider thoracentesis for cytology. Electronically Signed   By: Earle Gell M.D.   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  PLAN:    1.  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 This is highly suspicious for underlying malignancy. Referral was given to pulmonary for further evaluation and consideration of biopsy. Patient has been instructed to discontinue her Plavix in preparation for biopsy. Will also get an MRI of her brain and PET scan for staging purposes. Return to clinic on March 27, 2016 for further evaluation, discussion of her diagnostic results, and treatment planning if desired. 2. Headaches: MRI as above. 3. Pain: Patient states Tylenol is adequate, continue as needed. 4. Weight loss: Likely multifactorial including possible underlying malignancy. Consider Megace in the future.  Approximately 45 minutes was spent in discussion of which greater than 50% was consultation.  Patient expressed understanding and was in agreement with this plan. She also understands that She can call clinic at any time with any questions, concerns, or complaints.   Cancer of hilus of left lung [facility])   Staging form: Lung, AJCC 7th Edition   - Clinical stage from 03/12/2016: Stage IIIB (T3, N3, M0) - Signed by Lloyd Huger, MD on  03/12/2016  Lloyd Huger, MD   03/12/2016 11:15 PM

## 2016-03-13 ENCOUNTER — Inpatient Hospital Stay: Payer: Commercial Managed Care - HMO

## 2016-03-13 ENCOUNTER — Encounter: Payer: Self-pay | Admitting: Oncology

## 2016-03-13 ENCOUNTER — Inpatient Hospital Stay: Payer: Commercial Managed Care - HMO | Attending: Oncology | Admitting: Oncology

## 2016-03-13 VITALS — BP 118/83 | HR 107 | Temp 96.8°F | Resp 18 | Wt 78.6 lb

## 2016-03-13 DIAGNOSIS — E538 Deficiency of other specified B group vitamins: Secondary | ICD-10-CM | POA: Diagnosis not present

## 2016-03-13 DIAGNOSIS — R911 Solitary pulmonary nodule: Secondary | ICD-10-CM | POA: Diagnosis not present

## 2016-03-13 DIAGNOSIS — R109 Unspecified abdominal pain: Secondary | ICD-10-CM

## 2016-03-13 DIAGNOSIS — M818 Other osteoporosis without current pathological fracture: Secondary | ICD-10-CM

## 2016-03-13 DIAGNOSIS — Z8701 Personal history of pneumonia (recurrent): Secondary | ICD-10-CM

## 2016-03-13 DIAGNOSIS — C7801 Secondary malignant neoplasm of right lung: Secondary | ICD-10-CM | POA: Diagnosis not present

## 2016-03-13 DIAGNOSIS — R5383 Other fatigue: Secondary | ICD-10-CM

## 2016-03-13 DIAGNOSIS — R0602 Shortness of breath: Secondary | ICD-10-CM

## 2016-03-13 DIAGNOSIS — R531 Weakness: Secondary | ICD-10-CM

## 2016-03-13 DIAGNOSIS — F17210 Nicotine dependence, cigarettes, uncomplicated: Secondary | ICD-10-CM

## 2016-03-13 DIAGNOSIS — M5030 Other cervical disc degeneration, unspecified cervical region: Secondary | ICD-10-CM | POA: Diagnosis not present

## 2016-03-13 DIAGNOSIS — R05 Cough: Secondary | ICD-10-CM

## 2016-03-13 DIAGNOSIS — F419 Anxiety disorder, unspecified: Secondary | ICD-10-CM | POA: Diagnosis not present

## 2016-03-13 DIAGNOSIS — R32 Unspecified urinary incontinence: Secondary | ICD-10-CM | POA: Diagnosis not present

## 2016-03-13 DIAGNOSIS — C3402 Malignant neoplasm of left main bronchus: Secondary | ICD-10-CM | POA: Diagnosis not present

## 2016-03-13 DIAGNOSIS — J90 Pleural effusion, not elsewhere classified: Secondary | ICD-10-CM | POA: Insufficient documentation

## 2016-03-13 DIAGNOSIS — E785 Hyperlipidemia, unspecified: Secondary | ICD-10-CM

## 2016-03-13 DIAGNOSIS — K458 Other specified abdominal hernia without obstruction or gangrene: Secondary | ICD-10-CM | POA: Diagnosis not present

## 2016-03-13 DIAGNOSIS — I2510 Atherosclerotic heart disease of native coronary artery without angina pectoris: Secondary | ICD-10-CM | POA: Diagnosis not present

## 2016-03-13 DIAGNOSIS — R51 Headache: Secondary | ICD-10-CM | POA: Diagnosis not present

## 2016-03-13 DIAGNOSIS — N189 Chronic kidney disease, unspecified: Secondary | ICD-10-CM | POA: Diagnosis not present

## 2016-03-13 DIAGNOSIS — C7951 Secondary malignant neoplasm of bone: Secondary | ICD-10-CM | POA: Insufficient documentation

## 2016-03-13 DIAGNOSIS — R918 Other nonspecific abnormal finding of lung field: Secondary | ICD-10-CM

## 2016-03-13 DIAGNOSIS — I714 Abdominal aortic aneurysm, without rupture: Secondary | ICD-10-CM

## 2016-03-13 DIAGNOSIS — Z8673 Personal history of transient ischemic attack (TIA), and cerebral infarction without residual deficits: Secondary | ICD-10-CM

## 2016-03-13 DIAGNOSIS — J449 Chronic obstructive pulmonary disease, unspecified: Secondary | ICD-10-CM | POA: Diagnosis not present

## 2016-03-13 DIAGNOSIS — M899 Disorder of bone, unspecified: Secondary | ICD-10-CM | POA: Insufficient documentation

## 2016-03-13 DIAGNOSIS — R634 Abnormal weight loss: Secondary | ICD-10-CM

## 2016-03-13 DIAGNOSIS — Z79899 Other long term (current) drug therapy: Secondary | ICD-10-CM

## 2016-03-13 DIAGNOSIS — Z01812 Encounter for preprocedural laboratory examination: Secondary | ICD-10-CM

## 2016-03-13 LAB — PROTIME-INR
INR: 0.92
PROTHROMBIN TIME: 12.4 s (ref 11.4–15.2)

## 2016-03-13 LAB — APTT: APTT: 27 s (ref 24–36)

## 2016-03-13 NOTE — Progress Notes (Signed)
New referral from recent ED visit for lung mass. States has shortness of breath at times with cough.

## 2016-03-15 ENCOUNTER — Telehealth: Payer: Self-pay | Admitting: *Deleted

## 2016-03-15 ENCOUNTER — Ambulatory Visit
Admission: RE | Admit: 2016-03-15 | Discharge: 2016-03-15 | Disposition: A | Discharge status: Home / Self Care | Payer: Commercial Managed Care - HMO | Source: Ambulatory Visit | Attending: Oncology | Admitting: Oncology

## 2016-03-15 DIAGNOSIS — C3490 Malignant neoplasm of unspecified part of unspecified bronchus or lung: Secondary | ICD-10-CM | POA: Diagnosis not present

## 2016-03-15 DIAGNOSIS — R51 Headache: Secondary | ICD-10-CM | POA: Diagnosis not present

## 2016-03-15 DIAGNOSIS — R918 Other nonspecific abnormal finding of lung field: Secondary | ICD-10-CM | POA: Diagnosis not present

## 2016-03-15 DIAGNOSIS — I679 Cerebrovascular disease, unspecified: Secondary | ICD-10-CM | POA: Diagnosis not present

## 2016-03-15 MED ORDER — GADOBENATE DIMEGLUMINE 529 MG/ML IV SOLN
10.0000 mL | Freq: Once | INTRAVENOUS | Status: AC | PRN
  Administered 2016-03-15: 7 mL via INTRAVENOUS

## 2016-03-15 MED ORDER — HYDROCODONE-ACETAMINOPHEN 5-325 MG PO TABS
1.0000 | ORAL_TABLET | Freq: Four times a day (QID) | ORAL | 0 refills | Status: DC | PRN
Start: 2016-03-15 — End: 2016-03-28

## 2016-03-15 NOTE — Telephone Encounter (Signed)
For new rx she does not want to take MS. Per Dr Rock Nephew 5/325 mg q 6 h prn Patient informed

## 2016-03-16 ENCOUNTER — Ambulatory Visit (INDEPENDENT_AMBULATORY_CARE_PROVIDER_SITE_OTHER): Payer: Commercial Managed Care - HMO | Admitting: Pulmonary Disease

## 2016-03-16 ENCOUNTER — Encounter: Payer: Self-pay | Admitting: Pulmonary Disease

## 2016-03-16 VITALS — BP 136/76 | HR 100 | Ht 63.0 in | Wt 78.6 lb

## 2016-03-16 DIAGNOSIS — Z87891 Personal history of nicotine dependence: Secondary | ICD-10-CM | POA: Diagnosis not present

## 2016-03-16 DIAGNOSIS — R64 Cachexia: Secondary | ICD-10-CM | POA: Diagnosis not present

## 2016-03-16 DIAGNOSIS — R918 Other nonspecific abnormal finding of lung field: Secondary | ICD-10-CM

## 2016-03-16 DIAGNOSIS — R0600 Dyspnea, unspecified: Secondary | ICD-10-CM | POA: Diagnosis not present

## 2016-03-16 NOTE — Patient Instructions (Addendum)
We will plan for bronchoscopy to obtain a biopsy of this lung mass. We are trying to get this scheduled for tomorrow (03/17/16) at 1:00 PM

## 2016-03-16 NOTE — Progress Notes (Signed)
PULMONARY CONSULT NOTE  Requesting MD/Service: Grayland Ormond, MD Date of initial consultation: 03/16/16 Reason for consultation: Lung mass  PT PROFILE: 5 F Green referred for evaluation of very large LLL mass  HPI:  Melody Green who has experienced L sided chest discomfort for several months and has been treated for PNA in August of this year when a CXR revealed LLL opacity. She presented to the ED on 03/10/16 with severe L sided pain and CXR, CT chest were performed - these are discussed below. She saw Dr Grayland Ormond on 03/13/16 and is referred to me for diagnostic evaluation. MRI of brain has been performed and was negative. PET scan is ordered. She has been a Green of 1/2 to 1 PPD all of her adult life until 2 weeks ago. She notes 40 pound weight loss in past year. She notes a general decline in functional status since March of this year. She had one episode of hemoptysis 3 or 4 weeks ago. She denies LE edema and calf tenderness. She has minimal cough presently.   Past Medical History:  Diagnosis Date  . 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 Cancer ([place]) 03/10/2016   lung  . Chronic kidney disease   . Collagenous colitis   . COPD (chronic obstructive pulmonary disease) ([place])   . DDD (degenerative disc disease), cervical   . Depression   . Hernia of abdominal cavity   . Hyperlipidemia   . Osteoporosis   . Pneumonia   . Stroke ([place])   . Tobacco abuse   . Urine incontinence   . Vitamin B 12 deficiency     Past Surgical History:  Procedure Laterality Date  . ABDOMINAL AORTIC ANEURYSM REPAIR    . ABDOMINAL HYSTERECTOMY    . APPENDECTOMY    . BACK SURGERY  [phone]  . CHOLECYSTECTOMY      MEDICATIONS: I have reviewed all medications and confirmed regimen as documented  Social History   Social History  . Marital status: Widowed    Spouse name: N/A  . Number of children: N/A  . Years of education: N/A   Occupational History  . Not on file.   Social History Main Topics  . Smoking  status: Former Green    Packs/day: 0.25    Types: Cigarettes  . Smokeless tobacco: Never Used     Comment: quit smoking 03/04/16  . Alcohol use No  . Drug use: No  . Sexual activity: No   Other Topics Concern  . Not on file   Social History Narrative  . No narrative on file    Family History  Problem Relation Age of Onset  . Diabetes Mother   . Diabetes Father     ROS: No fever, myalgias/arthralgias No new focal weakness or sensory deficits No otalgia, hearing loss, visual changes, nasal and sinus symptoms, mouth and throat problems No neck pain or adenopathy No abdominal pain, N/V/D, diarrhea, change in bowel pattern No dysuria, change in urinary pattern   Vitals:   03/16/16 1136  BP: 136/76  Pulse: 100  SpO2: 94%  Weight: 78 lb 9.6 oz (35.7 kg)  Height: '5\' 3"'$  (1.6 m)     EXAM:  Gen: Cachectic, No overt respiratory distress HEENT: NCAT, sclera white, oropharynx normal Neck: Supple without LAN, thyromegaly, JVD Lungs: breath sounds: Full on R, diminished on L, percussion: dull in L base, No wheezes Cardiovascular: Reg, no murmurs noted Abdomen: Soft, nontender, normal BS Ext: without clubbing, cyanosis, edema Neuro: CNs grossly intact, motor and  sensory intact Skin: Limited exam, no lesions noted  DATA:   BMP Latest Ref Rng & Units 03/11/2016 03/10/2016 02/21/2016  Glucose 65 - 99 mg/dL 82 92 81  BUN 6 - 20 mg/dL '20 16 10  '$ Creatinine 0.44 - 1.00 mg/dL 0.89 0.93 0.83  BUN/Creat Ratio 12 - 28 - - 12  Sodium 135 - 145 mmol/L 136 137 134  Potassium 3.5 - 5.1 mmol/L 3.9 4.5 4.7  Chloride 101 - 111 mmol/L 103 104 95(L)  CO2 22 - 32 mmol/L '24 23 22  '$ Calcium 8.9 - 10.3 mg/dL 8.9 9.0 9.1    CBC Latest Ref Rng & Units 03/11/2016 03/10/2016 02/21/2016  WBC 3.6 - 11.0 K/uL 7.3 9.4 9.9  Hemoglobin 12.0 - 16.0 g/dL 15.0 15.6 -  Hematocrit 35.0 - 47.0 % 43.3 45.6 42.5  Platelets 150 - 440 K/uL 200 209 235    CXR (03/12/16):  LLL mas with severe volume loss on L and R>L  mediastinal shift CT chest (03/10/16): Left lung volume loss with large central masslike opacity involving left upper and lower lobes and left hilum, highly suspicious for bronchogenic carcinoma.  Mediastinal and right hilar lymphadenopathy   IMPRESSION:     ICD-9-CM ICD-10-CM   1. Lung mass 786.6 R91.8   2. Former Green V15.82 Z87.891   3. Cachexia ([place]) 799.4 R64   4. 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 This is not likely resectable disease and she would be an extremely poor candidate for thoracotomy even if it were. After our discussion, she understands that the therapeutic options will likely include chemotherapy and/or XRT  PLAN:  Bronchoscopy scheduled for 03/17/16 @ 1:00 PM. I expect to see endobronchial disease and will obtain endobronchial biopsies. I have discussed with her in detail the reason for the bronchoscopy, its risks and alternatives. All questions were answered and she agrees to proceed.   I have not scheduled follow up with her but we will contact her with biopsy results and work in concert with Dr Grayland Ormond as necessary.    Merton Border, MD PCCM service Mobile [phone] Pager [phone] 03/16/2016

## 2016-03-17 ENCOUNTER — Encounter
Admission: RE | Disposition: A | Discharge status: Home / Self Care | Payer: Self-pay | Source: Ambulatory Visit | Attending: Pulmonary Disease

## 2016-03-17 ENCOUNTER — Ambulatory Visit
Admission: RE | Admit: 2016-03-17 | Discharge: 2016-03-17 | Disposition: A | Discharge status: Home / Self Care | Payer: Commercial Managed Care - HMO | Source: Ambulatory Visit | Attending: Pulmonary Disease | Admitting: Pulmonary Disease

## 2016-03-17 DIAGNOSIS — R918 Other nonspecific abnormal finding of lung field: Secondary | ICD-10-CM | POA: Diagnosis not present

## 2016-03-17 DIAGNOSIS — C3482 Malignant neoplasm of overlapping sites of left bronchus and lung: Secondary | ICD-10-CM | POA: Diagnosis not present

## 2016-03-17 DIAGNOSIS — J449 Chronic obstructive pulmonary disease, unspecified: Secondary | ICD-10-CM | POA: Insufficient documentation

## 2016-03-17 DIAGNOSIS — C3432 Malignant neoplasm of lower lobe, left bronchus or lung: Secondary | ICD-10-CM | POA: Insufficient documentation

## 2016-03-17 DIAGNOSIS — N189 Chronic kidney disease, unspecified: Secondary | ICD-10-CM | POA: Diagnosis not present

## 2016-03-17 DIAGNOSIS — Z8673 Personal history of transient ischemic attack (TIA), and cerebral infarction without residual deficits: Secondary | ICD-10-CM | POA: Diagnosis not present

## 2016-03-17 DIAGNOSIS — Z87891 Personal history of nicotine dependence: Secondary | ICD-10-CM | POA: Insufficient documentation

## 2016-03-17 HISTORY — PX: FLEXIBLE BRONCHOSCOPY: SHX5094

## 2016-03-17 SURGERY — BRONCHOSCOPY, FLEXIBLE
Anesthesia: Moderate Sedation

## 2016-03-17 MED ORDER — FENTANYL CITRATE (PF) 100 MCG/2ML IJ SOLN
INTRAMUSCULAR | Status: AC | PRN
  Administered 2016-03-17: 25 ug via INTRAVENOUS
  Administered 2016-03-17: 12.5 ug via INTRAVENOUS

## 2016-03-17 MED ORDER — BUTAMBEN-TETRACAINE-BENZOCAINE 2-2-14 % EX AERO
1.0000 | INHALATION_SPRAY | Freq: Once | CUTANEOUS | Status: DC
  Filled 2016-03-17: qty 20

## 2016-03-17 MED ORDER — MIDAZOLAM HCL 2 MG/2ML IJ SOLN
INTRAMUSCULAR | Status: AC | PRN
  Administered 2016-03-17: 0.5 mg via INTRAVENOUS
  Administered 2016-03-17: 1 mg via INTRAVENOUS

## 2016-03-17 MED ORDER — PHENYLEPHRINE HCL 0.25 % NA SOLN
1.0000 | Freq: Four times a day (QID) | NASAL | Status: DC | PRN
  Filled 2016-03-17 (×3): qty 15

## 2016-03-17 MED ORDER — FENTANYL CITRATE (PF) 100 MCG/2ML IJ SOLN
INTRAMUSCULAR | Status: AC
  Filled 2016-03-17: qty 4

## 2016-03-17 MED ORDER — LIDOCAINE HCL 2 % EX GEL: 1.0000 "application " | Freq: Once | CUTANEOUS | Status: DC

## 2016-03-17 MED ORDER — MIDAZOLAM HCL 5 MG/5ML IJ SOLN
INTRAMUSCULAR | Status: AC
  Filled 2016-03-17: qty 10

## 2016-03-17 NOTE — Procedures (Signed)
Indication:   Lung mass  Premedication:   Fentanyl 37.5 mcg Midaz 1.5 mg  Anesthesia: Topical to nose and throat 30 cc of 1% lidocaine used during the course of procedure  Procedure: After adequate sedation and anesthesia, the bronchoscope was introduced via the L naris and advanced into the posterior pharynx. Further anesthesia was obtained with 1% lidocaine and the scope was advanced into the trachea. Complete airway anesthesia was achieved with 1% lidocaine and a thorough airway examination was performed. This revealed the following findings:  Findings:  Upper airway - normal anatomy, cords moved symmetrically  Tracheobronchial anatomy - normal arangement Bronchial mucosa - airways diffusely studded with tumor in L main, R mainstem, LUL, RUL, RML bronchi Other - LLL and RML obstructed with tumor. Airways bled very easily with minima scope trauma  Specimens:   BAL from LLL for cytology Cytology brushings from LLL for cytology Endobronchial biopsy from LLL for surgical path  Complications: Moderate airway bleeding which resolved prior to removal of scope  Post procedure evaluation:  The patient tolerated the procedure well with no major complications CXR - None   Merton Border, MD PCCM service Mobile [phone] Pager [phone]  03/17/2016

## 2016-03-17 NOTE — Sedation Documentation (Signed)
Patient tolerated procedure well. Received total of 37.22mg fentanyl and 1.'5mg'$  versed throughout procedure. Will transfer to recovery.

## 2016-03-17 NOTE — Discharge Instructions (Signed)
Flexible Bronchoscopy, Care After Refer to this sheet in the next few weeks. These instructions provide you with information on caring for yourself after your procedure. Your health care provider may also give you more specific instructions. Your treatment has been planned according to current medical practices, but problems sometimes occur. Call your health care provider if you have any problems or questions after your procedure.  WHAT TO EXPECT AFTER THE PROCEDURE It is normal to have the following symptoms for 24-48 hours after the procedure:   Increased cough.  Low-grade fever.  Sore throat or hoarse voice.  Small streaks of blood in your thick spit (sputum) if tissue samples were taken (biopsy). HOME CARE INSTRUCTIONS   Do not eat or drink anything for 2 hours after your procedure. Your nose and throat were numbed by medicine. If you try to eat or drink before the medicine wears off, food or drink could go into your lungs or you could burn yourself. After the numbness is gone and your cough and gag reflexes have returned, you may eat soft food and drink liquids slowly.   The day after the procedure, you can go back to your normal diet.   You may resume normal activities.   Keep all follow-up visits as directed by your health care provider. It is important to keep all your appointments, especially if tissue samples were taken for testing (biopsy). SEEK IMMEDIATE MEDICAL CARE IF:   You have increasing shortness of breath.   You become light-headed or faint.   You have chest pain.   You have any new concerning symptoms.  You cough up more than a small amount of blood.  The amount of blood you cough up increases. MAKE SURE YOU:  Understand these instructions.  Will watch your condition.  Will get help right away if you are not doing well or get worse.   This information is not intended to replace advice given to you by your health care provider. Make sure you discuss  any questions you have with your health care provider.   Document Released: 01/06/2005 Document Revised: 07/10/2014 Document Reviewed: 02/21/2013 Elsevier Interactive Patient Education [employer].

## 2016-03-17 NOTE — H&P (Signed)
See Consult Note from 03/16/16  Merton Border, MD PCCM service Mobile [phone] Pager [phone] 03/17/2016

## 2016-03-20 ENCOUNTER — Encounter: Payer: Self-pay | Admitting: Pulmonary Disease

## 2016-03-20 ENCOUNTER — Ambulatory Visit: Payer: Commercial Managed Care - HMO | Admitting: Family Medicine

## 2016-03-21 ENCOUNTER — Other Ambulatory Visit: Payer: Self-pay | Admitting: *Deleted

## 2016-03-21 ENCOUNTER — Ambulatory Visit
Admission: RE | Admit: 2016-03-21 | Discharge: 2016-03-21 | Disposition: A | Discharge status: Home / Self Care | Payer: Commercial Managed Care - HMO | Source: Ambulatory Visit | Attending: Oncology | Admitting: Oncology

## 2016-03-21 DIAGNOSIS — J90 Pleural effusion, not elsewhere classified: Secondary | ICD-10-CM | POA: Diagnosis not present

## 2016-03-21 DIAGNOSIS — I700 Atherosclerosis of aorta: Secondary | ICD-10-CM | POA: Insufficient documentation

## 2016-03-21 DIAGNOSIS — C7801 Secondary malignant neoplasm of right lung: Secondary | ICD-10-CM | POA: Diagnosis not present

## 2016-03-21 DIAGNOSIS — I2510 Atherosclerotic heart disease of native coronary artery without angina pectoris: Secondary | ICD-10-CM | POA: Insufficient documentation

## 2016-03-21 DIAGNOSIS — R918 Other nonspecific abnormal finding of lung field: Secondary | ICD-10-CM | POA: Diagnosis not present

## 2016-03-21 DIAGNOSIS — I313 Pericardial effusion (noninflammatory): Secondary | ICD-10-CM | POA: Insufficient documentation

## 2016-03-21 DIAGNOSIS — C3492 Malignant neoplasm of unspecified part of left bronchus or lung: Secondary | ICD-10-CM | POA: Diagnosis not present

## 2016-03-21 DIAGNOSIS — C7951 Secondary malignant neoplasm of bone: Secondary | ICD-10-CM | POA: Insufficient documentation

## 2016-03-21 LAB — CYTOLOGY - NON PAP

## 2016-03-21 LAB — GLUCOSE, CAPILLARY: GLUCOSE-CAPILLARY: 81 mg/dL (ref 65–99)

## 2016-03-21 LAB — SURGICAL PATHOLOGY

## 2016-03-21 MED ORDER — FLUDEOXYGLUCOSE F - 18 (FDG) INJECTION
12.3100 | Freq: Once | INTRAVENOUS | Status: AC
  Administered 2016-03-21: 12.31 via INTRAVENOUS

## 2016-03-21 NOTE — Patient Outreach (Signed)
Melody Green [facility]) Care Management  03/21/2016  Melody Green [birth year] [id number]   Subjective: Telephone call to patient's home number, spoke with patient, and HIPAA verified.  Discussed [facility] Care Management services and patient in agreement to complete telephone screen. 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   States she is trying to stay positive, thinks about this often, is not going to give up, and does not want to leave her children.  Depression screening tool completed, patient scored 6 on PHQ2 and 7 on PHQ9.   Patient in agreement to Melody Green referral for depression screening follow up, Melody Green ( community resources and assist with completion), and financial resources related to new cancer diagnosis (possible financial hardship).  Patient also in agreement for [name] Care Management to report depression screening results to primary MD as needed.  Patient states she is waiting for direction from MD regarding status of Plavix.  States she was told to hold Plavix for the lung biopsy.  Currently receives medicines through [name] mail order pharmacy.  Patient states she has a history of COPD, chronic kidney disease, hyperlipidemia, and is a former smoker.   Patient states she does not have any disease management, disease monitoring, transportation, or pharmacy needs at this time.    States wants to be as independent as possible and do as much as she can for herself right now.   Patient will continue to receive [name] services.    Objective: Per chart review: Patient had bronchoscopy on 03/17/16.   Patient had ED visit on 03/07/16 for chest and back pain.   Patient also had ED visit on 02/15/16 for chest pain.    Assessment: Received EMMI [facility] referral on 03/03/16.   Referral reason: Chronic Kidney and  Hypertension.   Telephone screen completed and patient referred to [name] for depression screening follow up, [name] ( community resources and assist with completion), and financial resources related to new cancer diagnosis (possible financial hardship).   No Telephonic RNCM needs at this time.   Plan: RNCM refer patient to [name], for depression screening follow up, [place] ( community resources and assist with completion), and financial resources related to new cancer diagnosis (possible financial hardship).  [name] H. Annia Friendly, BSN, [place] [facility] Telephonic CM Phone: [phone] Fax: [phone]

## 2016-03-22 ENCOUNTER — Encounter: Payer: Self-pay | Admitting: *Deleted

## 2016-03-22 DIAGNOSIS — I639 Cerebral infarction, unspecified: Secondary | ICD-10-CM | POA: Insufficient documentation

## 2016-03-24 IMAGING — CR DG ABDOMEN 2V
2 series · 2 of 2 positions shown · non-contrast
Comparison: CT of the abdomen and pelvis 07/23/2015

CLINICAL DATA: Worsening abdominal pain.

EXAM:
ABDOMEN - 2 VIEW

[abdomen erect (1 of 2)]
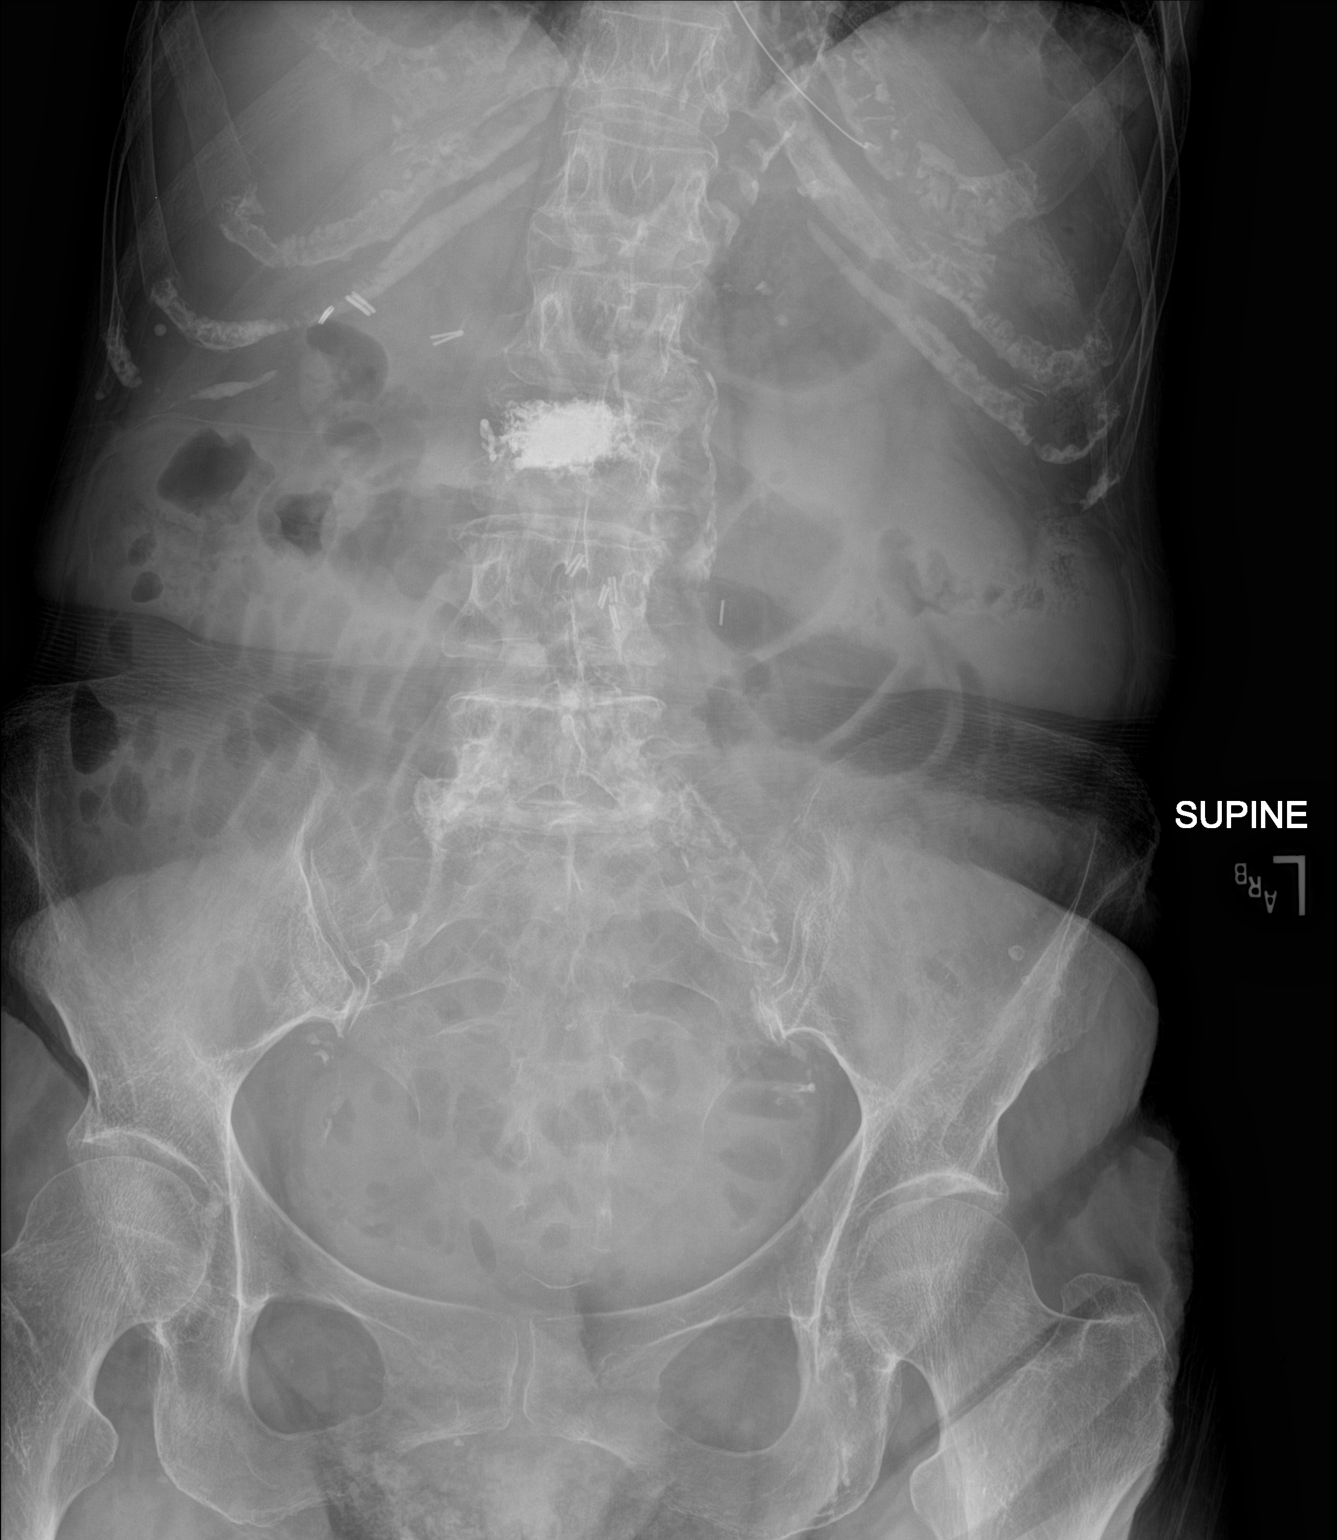

[abdomen erect (2 of 2)]
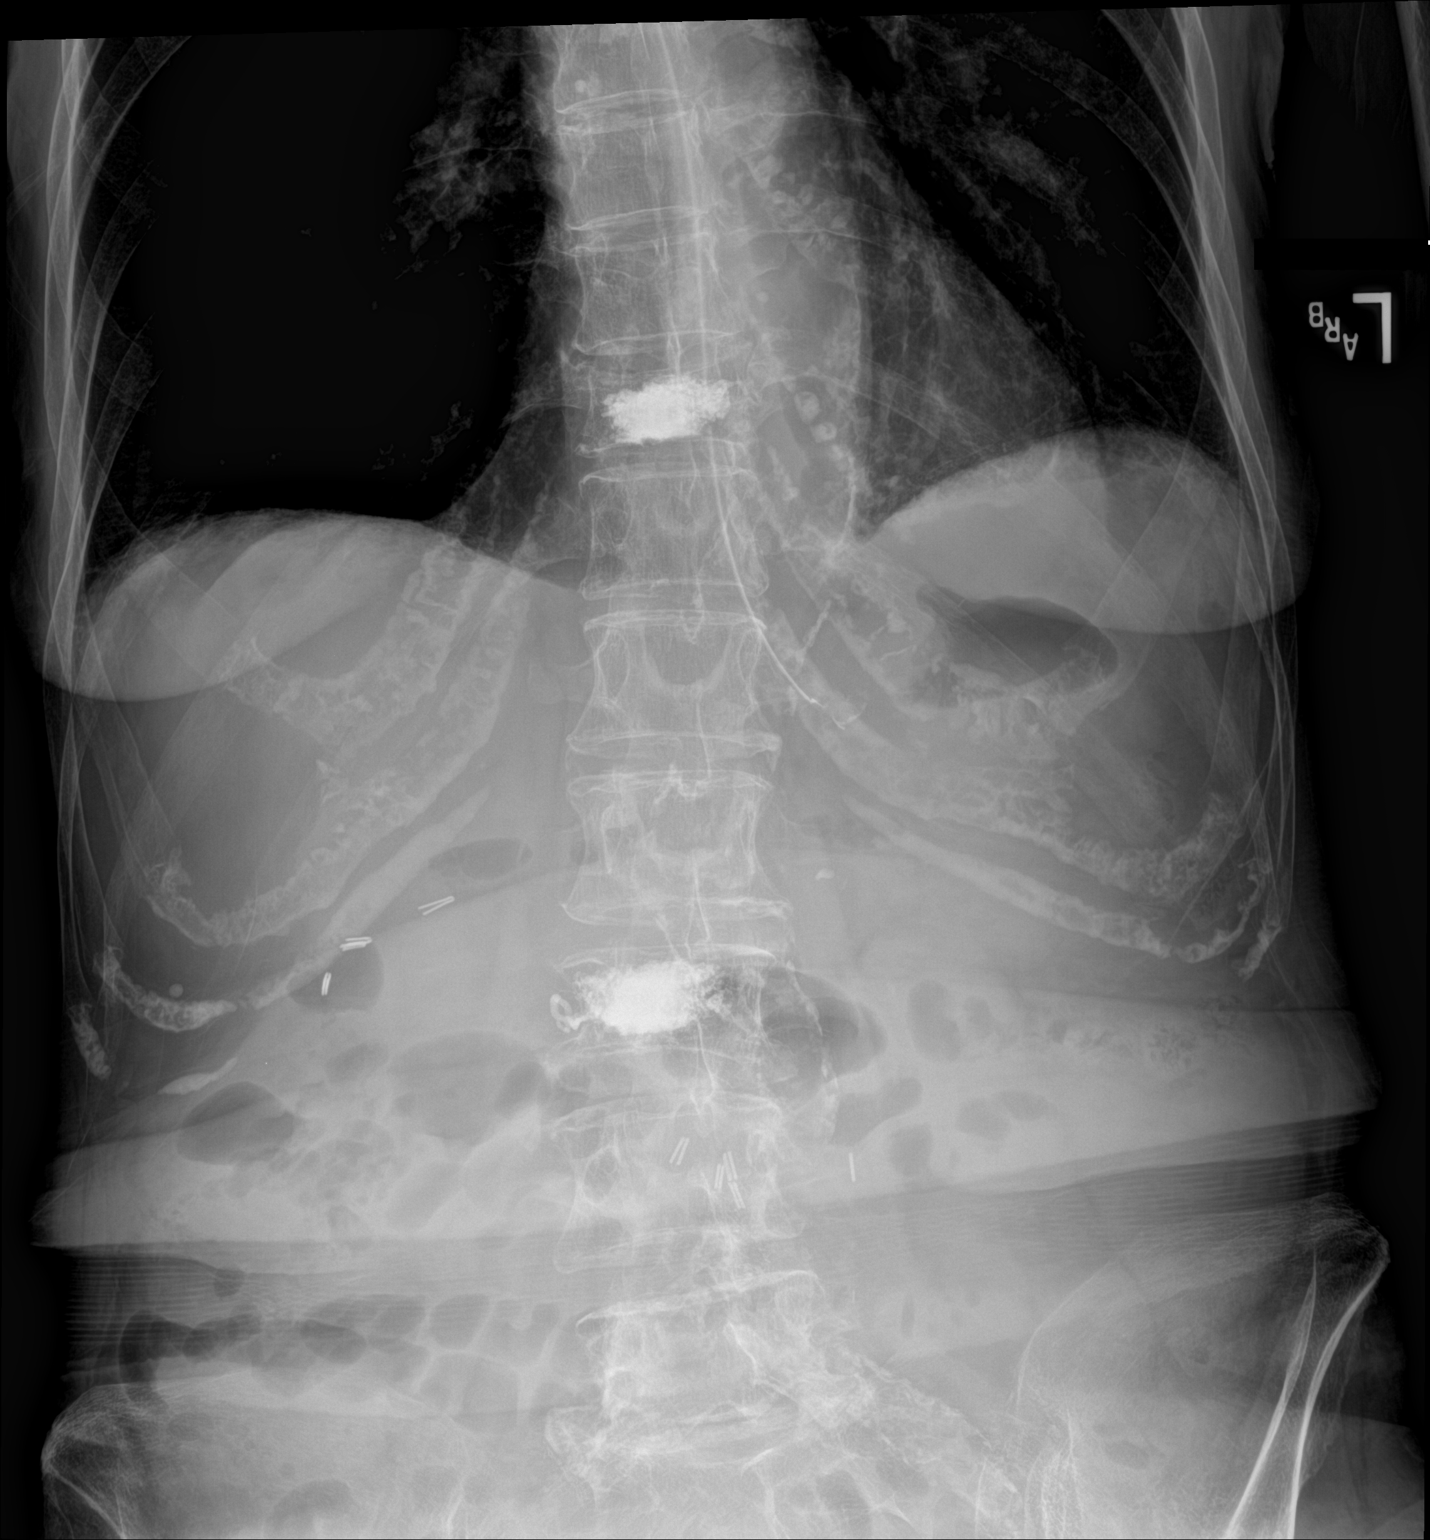

[2 of 2 positions shown; findings below may reference images not displayed]

FINDINGS: Enteric catheter is partially visualized, tip slightly past the
expected location of gastroesophageal junction, side hole within the
esophagus. There is persistent small bowel obstruction with maximum
diameter of dilated thick-walled small bowel loops of 3.2 cm. The
colon is decompressed. No evidence of free gas or pneumatosis. No
evidence of organomegaly.

Vertebroplasty of T11 and L3 is again seen. Postsurgical clips are
seen overlying the mid abdomen, and right upper outer quadrant.
IMPRESSION: Persistent small bowel obstruction, with diffuse small bowel wall
thickening. Differential diagnosis includes infectious or ischemic
causes.

Enteric catheter tip slightly past the expected location of the
gastroesophageal junction. Advancement may be considered.

## 2016-03-26 NOTE — Progress Notes (Signed)
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  INTERVAL HISTORY: Patient returns to clinic today to discuss her pathology results, imaging results, and treatment planning. She continues to have shortness of breath, but otherwise feels well. She does not report any additional weight loss. She continues to have weakness and fatigue, but states this is improved since her last clinic visit. Currently, she is anxious but otherwise feels well. She denies any fevers or illnesses. She denies any chest pain. She has no nausea, vomiting, constipation, or diarrhea. She has no urinary complaints. Patient offers no further specific complaints.  REVIEW OF SYSTEMS:   Review of Systems  Constitutional: Positive for malaise/fatigue and weight loss. Negative for fever.  Eyes: Negative.  Negative for blurred vision and double vision.  Respiratory: Positive for cough and shortness of breath. Negative for hemoptysis.   Cardiovascular: Negative.  Negative for chest pain and leg swelling.  Gastrointestinal: Negative.  Negative for abdominal pain.  Genitourinary: Negative.   Musculoskeletal: Negative.   Neurological: Positive for weakness and headaches. Negative for focal weakness.  Psychiatric/Behavioral: The patient is nervous/anxious.     As per HPI. Otherwise, a complete review of systems is negative.  PAST MEDICAL HISTORY: Past Medical History:  Diagnosis Date  . 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 Cancer ([place]) 03/10/2016   lung  .  Chronic kidney disease   . Collagenous colitis   . COPD (chronic obstructive pulmonary disease) ([place])   . DDD (degenerative disc disease), cervical   . Depression   . Hernia of abdominal cavity   . Hyperlipidemia   . Osteoporosis   . Pneumonia   . Stroke ([place])   . Tobacco abuse   . Urine incontinence   . Vitamin B 12 deficiency     PAST SURGICAL HISTORY: Past Surgical History:  Procedure Laterality Date  . ABDOMINAL AORTIC ANEURYSM REPAIR    . ABDOMINAL HYSTERECTOMY    . APPENDECTOMY    . BACK SURGERY  [phone]  . CHOLECYSTECTOMY    . FLEXIBLE BRONCHOSCOPY N/A 03/17/2016   Procedure: FLEXIBLE BRONCHOSCOPY;  Surgeon: Wilhelmina Mcardle, MD;  Location: [facility] ORS;  Service: Pulmonary;  Laterality: N/A;    FAMILY HISTORY: Family History  Problem Relation Age of Onset  . Diabetes Mother   . Diabetes Father     ADVANCED DIRECTIVES (Y/N):  N  HEALTH MAINTENANCE: Social History  Substance Use Topics  . Smoking status: Former Smoker    Packs/day: 0.25    Types: Cigarettes  . Smokeless tobacco: Never Used     Comment: quit smoking 03/04/16  . Alcohol use No     Colonoscopy:  PAP:  Bone density:  Lipid panel:  Allergies  Allergen Reactions  . Hydrocodone Itching and Rash  . Codeine Nausea And Vomiting  . Morphine And Related Nausea And Vomiting  . Ultram [Tramadol] Itching    Current Outpatient Prescriptions  Medication Sig Dispense Refill  . albuterol (PROVENTIL HFA) 108 (90 Base) MCG/ACT inhaler Inhale 2 puffs into the lungs every 4 (four) hours as needed for wheezing or  shortness of breath. 1 Inhaler 0  . atorvastatin (LIPITOR) 20 MG tablet Take 20 mg by mouth at bedtime.    . bisacodyl (DULCOLAX) 5 MG EC tablet Take 5 mg by mouth daily as needed for moderate constipation.    Marland Kitchen buPROPion (WELLBUTRIN SR) 100 MG 12 hr tablet 1 tab qAM for 3 days, then 1 tab BID, pick a day to quit smoking in the 2nd week (Patient taking differently: Take 100 mg by mouth 2 (two) times  daily. ) 60 tablet 1  . calcium carbonate (OS-CAL) 600 MG TABS tablet Take 600 mg by mouth at bedtime.     . clopidogrel (PLAVIX) 75 MG tablet Take 75 mg by mouth at bedtime.    . cyanocobalamin (,VITAMIN B-12,) 1000 MCG/ML injection Inject 1,000 mcg into the muscle every 30 (thirty) days.    Marland Kitchen levothyroxine (SYNTHROID, LEVOTHROID) 75 MCG tablet Take 1 tablet (75 mcg total) by mouth daily before breakfast. (Patient taking differently: Take 75 mcg by mouth at bedtime. ) 90 tablet 0  . lidocaine (LIDODERM) 5 % Place 1 patch onto the skin daily. Remove & Discard patch within 12 hours or as directed by MD 30 patch 0  . LORazepam (ATIVAN) 1 MG tablet Take 0.5 tablets (0.5 mg total) by mouth daily as needed for anxiety. 30 tablet 1  . mometasone (NASONEX) 50 MCG/ACT nasal spray Place 2 sprays into the nose daily as needed (for rhinitis).     Marland Kitchen omeprazole (PRILOSEC) 20 MG capsule Take 1 capsule (20 mg total) by mouth daily. Take two capsules daily for 3-4 weeks, then return to previous dose of 1 capsule daily (Patient taking differently: Take 20 mg by mouth daily as needed. For acid reflux) 30 capsule 0  . lidocaine-prilocaine (EMLA) cream Apply to affected area once 30 g 3  . ondansetron (ZOFRAN) 8 MG tablet Take 1 tablet (8 mg total) by mouth 2 (two) times daily as needed for refractory nausea / vomiting. 30 tablet 2  . prochlorperazine (COMPAZINE) 10 MG tablet Take 1 tablet (10 mg total) by mouth every 6 (six) hours as needed (Nausea or vomiting). 30 tablet 1   No current facility-administered medications for this visit.     OBJECTIVE: Vitals:   03/27/16 1548  BP: (!) 145/95  Pulse: 92  Resp: 18  Temp: (!) 95.1 F (35.1 C)     Body mass index is 14.06 kg/m.    ECOG FS:2 - Symptomatic, <50% confined to bed  General: Thin, no acute distress. Eyes: Pink conjunctiva, anicteric sclera. HEENT: Normocephalic, moist mucous membranes, clear oropharnyx. Lungs: Clear to auscultation  bilaterally. Heart: Regular rate and rhythm. No rubs, murmurs, or gallops. Abdomen: Soft, nontender, nondistended. No organomegaly noted, normoactive bowel sounds. Musculoskeletal: No edema, cyanosis, or clubbing. Neuro: Alert, answering all questions appropriately. Cranial nerves grossly intact. Skin: No rashes or petechiae noted. Psych: Normal affect.   LAB RESULTS:  Lab Results  Component Value Date   NA 136 03/11/2016   K 3.9 03/11/2016   CL 103 03/11/2016   CO2 24 03/11/2016   GLUCOSE 82 03/11/2016   BUN 20 03/11/2016   CREATININE 0.89 03/11/2016   CALCIUM 8.9 03/11/2016   PROT 6.2 02/21/2016   ALBUMIN 3.9 02/21/2016   AST 25 02/21/2016   ALT 13 02/21/2016   ALKPHOS 69 02/21/2016   BILITOT 0.6 02/21/2016   GFRNONAA >60 03/11/2016   GFRAA >60 03/11/2016    Lab Results  Component Value Date   WBC 7.3 03/11/2016  NEUTROABS 5.2 03/11/2016   HGB 15.0 03/11/2016   HCT 43.3 03/11/2016   MCV 90.7 03/11/2016   PLT 200 03/11/2016     STUDIES: Dg Chest 2 View  Result Date: 03/12/2016 CLINICAL DATA:  Mid back pain for 2 days.  History of lung cancer. EXAM: CHEST  2 VIEW COMPARISON:  CT scan March 10, 2016.  Chest x-ray from same date. FINDINGS: The patient has known lung cancer centered in the left perihilar region. There is significant volume loss on the left and hyperexpansion on the right. The right lung remains clear. Previous vertebroplasties again identified. No other interval changes or acute abnormalities are noted. Anterior wedging of an upper thoracic vertebral body is unchanged since at least February 28, 2016. IMPRESSION: The patient's known left lung cancer was better assessed on yesterday's CT scan. There is persistent volume loss on the left and hyperexpansion on the right. No acute interval changes. Electronically Signed   By: Dorise Bullion III M.D   On: 03/12/2016 00:31   Dg Chest 2 View  Result Date: 03/10/2016 CLINICAL DATA:  Worsening chest pain and  shortness of breath beginning yesterday. Recent pneumonia. COPD. EXAM: CHEST  2 VIEW COMPARISON:  02/28/2016 FINDINGS: Pulmonary hyperinflation again seen, consistent with COPD. Progressive left lower lobe volume loss and airspace opacity is seen. This could be due to worsening pneumonia or central endobronchial obstruction. Right lung hyperinflation noted. Right lung is clear. Heart size is within normal limits. Aortic atherosclerosis. Previous lower thoracic and upper lumbar vertebroplasties again noted. IMPRESSION: Progressive left lower lobe volume loss and airspace opacity, which could be due to worsening pneumonia or central endobronchial obstruction. Chest CT with contrast is recommended to exclude centrally obstructing mass or lymphadenopathy COPD. Aortic atherosclerosis. Electronically Signed   By: Earle Gell M.D.   On: 03/10/2016 11:42   Ct Angio Chest Pe W And/or Wo Contrast  Result Date: 03/10/2016 CLINICAL DATA:  Left-sided chest pain and shortness of breath beginning last night. Recent pneumonia. COPD. EXAM: CT ANGIOGRAPHY CHEST WITH CONTRAST TECHNIQUE: Multidetector CT imaging of the chest was performed using the standard protocol during bolus administration of intravenous contrast. Multiplanar CT image reconstructions and MIPs were obtained to evaluate the vascular anatomy. CONTRAST:  75 mL Isovue 370 COMPARISON:  Chest radiograph 03/10/2016 FINDINGS: Cardiovascular: Satisfactory opacification of pulmonary arteries is seen, and there is no evidence of acute pulmonary embolism. Normal heart size. Ascending thoracic aortic aneurysm is seen measuring 4.0 cm, however there is no evidence of thoracic aortic dissection. Aortic atherosclerosis. Mediastinum/Lymph Nodes: Mediastinal lymphadenopathy is seen in the precarinal and subcarinal regions, with largest area in subcarinal region measuring 3.4 cm in short axis on image 42/8. Mild mediastinal lymphadenopathy also seen within the prevascular space.  Mild right hilar lymphadenopathy is also seen, with largest lymph node measuring 1.3 cm on image 55/8. Lungs/Pleura: Diffuse left lung volume loss is demonstrated. A large central masslike opacity is seen involving the left upper and lower lobes and left lung hilum, which narrows the left mainstem bronchus and obstructs the central upper and lower lobe bronchi. This measures approximately 9.6 x 5.0 cm on image 41/11. Some postobstructive atelectasis or pneumonitis is seen in the peripheral left lower lobe. A small left pleural effusion is also seen, suspicious for malignant effusion. Compensatory hyperinflation of the right lung is seen. No evidence of right lung mass or infiltrate. Upper abdomen: No acute findings. Musculoskeletal: No chest wall mass or suspicious bone lesions identified. Old T11 vertebroplasty demonstrated. Review  of the MIP images confirms the above findings. IMPRESSION: No evidence of acute pulmonary embolism. 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 No evidence of thoracic aortic dissection. Left lung volume loss with large central masslike opacity involving left upper and lower lobes and left hilum, highly suspicious for bronchogenic carcinoma. Consider bronchoscopy for further evaluation. Mediastinal and right hilar lymphadenopathy,, highly suspicious for metastatic disease. Small left pleural effusion.  Consider thoracentesis for cytology. Electronically Signed   By: Earle Gell M.D.   On: 03/10/2016 13:14   Mr Jeri Cos [place] Contrast  Result Date: 03/15/2016 CLINICAL DATA:  Initial staging of lung cancer.  Global headaches. EXAM: MRI HEAD WITHOUT AND WITH CONTRAST TECHNIQUE: Multiplanar, multiecho pulse sequences of the brain and surrounding structures were obtained without and with intravenous contrast. CONTRAST:  20m MULTIHANCE GADOBENATE DIMEGLUMINE 529 MG/ML IV SOLN COMPARISON:  Head CT 07/23/2015 FINDINGS: Calvarium and upper cervical spine: No focal marrow signal abnormality.  Orbits: Bilateral cataract resection. Sinuses and Mastoids: Clear. Brain: No evidence of metastatic disease. No abnormal enhancement or edema. Advanced chronic microvascular ischemic disease in this patient with multiple vascular risk factors, with confluent ischemic gliosis throughout the cerebral white matter and patchy involvement in the pons and deep cerebellum. Prominent dilated perivascular spaces. Normal brain volume for age. Remote hemorrhagic focus in the posterior left temporal lobe, likely incidental in isolation. No acute infarct, acute hemorrhage, hydrocephalus, or major vessel occlusion. IMPRESSION: 1. Negative for intracranial metastasis. 2. Advanced chronic microvascular disease. Electronically Signed   By: JMonte FantasiaM.D.   On: 03/15/2016 09:56   Nm Pet Image Initial (pi) Skull Base To Thigh  Result Date: 03/21/2016 CLINICAL DATA:  Initial treatment strategy for suspected primary bronchogenic carcinoma of the central left lung, status post bronchoscopic biopsy 03/17/2016 with pathology pending. EXAM: NUCLEAR MEDICINE PET SKULL BASE TO THIGH TECHNIQUE: 12.3 mCi F-18 FDG was injected intravenously. Full-ring PET imaging was performed from the skull base to thigh after the radiotracer. CT data was obtained and used for attenuation correction and anatomic localization. FASTING BLOOD GLUCOSE:  Value: 81 mg/dl COMPARISON:  03/10/2016 chest CT angiogram. 01/07/2016 CT abdomen/pelvis. FINDINGS: NECK Hypermetabolic 0.7 cm right supraclavicular/ level 4 neck node with max SUV 7.4 (series 4/ image 49). No additional hypermetabolic lymph nodes in the neck. CHEST Large irregular infiltrative central left lung mass is intensely hypermetabolic with max SUV 270.6and measures 6.6 x 6.2 cm in maximum axial dimensions (series 4/image 82), consistent with a primary bronchogenic carcinoma, extending throughout the left lower lobe and central left upper lobe and confluent with hypermetabolic left hilar  adenopathy. There is high-grade narrowing of the left upper and left lower lobar bronchi and moderate narrowing of the left mainstem bronchus. There are patchy secretions in the central airways. Trace left pleural effusion. Hypermetabolic 0.6 cm peribronchial central right middle lobe nodule with max SUV 4.0. Hypermetabolic subcarinal adenopathy measuring up to 2.2 cm with max SUV 14.7 (series 4/ image 92), which is confluent with the central left lung mass. Hypermetabolic AP window adenopathy measuring up to 1.7 cm with max SUV 17.4 (series 4/image 77), which is confluent with the central left lung mass. Hypermetabolic bilateral lower paratracheal adenopathy encasing the carina and lower trachea measuring up to 2.2 cm thickness with max SUV 18.4 (series 4/image 79), which is confluent with the central left lung mass. Hypermetabolic right lower paraesophageal adenopathy measuring up to the 1.5 cm with max SUV 13.1 (series 4/image 123). Hypermetabolic high right mediastinal 1.0 cm lymph  node between the right subclavian artery and upper thoracic esophagus (series 4/image 53) with max SUV 6.3. Hypermetabolic high left paratracheal 0.8 cm node with max SUV 8.1 (series 4/image 60). Trace pericardial effusion/thickening. Left main, left anterior descending, left circumflex and right coronary atherosclerosis. No right pleural effusion. Stable volume loss in the left lung. ABDOMEN/PELVIS No abnormal hypermetabolic activity within the liver, pancreas, adrenal glands, or spleen. No hypermetabolic lymph nodes in the abdomen or pelvis. Cholecystectomy. Abdominal aortic atherosclerosis. Status post surgical aorto bi-iliac graft. SKELETON Focal hypermetabolism in the right sacrum adjacent to the right sacroiliac joint with max SUV 7.4, most consistent with osseous metastasis, which appears occult on the CT images. No additional foci of skeletal hypermetabolism. Vertebroplasty changes in the T11 and L3 vertebral bodies.  IMPRESSION: 1. Intensely hypermetabolic (max SUV 71.0) large irregular infiltrative central left lung mass, consistent with primary bronchogenic carcinoma, which is confluent with hypermetabolic left hilar, subcarinal, AP window and bilateral lower paratracheal nodal metastatic disease, which encases the carina and lower trachea. Stable volume loss in the left lung with high-grade narrowing of the left upper and left lower lobe bronchi and moderate narrowing of the left mainstem bronchus. 2. Hypermetabolic nodal metastases in the right supraclavicular neck, bilateral high mediastinum and right lower paraesophageal mediastinum. 3. Hypermetabolic peribronchial pulmonary metastasis in the central right middle lobe. 4. Trace left pleural effusion, probably malignant. 5. Hypermetabolic right sacral bone metastasis. 6. Additional findings include aortic atherosclerosis, left main and 3 vessel coronary atherosclerosis and trace pericardial effusion. Electronically Signed   By: Ilona Sorrel M.D.   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 Patient noted to have bilateral disease on bronchoscopy. PET scan results reviewed independently and reported as above with bilateral lung metastasis as well as a hypermetabolic right sacral bone metastasis. MRI the brain is negative for intracranial metastasis. Despite patient's decreased performance status, she wishes to attempt aggressive treatment with chemotherapy. She will require port placement prior to initiating treatment. Plan to give carboplatinum and etoposide every 3 weeks for 4 cycles. Patient will return to clinic on April 05, 2016 to receive cycle 1, day 1 of carboplatinum and etoposide. She will receive etoposide only on days 2 and 3. Patient will also receive OnPro Neulasta support on  day 3 of each cycle.  2. Headaches: MRI negative for metastasis, monitor. 3. Pain: Pain is slightly worse, the patient has an allergy to codeine. Monitor. 4. Weight loss: Likely multifactorial including possible underlying malignancy. Consider Megace in the future.  Approximately 30 minutes was spent in discussion of which greater than 50% was consultation.  Patient expressed understanding and was in agreement with this plan. She also understands that She can call clinic at any time with any questions, concerns, or complaints.   Primary cancer of left lung metastatic to other site [facility])   Staging form: Lung, AJCC 7th Edition   - Clinical stage from 03/12/2016: Stage IV (T3, N3, M1a) - Signed by Lloyd Huger, MD on 03/26/2016  Lloyd Huger, MD   03/29/2016 10:39 PM

## 2016-03-27 ENCOUNTER — Inpatient Hospital Stay (HOSPITAL_BASED_OUTPATIENT_CLINIC_OR_DEPARTMENT_OTHER): Payer: Commercial Managed Care - HMO | Admitting: Oncology

## 2016-03-27 VITALS — BP 145/95 | HR 92 | Temp 95.1°F | Resp 18 | Wt 79.4 lb

## 2016-03-27 DIAGNOSIS — J90 Pleural effusion, not elsewhere classified: Secondary | ICD-10-CM | POA: Diagnosis not present

## 2016-03-27 DIAGNOSIS — R634 Abnormal weight loss: Secondary | ICD-10-CM | POA: Diagnosis not present

## 2016-03-27 DIAGNOSIS — E538 Deficiency of other specified B group vitamins: Secondary | ICD-10-CM

## 2016-03-27 DIAGNOSIS — M899 Disorder of bone, unspecified: Secondary | ICD-10-CM | POA: Diagnosis not present

## 2016-03-27 DIAGNOSIS — E785 Hyperlipidemia, unspecified: Secondary | ICD-10-CM

## 2016-03-27 DIAGNOSIS — R0602 Shortness of breath: Secondary | ICD-10-CM

## 2016-03-27 DIAGNOSIS — R109 Unspecified abdominal pain: Secondary | ICD-10-CM | POA: Diagnosis not present

## 2016-03-27 DIAGNOSIS — Z8701 Personal history of pneumonia (recurrent): Secondary | ICD-10-CM

## 2016-03-27 DIAGNOSIS — R5383 Other fatigue: Secondary | ICD-10-CM

## 2016-03-27 DIAGNOSIS — I2510 Atherosclerotic heart disease of native coronary artery without angina pectoris: Secondary | ICD-10-CM | POA: Diagnosis not present

## 2016-03-27 DIAGNOSIS — F419 Anxiety disorder, unspecified: Secondary | ICD-10-CM

## 2016-03-27 DIAGNOSIS — R531 Weakness: Secondary | ICD-10-CM

## 2016-03-27 DIAGNOSIS — C7801 Secondary malignant neoplasm of right lung: Secondary | ICD-10-CM | POA: Diagnosis not present

## 2016-03-27 DIAGNOSIS — M5030 Other cervical disc degeneration, unspecified cervical region: Secondary | ICD-10-CM

## 2016-03-27 DIAGNOSIS — R05 Cough: Secondary | ICD-10-CM

## 2016-03-27 DIAGNOSIS — C3402 Malignant neoplasm of left main bronchus: Secondary | ICD-10-CM

## 2016-03-27 DIAGNOSIS — R32 Unspecified urinary incontinence: Secondary | ICD-10-CM

## 2016-03-27 DIAGNOSIS — R911 Solitary pulmonary nodule: Secondary | ICD-10-CM

## 2016-03-27 DIAGNOSIS — R51 Headache: Secondary | ICD-10-CM

## 2016-03-27 DIAGNOSIS — Z79899 Other long term (current) drug therapy: Secondary | ICD-10-CM

## 2016-03-27 DIAGNOSIS — M818 Other osteoporosis without current pathological fracture: Secondary | ICD-10-CM

## 2016-03-27 DIAGNOSIS — N189 Chronic kidney disease, unspecified: Secondary | ICD-10-CM

## 2016-03-27 DIAGNOSIS — C7951 Secondary malignant neoplasm of bone: Secondary | ICD-10-CM | POA: Diagnosis not present

## 2016-03-27 DIAGNOSIS — C3492 Malignant neoplasm of unspecified part of left bronchus or lung: Secondary | ICD-10-CM

## 2016-03-27 DIAGNOSIS — K458 Other specified abdominal hernia without obstruction or gangrene: Secondary | ICD-10-CM

## 2016-03-27 DIAGNOSIS — Z8673 Personal history of transient ischemic attack (TIA), and cerebral infarction without residual deficits: Secondary | ICD-10-CM

## 2016-03-27 DIAGNOSIS — I714 Abdominal aortic aneurysm, without rupture: Secondary | ICD-10-CM

## 2016-03-27 DIAGNOSIS — J449 Chronic obstructive pulmonary disease, unspecified: Secondary | ICD-10-CM

## 2016-03-27 DIAGNOSIS — F17210 Nicotine dependence, cigarettes, uncomplicated: Secondary | ICD-10-CM

## 2016-03-27 NOTE — Progress Notes (Signed)
States is feeling weak today but better since last visit.

## 2016-03-28 ENCOUNTER — Telehealth: Payer: Self-pay | Admitting: *Deleted

## 2016-03-28 NOTE — Telephone Encounter (Signed)
Called to report that she thinks she is allergic to Hydrocodone, her face and neck are itching and braeking out in a rash. Please advise

## 2016-03-28 NOTE — Patient Instructions (Signed)
Etoposide, VP-16 injection  What is this medicine?  ETOPOSIDE, VP-16 (e toe POE side) is a chemotherapy drug. It is used to treat testicular cancer, lung cancer, and other cancers.  This medicine may be used for other purposes; ask your health care provider or pharmacist if you have questions.  What should I tell my health care provider before I take this medicine?  They need to know if you have any of these conditions:  -infection  -kidney disease  -low blood counts, like low white cell, platelet, or red cell counts  -an unusual or allergic reaction to etoposide, other chemotherapeutic agents, other medicines, foods, dyes, or preservatives  -pregnant or trying to get pregnant  -breast-feeding  How should I use this medicine?  This medicine is for infusion into a vein. It is administered in a hospital or clinic by a specially trained health care professional.  Talk to your pediatrician regarding the use of this medicine in children. Special care may be needed.  Overdosage: If you think you have taken too much of this medicine contact a poison control center or emergency room at once.  NOTE: This medicine is only for you. Do not share this medicine with others.  What if I miss a dose?  It is important not to miss your dose. Call your doctor or health care professional if you are unable to keep an appointment.  What may interact with this medicine?  -aspirin  -certain medications for seizures like carbamazepine, phenobarbital, phenytoin, valproic acid  -cyclosporine  -levamisole  -warfarin  This list may not describe all possible interactions. Give your health care provider a list of all the medicines, herbs, non-prescription drugs, or dietary supplements you use. Also tell them if you smoke, drink alcohol, or use illegal drugs. Some items may interact with your medicine.  What should I watch for while using this medicine?  Visit your doctor for checks on your progress. This drug may make you feel generally unwell.  This is not uncommon, as chemotherapy can affect healthy cells as well as cancer cells. Report any side effects. Continue your course of treatment even though you feel ill unless your doctor tells you to stop.  In some cases, you may be given additional medicines to help with side effects. Follow all directions for their use.  Call your doctor or health care professional for advice if you get a fever, chills or sore throat, or other symptoms of a cold or flu. Do not treat yourself. This drug decreases your body's ability to fight infections. Try to avoid being around people who are sick.  This medicine may increase your risk to bruise or bleed. Call your doctor or health care professional if you notice any unusual bleeding.  Be careful brushing and flossing your teeth or using a toothpick because you may get an infection or bleed more easily. If you have any dental work done, tell your dentist you are receiving this medicine.  Avoid taking products that contain aspirin, acetaminophen, ibuprofen, naproxen, or ketoprofen unless instructed by your doctor. These medicines may hide a fever.  Do not become pregnant while taking this medicine or for at least 6 months after stopping it. Women should inform their doctor if they wish to become pregnant or think they might be pregnant. Women of child-bearing potential will need to have a negative pregnancy test before starting this medicine. There is a potential for serious side effects to an unborn child. Talk to your health care professional or   pharmacist for more information. Do not breast-feed an infant while taking this medicine.  Men must use a latex condom during sexual contact with a woman while taking this medicine and for at least 4 months after stopping it. A latex condom is needed even if you have had a vasectomy. Contact your doctor right away if your partner becomes pregnant. Do not donate sperm while taking this medicine and for at least 4 months after you stop  taking this medicine. Men should inform their doctors if they wish to father a child. This medicine may lower sperm counts.  What side effects may I notice from receiving this medicine?  Side effects that you should report to your doctor or health care professional as soon as possible:  -allergic reactions like skin rash, itching or hives, swelling of the face, lips, or tongue  -low blood counts - this medicine may decrease the number of white blood cells, red blood cells and platelets. You may be at increased risk for infections and bleeding.  -signs of infection - fever or chills, cough, sore throat, pain or difficulty passing urine  -signs of decreased platelets or bleeding - bruising, pinpoint red spots on the skin, black, tarry stools, blood in the urine  -signs of decreased red blood cells - unusually weak or tired, fainting spells, lightheadedness  -breathing problems  -changes in vision  -mouth or throat sores or ulcers  -pain, redness, swelling or irritation at the injection site  -pain, tingling, numbness in the hands or feet  -redness, blistering, peeling or loosening of the skin, including inside the mouth  -seizures  -vomiting  Side effects that usually do not require medical attention (report to your doctor or health care professional if they continue or are bothersome):  -diarrhea  -hair loss  -loss of appetite  -nausea  -stomach pain  This list may not describe all possible side effects. Call your doctor for medical advice about side effects. You may report side effects to FDA at 1-800-FDA-1088.  Where should I keep my medicine?  This drug is given in a hospital or clinic and will not be stored at home.  NOTE: This sheet is a summary. It may not cover all possible information. If you have questions about this medicine, talk to your doctor, pharmacist, or health care provider.      2016, Elsevier/Gold Standard. (2014-02-12 12:32:50)  Carboplatin injection  What is this medicine?  CARBOPLATIN (KAR boe  pla tin) is a chemotherapy drug. It targets fast dividing cells, like cancer cells, and causes these cells to die. This medicine is used to treat ovarian cancer and many other cancers.  This medicine may be used for other purposes; ask your health care provider or pharmacist if you have questions.  What should I tell my health care provider before I take this medicine?  They need to know if you have any of these conditions:  -blood disorders  -hearing problems  -kidney disease  -recent or ongoing radiation therapy  -an unusual or allergic reaction to carboplatin, cisplatin, other chemotherapy, other medicines, foods, dyes, or preservatives  -pregnant or trying to get pregnant  -breast-feeding  How should I use this medicine?  This drug is usually given as an infusion into a vein. It is administered in a hospital or clinic by a specially trained health care professional.  Talk to your pediatrician regarding the use of this medicine in children. Special care may be needed.  Overdosage: If you think you have taken   too much of this medicine contact a poison control center or emergency room at once.  NOTE: This medicine is only for you. Do not share this medicine with others.  What if I miss a dose?  It is important not to miss a dose. Call your doctor or health care professional if you are unable to keep an appointment.  What may interact with this medicine?  -medicines for seizures  -medicines to increase blood counts like filgrastim, pegfilgrastim, sargramostim  -some antibiotics like amikacin, gentamicin, neomycin, streptomycin, tobramycin  -vaccines  Talk to your doctor or health care professional before taking any of these medicines:  -acetaminophen  -aspirin  -ibuprofen  -ketoprofen  -naproxen  This list may not describe all possible interactions. Give your health care provider a list of all the medicines, herbs, non-prescription drugs, or dietary supplements you use. Also tell them if you smoke, drink alcohol, or  use illegal drugs. Some items may interact with your medicine.  What should I watch for while using this medicine?  Your condition will be monitored carefully while you are receiving this medicine. You will need important blood work done while you are taking this medicine.  This drug may make you feel generally unwell. This is not uncommon, as chemotherapy can affect healthy cells as well as cancer cells. Report any side effects. Continue your course of treatment even though you feel ill unless your doctor tells you to stop.  In some cases, you may be given additional medicines to help with side effects. Follow all directions for their use.  Call your doctor or health care professional for advice if you get a fever, chills or sore throat, or other symptoms of a cold or flu. Do not treat yourself. This drug decreases your body's ability to fight infections. Try to avoid being around people who are sick.  This medicine may increase your risk to bruise or bleed. Call your doctor or health care professional if you notice any unusual bleeding.  Be careful brushing and flossing your teeth or using a toothpick because you may get an infection or bleed more easily. If you have any dental work done, tell your dentist you are receiving this medicine.  Avoid taking products that contain aspirin, acetaminophen, ibuprofen, naproxen, or ketoprofen unless instructed by your doctor. These medicines may hide a fever.  Do not become pregnant while taking this medicine. Women should inform their doctor if they wish to become pregnant or think they might be pregnant. There is a potential for serious side effects to an unborn child. Talk to your health care professional or pharmacist for more information. Do not breast-feed an infant while taking this medicine.  What side effects may I notice from receiving this medicine?  Side effects that you should report to your doctor or health care professional as soon as possible:  -allergic  reactions like skin rash, itching or hives, swelling of the face, lips, or tongue  -signs of infection - fever or chills, cough, sore throat, pain or difficulty passing urine  -signs of decreased platelets or bleeding - bruising, pinpoint red spots on the skin, black, tarry stools, nosebleeds  -signs of decreased red blood cells - unusually weak or tired, fainting spells, lightheadedness  -breathing problems  -changes in hearing  -changes in vision  -chest pain  -high blood pressure  -low blood counts - This drug may decrease the number of white blood cells, red blood cells and platelets. You may be at increased risk for   infections and bleeding.  -nausea and vomiting  -pain, swelling, redness or irritation at the injection site  -pain, tingling, numbness in the hands or feet  -problems with balance, talking, walking  -trouble passing urine or change in the amount of urine  Side effects that usually do not require medical attention (report to your doctor or health care professional if they continue or are bothersome):  -hair loss  -loss of appetite  -metallic taste in the mouth or changes in taste  This list may not describe all possible side effects. Call your doctor for medical advice about side effects. You may report side effects to FDA at 1-800-FDA-1088.  Where should I keep my medicine?  This drug is given in a hospital or clinic and will not be stored at home.  NOTE: This sheet is a summary. It may not cover all possible information. If you have questions about this medicine, talk to your doctor, pharmacist, or health care provider.      2016, Elsevier/Gold Standard. (2007-09-24 14:38:05)

## 2016-03-28 NOTE — Telephone Encounter (Signed)
Per Dr Grayland Ormond, due to her allergies, she can take Tylenol or try taking Benadryl with her Hydrocodone. She opts to try taking Benadryl

## 2016-03-29 ENCOUNTER — Other Ambulatory Visit: Payer: Self-pay | Admitting: Vascular Surgery

## 2016-03-29 MED ORDER — LIDOCAINE-PRILOCAINE 2.5-2.5 % EX CREA: TOPICAL_CREAM | CUTANEOUS | 3 refills | Status: DC

## 2016-03-29 MED ORDER — PROCHLORPERAZINE MALEATE 10 MG PO TABS: 10.0000 mg | ORAL_TABLET | Freq: Four times a day (QID) | ORAL | 1 refills | Status: DC | PRN

## 2016-03-29 MED ORDER — ONDANSETRON HCL 8 MG PO TABS: 8.0000 mg | ORAL_TABLET | Freq: Two times a day (BID) | ORAL | 2 refills | Status: DC | PRN

## 2016-03-29 NOTE — Progress Notes (Signed)
START ON PATHWAY REGIMEN - Small Cell Lung  [id number]: Etoposide 100 mg/m2 Days 1, 2, 3 + Carboplatin AUC=5 Day 1 q21 Days x 4 Cycles   A cycle is every 21 days:     Etoposide (Toposar(R)) 100 mg/m2 in 500 mL NS IV over 2 hours days 1-3 Dose Mod: None     Carboplatin (Paraplatin(R)) AUC=5 in 500 mL NS IV over 1 hour day 1 only Dose Mod: None Additional Orders: * All AUC calculations intended to be used in [employer] formula  **Always confirm dose/schedule in your pharmacy ordering system**    Patient Characteristics: Extensive Stage, First Line Stage Grouping: Extensive AJCC M Stage: X AJCC N Stage: X AJCC T Stage: X Line of therapy: First Line Would you be surprised if this patient died  in the next year? I would NOT be surprised if this patient died in the next year  Intent of Therapy: Non-Curative / Palliative Intent, Discussed with Patient

## 2016-03-30 ENCOUNTER — Inpatient Hospital Stay: Payer: Commercial Managed Care - HMO

## 2016-03-30 ENCOUNTER — Encounter: Payer: Self-pay | Admitting: *Deleted

## 2016-03-30 ENCOUNTER — Other Ambulatory Visit: Payer: Self-pay | Admitting: *Deleted

## 2016-03-30 NOTE — Patient Outreach (Signed)
[facility]) Care Management  03/30/2016  Melody Green 1940-10-14 [id number]   Phone call to patient at the request of [facility] telephonic RNCM to assess for community resource needs.  Per RNCM patient has symptoms of depression related to her new diagnosis of lung cancer.  Patient is also in need of a advanced directive and possible financial assistance.  Patient  very pleasant and engaging over the phone, explaining that her mood has improved.  Patient discussed that she did have some  depression related to her new cancer diagnosis, however she is now feeling hopeful and taking one day at a time.  Patient describes a positive support network of family and friends, stating that she is accompanied to every medical appointment by a family member. She also lives with her son which is very helpful.  Patient denies any thoughts of harm to her self or others.  Patient states that she has a strong spiritual life and her funeral plans have already been arranged. She discussed having a living will but does not have a health care power of attorney.  Per patient, she will get this completed upon her return to the [facility].  Patient states that her medical bills are paid at this time however  she is  concerned that she may not be able to cover the cost of her upcoming treatments. Per patient, she has already met with a financial counselor at the [facility] on 03/27/16.  Plan: Patient verbalized having no additional community resource needs at this time.  Patient was given this social worker's contact information if any needs should arise in the future.  Patient to be closed to [facility] care management at this time. Patient's provider's office to be notified of case closure.   Sheralyn Boatman [facility] Care Management [phone]

## 2016-03-31 ENCOUNTER — Telehealth: Payer: Self-pay | Admitting: *Deleted

## 2016-03-31 DIAGNOSIS — C3492 Malignant neoplasm of unspecified part of left bronchus or lung: Secondary | ICD-10-CM

## 2016-03-31 MED ORDER — LIDOCAINE-PRILOCAINE 2.5-2.5 % EX CREA: TOPICAL_CREAM | CUTANEOUS | 3 refills | Status: DC

## 2016-03-31 NOTE — Telephone Encounter (Signed)
Needed clarification on directions of EMLA cream, resubmitted rx

## 2016-04-03 ENCOUNTER — Ambulatory Visit
Admission: RE | Admit: 2016-04-03 | Discharge: 2016-04-03 | Disposition: A | Discharge status: Home / Self Care | Payer: Commercial Managed Care - HMO | Source: Ambulatory Visit | Attending: Vascular Surgery | Admitting: Vascular Surgery

## 2016-04-03 ENCOUNTER — Encounter
Admission: RE | Disposition: A | Discharge status: Home / Self Care | Payer: Self-pay | Source: Ambulatory Visit | Attending: Vascular Surgery

## 2016-04-03 DIAGNOSIS — Z87891 Personal history of nicotine dependence: Secondary | ICD-10-CM | POA: Insufficient documentation

## 2016-04-03 DIAGNOSIS — K52831 Collagenous colitis: Secondary | ICD-10-CM | POA: Diagnosis not present

## 2016-04-03 DIAGNOSIS — Z9049 Acquired absence of other specified parts of digestive tract: Secondary | ICD-10-CM | POA: Insufficient documentation

## 2016-04-03 DIAGNOSIS — Z885 Allergy status to narcotic agent status: Secondary | ICD-10-CM | POA: Insufficient documentation

## 2016-04-03 DIAGNOSIS — M810 Age-related osteoporosis without current pathological fracture: Secondary | ICD-10-CM | POA: Diagnosis not present

## 2016-04-03 DIAGNOSIS — C7801 Secondary malignant neoplasm of right lung: Secondary | ICD-10-CM | POA: Diagnosis not present

## 2016-04-03 DIAGNOSIS — E785 Hyperlipidemia, unspecified: Secondary | ICD-10-CM | POA: Insufficient documentation

## 2016-04-03 DIAGNOSIS — J449 Chronic obstructive pulmonary disease, unspecified: Secondary | ICD-10-CM | POA: Insufficient documentation

## 2016-04-03 DIAGNOSIS — M5030 Other cervical disc degeneration, unspecified cervical region: Secondary | ICD-10-CM | POA: Diagnosis not present

## 2016-04-03 DIAGNOSIS — Z8673 Personal history of transient ischemic attack (TIA), and cerebral infarction without residual deficits: Secondary | ICD-10-CM | POA: Diagnosis not present

## 2016-04-03 DIAGNOSIS — R52 Pain, unspecified: Secondary | ICD-10-CM | POA: Insufficient documentation

## 2016-04-03 DIAGNOSIS — E538 Deficiency of other specified B group vitamins: Secondary | ICD-10-CM | POA: Insufficient documentation

## 2016-04-03 DIAGNOSIS — N189 Chronic kidney disease, unspecified: Secondary | ICD-10-CM | POA: Insufficient documentation

## 2016-04-03 DIAGNOSIS — C3492 Malignant neoplasm of unspecified part of left bronchus or lung: Secondary | ICD-10-CM | POA: Insufficient documentation

## 2016-04-03 DIAGNOSIS — R634 Abnormal weight loss: Secondary | ICD-10-CM | POA: Insufficient documentation

## 2016-04-03 DIAGNOSIS — Z90710 Acquired absence of both cervix and uterus: Secondary | ICD-10-CM | POA: Diagnosis not present

## 2016-04-03 DIAGNOSIS — I714 Abdominal aortic aneurysm, without rupture: Secondary | ICD-10-CM | POA: Insufficient documentation

## 2016-04-03 DIAGNOSIS — Z7902 Long term (current) use of antithrombotics/antiplatelets: Secondary | ICD-10-CM | POA: Insufficient documentation

## 2016-04-03 DIAGNOSIS — Z833 Family history of diabetes mellitus: Secondary | ICD-10-CM | POA: Insufficient documentation

## 2016-04-03 HISTORY — PX: PERIPHERAL VASCULAR CATHETERIZATION: SHX172C

## 2016-04-03 SURGERY — PORTA CATH INSERTION
Anesthesia: Moderate Sedation

## 2016-04-03 MED ORDER — ONDANSETRON HCL 4 MG/2ML IJ SOLN: 4.0000 mg | Freq: Four times a day (QID) | INTRAMUSCULAR | Status: DC | PRN

## 2016-04-03 MED ORDER — MIDAZOLAM HCL 2 MG/2ML IJ SOLN
INTRAMUSCULAR | Status: DC | PRN
  Administered 2016-04-03: 1 mg via INTRAVENOUS

## 2016-04-03 MED ORDER — SODIUM CHLORIDE 0.9 % IV SOLN
INTRAVENOUS | Status: DC
  Administered 2016-04-03: 11:00:00 via INTRAVENOUS

## 2016-04-03 MED ORDER — SODIUM CHLORIDE 0.9 % IR SOLN
Freq: Once | Status: DC
  Filled 2016-04-03: qty 2

## 2016-04-03 MED ORDER — DEXTROSE 5 % IV SOLN
1.5000 g | INTRAVENOUS | Status: AC
  Administered 2016-04-03: 1.5 g via INTRAVENOUS

## 2016-04-03 MED ORDER — LIDOCAINE-EPINEPHRINE (PF) 1 %-1:200000 IJ SOLN
INTRAMUSCULAR | Status: AC
  Filled 2016-04-03: qty 30

## 2016-04-03 MED ORDER — FENTANYL CITRATE (PF) 100 MCG/2ML IJ SOLN
INTRAMUSCULAR | Status: AC
  Filled 2016-04-03: qty 2

## 2016-04-03 MED ORDER — FENTANYL CITRATE (PF) 100 MCG/2ML IJ SOLN
INTRAMUSCULAR | Status: DC | PRN
  Administered 2016-04-03 (×2): 25 ug via INTRAVENOUS

## 2016-04-03 MED ORDER — MIDAZOLAM HCL 2 MG/2ML IJ SOLN
INTRAMUSCULAR | Status: AC
  Filled 2016-04-03: qty 2

## 2016-04-03 MED ORDER — HEPARIN (PORCINE) IN NACL 2-0.9 UNIT/ML-% IJ SOLN
INTRAMUSCULAR | Status: AC
  Filled 2016-04-03: qty 500

## 2016-04-03 MED ORDER — HYDROMORPHONE HCL 1 MG/ML IJ SOLN: 1.0000 mg | Freq: Once | INTRAMUSCULAR | Status: DC

## 2016-04-03 SURGICAL SUPPLY — 10 items
BAG DECANTER STRL (MISCELLANEOUS) ×3 IMPLANT
KIT PORT POWER 8FR ISP CVUE (Catheter) ×3 IMPLANT
PACK ANGIOGRAPHY (CUSTOM PROCEDURE TRAY) ×3 IMPLANT
PAD GROUND ADULT SPLIT (MISCELLANEOUS) ×3 IMPLANT
PENCIL ELECTRO HAND CTR (MISCELLANEOUS) ×3 IMPLANT
PREP CHG 10.5 TEAL (MISCELLANEOUS) ×3 IMPLANT
SUT MNCRL AB 4-0 PS2 18 (SUTURE) ×3 IMPLANT
SUT PROLENE 0 CT 1 30 (SUTURE) ×3 IMPLANT
SUTURE VIC 3-0 (SUTURE) ×3 IMPLANT
TOWEL OR 17X26 4PK STRL BLUE (TOWEL DISPOSABLE) ×3 IMPLANT

## 2016-04-03 NOTE — Progress Notes (Signed)
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  INTERVAL HISTORY: Patient returns to clinic today for further evaluation and initiation of cycle 1 of carboplatinum and etoposide. She continues to have shortness of breath, but otherwise feels well. She does not report any additional weight loss. She continues to have weakness and fatigue. She denies any fevers or illnesses. She denies any chest pain, cough, or hemoptysis. She has no nausea, vomiting, constipation, or diarrhea. She has no urinary complaints. Patient offers no further specific complaints.  REVIEW OF SYSTEMS:   Review of Systems  Constitutional: Positive for malaise/fatigue and weight loss. Negative for fever.  Eyes: Negative.  Negative for blurred vision and double vision.  Respiratory: Positive for shortness of breath. Negative for cough and hemoptysis.   Cardiovascular: Negative.  Negative for chest pain and leg swelling.  Gastrointestinal: Negative.  Negative for abdominal pain.  Genitourinary: Negative.   Musculoskeletal: Negative.   Neurological: Positive for weakness and headaches. Negative for focal weakness.  Psychiatric/Behavioral: The patient is nervous/anxious.     As per HPI. Otherwise, a complete review of systems is negative.  PAST MEDICAL HISTORY: Past Medical History:  Diagnosis Date  . 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 Cancer ([place]) 03/10/2016   lung  . Chronic kidney disease   . Collagenous colitis   . COPD (chronic obstructive pulmonary disease) ([place])   . DDD (degenerative disc disease), cervical    . Depression   . Hernia of abdominal cavity   . Hyperlipidemia   . Osteoporosis   . Pneumonia   . Stroke ([place])   . Tobacco abuse   . Urine incontinence   . Vitamin B 12 deficiency     PAST SURGICAL HISTORY: Past Surgical History:  Procedure Laterality Date  . ABDOMINAL AORTIC ANEURYSM REPAIR    . ABDOMINAL HYSTERECTOMY    . APPENDECTOMY    . BACK SURGERY  [phone]  . CHOLECYSTECTOMY    . FLEXIBLE BRONCHOSCOPY N/A 03/17/2016   Procedure: FLEXIBLE BRONCHOSCOPY;  Surgeon: Wilhelmina Mcardle, MD;  Location: [facility] ORS;  Service: Pulmonary;  Laterality: N/A;  . PERIPHERAL VASCULAR CATHETERIZATION N/A 04/03/2016   Procedure: Glori Luis Cath Insertion;  Surgeon: Algernon Huxley, MD;  Location: [facility];  Service: Cardiovascular;  Laterality: N/A;    FAMILY HISTORY: Family History  Problem Relation Age of Onset  . Diabetes Mother   . Diabetes Father     ADVANCED DIRECTIVES (Y/N):  N  HEALTH MAINTENANCE: Social History  Substance Use Topics  . Smoking status: Former Smoker    Packs/day: 0.25    Types: Cigarettes  . Smokeless tobacco: Never Used     Comment: quit smoking 03/04/16  . Alcohol use No     Colonoscopy:  PAP:  Bone density:  Lipid panel:  Allergies  Allergen Reactions  . Hydrocodone Itching and Rash  . Codeine Nausea And Vomiting  . Morphine And Related Nausea And Vomiting  . Ultram [Tramadol] Itching    Current Outpatient Prescriptions  Medication Sig Dispense Refill  . albuterol (PROVENTIL HFA) 108 (90 Base) MCG/ACT inhaler Inhale 2 puffs into the lungs every  4 (four) hours as needed for wheezing or shortness of breath. 1 Inhaler 0  . atorvastatin (LIPITOR) 20 MG tablet Take 20 mg by mouth at bedtime.    . bisacodyl (DULCOLAX) 5 MG EC tablet Take 5 mg by mouth daily as needed for moderate constipation.    Marland Kitchen buPROPion (WELLBUTRIN SR) 100 MG 12 hr tablet 1 tab qAM for 3 days, then 1 tab BID, pick a day to quit smoking in the 2nd week (Patient taking  differently: Take 100 mg by mouth 2 (two) times daily. ) 60 tablet 1  . calcium carbonate (OS-CAL) 600 MG TABS tablet Take 600 mg by mouth at bedtime.     . clopidogrel (PLAVIX) 75 MG tablet Take 75 mg by mouth at bedtime.    . cyanocobalamin (,VITAMIN B-12,) 1000 MCG/ML injection Inject 1,000 mcg into the muscle every 30 (thirty) days.    Marland Kitchen levothyroxine (SYNTHROID, LEVOTHROID) 75 MCG tablet Take 1 tablet (75 mcg total) by mouth daily before breakfast. (Patient taking differently: Take 75 mcg by mouth at bedtime. ) 90 tablet 0  . lidocaine (LIDODERM) 5 % Place 1 patch onto the skin daily. Remove & Discard patch within 12 hours or as directed by MD 30 patch 0  . lidocaine-prilocaine (EMLA) cream Apply to port site 1 hour before port is accessed each time 30 g 3  . LORazepam (ATIVAN) 1 MG tablet Take 0.5 tablets (0.5 mg total) by mouth daily as needed for anxiety. 30 tablet 1  . mometasone (NASONEX) 50 MCG/ACT nasal spray Place 2 sprays into the nose daily as needed (for rhinitis).     Marland Kitchen omeprazole (PRILOSEC) 20 MG capsule Take 1 capsule (20 mg total) by mouth daily. Take two capsules daily for 3-4 weeks, then return to previous dose of 1 capsule daily (Patient taking differently: Take 20 mg by mouth daily as needed. For acid reflux) 30 capsule 0  . ondansetron (ZOFRAN) 8 MG tablet Take 1 tablet (8 mg total) by mouth 2 (two) times daily as needed for refractory nausea / vomiting. 30 tablet 2  . prochlorperazine (COMPAZINE) 10 MG tablet Take 1 tablet (10 mg total) by mouth every 6 (six) hours as needed (Nausea or vomiting). 30 tablet 1   No current facility-administered medications for this visit.     OBJECTIVE: Vitals:   04/05/16 0924  BP: 116/75  Pulse: 92  Resp: 18  Temp: (!) 95 F (35 C)     Body mass index is 13.92 kg/m.    ECOG FS:2 - Symptomatic, <50% confined to bed  General: Thin, no acute distress. Eyes: Pink conjunctiva, anicteric sclera. HEENT: Normocephalic, moist mucous  membranes, clear oropharnyx. Lungs: Clear to auscultation bilaterally. Heart: Regular rate and rhythm. No rubs, murmurs, or gallops. Abdomen: Soft, nontender, nondistended. No organomegaly noted, normoactive bowel sounds. Musculoskeletal: No edema, cyanosis, or clubbing. Neuro: Alert, answering all questions appropriately. Cranial nerves grossly intact. Skin: No rashes or petechiae noted. Psych: Normal affect.   LAB RESULTS:  Lab Results  Component Value Date   NA 134 (L) 04/05/2016   K 3.4 (L) 04/05/2016   CL 101 04/05/2016   CO2 25 04/05/2016   GLUCOSE 81 04/05/2016   BUN 20 04/05/2016   CREATININE 0.86 04/05/2016   CALCIUM 8.8 (L) 04/05/2016   PROT 6.5 04/05/2016   ALBUMIN 3.5 04/05/2016   AST 32 04/05/2016   ALT 19 04/05/2016   ALKPHOS 70 04/05/2016   BILITOT 0.5 04/05/2016   GFRNONAA >60 04/05/2016  GFRAA >60 04/05/2016    Lab Results  Component Value Date   WBC 10.9 04/05/2016   NEUTROABS 8.8 (H) 04/05/2016   HGB 13.4 04/05/2016   HCT 39.7 04/05/2016   MCV 91.9 04/05/2016   PLT 232 04/05/2016     STUDIES: Dg Chest 2 View  Result Date: 03/12/2016 CLINICAL DATA:  Mid back pain for 2 days.  History of lung cancer. EXAM: CHEST  2 VIEW COMPARISON:  CT scan March 10, 2016.  Chest x-ray from same date. FINDINGS: The patient has known lung cancer centered in the left perihilar region. There is significant volume loss on the left and hyperexpansion on the right. The right lung remains clear. Previous vertebroplasties again identified. No other interval changes or acute abnormalities are noted. Anterior wedging of an upper thoracic vertebral body is unchanged since at least February 28, 2016. IMPRESSION: The patient's known left lung cancer was better assessed on yesterday's CT scan. There is persistent volume loss on the left and hyperexpansion on the right. No acute interval changes. Electronically Signed   By: Dorise Bullion III M.D   On: 03/12/2016 00:31   Dg Chest  2 View  Result Date: 03/10/2016 CLINICAL DATA:  Worsening chest pain and shortness of breath beginning yesterday. Recent pneumonia. COPD. EXAM: CHEST  2 VIEW COMPARISON:  02/28/2016 FINDINGS: Pulmonary hyperinflation again seen, consistent with COPD. Progressive left lower lobe volume loss and airspace opacity is seen. This could be due to worsening pneumonia or central endobronchial obstruction. Right lung hyperinflation noted. Right lung is clear. Heart size is within normal limits. Aortic atherosclerosis. Previous lower thoracic and upper lumbar vertebroplasties again noted. IMPRESSION: Progressive left lower lobe volume loss and airspace opacity, which could be due to worsening pneumonia or central endobronchial obstruction. Chest CT with contrast is recommended to exclude centrally obstructing mass or lymphadenopathy COPD. Aortic atherosclerosis. Electronically Signed   By: Earle Gell M.D.   On: 03/10/2016 11:42   Ct Angio Chest Pe W And/or Wo Contrast  Result Date: 03/10/2016 CLINICAL DATA:  Left-sided chest pain and shortness of breath beginning last night. Recent pneumonia. COPD. EXAM: CT ANGIOGRAPHY CHEST WITH CONTRAST TECHNIQUE: Multidetector CT imaging of the chest was performed using the standard protocol during bolus administration of intravenous contrast. Multiplanar CT image reconstructions and MIPs were obtained to evaluate the vascular anatomy. CONTRAST:  75 mL Isovue 370 COMPARISON:  Chest radiograph 03/10/2016 FINDINGS: Cardiovascular: Satisfactory opacification of pulmonary arteries is seen, and there is no evidence of acute pulmonary embolism. Normal heart size. Ascending thoracic aortic aneurysm is seen measuring 4.0 cm, however there is no evidence of thoracic aortic dissection. Aortic atherosclerosis. Mediastinum/Lymph Nodes: Mediastinal lymphadenopathy is seen in the precarinal and subcarinal regions, with largest area in subcarinal region measuring 3.4 cm in short axis on image 42/8.  Mild mediastinal lymphadenopathy also seen within the prevascular space. Mild right hilar lymphadenopathy is also seen, with largest lymph node measuring 1.3 cm on image 55/8. Lungs/Pleura: Diffuse left lung volume loss is demonstrated. A large central masslike opacity is seen involving the left upper and lower lobes and left lung hilum, which narrows the left mainstem bronchus and obstructs the central upper and lower lobe bronchi. This measures approximately 9.6 x 5.0 cm on image 41/11. Some postobstructive atelectasis or pneumonitis is seen in the peripheral left lower lobe. A small left pleural effusion is also seen, suspicious for malignant effusion. Compensatory hyperinflation of the right lung is seen. No evidence of right lung mass or infiltrate.  Upper abdomen: No acute findings. Musculoskeletal: No chest wall mass or suspicious bone lesions identified. Old T11 vertebroplasty demonstrated. Review of the MIP images confirms the above findings. IMPRESSION: No evidence of acute pulmonary embolism. 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 No evidence of thoracic aortic dissection. Left lung volume loss with large central masslike opacity involving left upper and lower lobes and left hilum, highly suspicious for bronchogenic carcinoma. Consider bronchoscopy for further evaluation. Mediastinal and right hilar lymphadenopathy,, highly suspicious for metastatic disease. Small left pleural effusion.  Consider thoracentesis for cytology. Electronically Signed   By: Earle Gell M.D.   On: 03/10/2016 13:14   Mr Jeri Cos OE Contrast  Result Date: 03/15/2016 CLINICAL DATA:  Initial staging of lung cancer.  Global headaches. EXAM: MRI HEAD WITHOUT AND WITH CONTRAST TECHNIQUE: Multiplanar, multiecho pulse sequences of the brain and surrounding structures were obtained without and with intravenous contrast. CONTRAST:  34m MULTIHANCE GADOBENATE DIMEGLUMINE 529 MG/ML IV SOLN COMPARISON:  Head CT 07/23/2015 FINDINGS:  Calvarium and upper cervical spine: No focal marrow signal abnormality. Orbits: Bilateral cataract resection. Sinuses and Mastoids: Clear. Brain: No evidence of metastatic disease. No abnormal enhancement or edema. Advanced chronic microvascular ischemic disease in this patient with multiple vascular risk factors, with confluent ischemic gliosis throughout the cerebral white matter and patchy involvement in the pons and deep cerebellum. Prominent dilated perivascular spaces. Normal brain volume for age. Remote hemorrhagic focus in the posterior left temporal lobe, likely incidental in isolation. No acute infarct, acute hemorrhage, hydrocephalus, or major vessel occlusion. IMPRESSION: 1. Negative for intracranial metastasis. 2. Advanced chronic microvascular disease. Electronically Signed   By: JMonte FantasiaM.D.   On: 03/15/2016 09:56   Nm Pet Image Initial (pi) Skull Base To Thigh  Result Date: 03/21/2016 CLINICAL DATA:  Initial treatment strategy for suspected primary bronchogenic carcinoma of the central left lung, status post bronchoscopic biopsy 03/17/2016 with pathology pending. EXAM: NUCLEAR MEDICINE PET SKULL BASE TO THIGH TECHNIQUE: 12.3 mCi F-18 FDG was injected intravenously. Full-ring PET imaging was performed from the skull base to thigh after the radiotracer. CT data was obtained and used for attenuation correction and anatomic localization. FASTING BLOOD GLUCOSE:  Value: 81 mg/dl COMPARISON:  03/10/2016 chest CT angiogram. 01/07/2016 CT abdomen/pelvis. FINDINGS: NECK Hypermetabolic 0.7 cm right supraclavicular/ level 4 neck node with max SUV 7.4 (series 4/ image 49). No additional hypermetabolic lymph nodes in the neck. CHEST Large irregular infiltrative central left lung mass is intensely hypermetabolic with max SUV 270.3and measures 6.6 x 6.2 cm in maximum axial dimensions (series 4/image 82), consistent with a primary bronchogenic carcinoma, extending throughout the left lower lobe and  central left upper lobe and confluent with hypermetabolic left hilar adenopathy. There is high-grade narrowing of the left upper and left lower lobar bronchi and moderate narrowing of the left mainstem bronchus. There are patchy secretions in the central airways. Trace left pleural effusion. Hypermetabolic 0.6 cm peribronchial central right middle lobe nodule with max SUV 4.0. Hypermetabolic subcarinal adenopathy measuring up to 2.2 cm with max SUV 14.7 (series 4/ image 92), which is confluent with the central left lung mass. Hypermetabolic AP window adenopathy measuring up to 1.7 cm with max SUV 17.4 (series 4/image 77), which is confluent with the central left lung mass. Hypermetabolic bilateral lower paratracheal adenopathy encasing the carina and lower trachea measuring up to 2.2 cm thickness with max SUV 18.4 (series 4/image 79), which is confluent with the central left lung mass. Hypermetabolic right lower paraesophageal adenopathy  measuring up to the 1.5 cm with max SUV 13.1 (series 4/image 123). Hypermetabolic high right mediastinal 1.0 cm lymph node between the right subclavian artery and upper thoracic esophagus (series 4/image 53) with max SUV 6.3. Hypermetabolic high left paratracheal 0.8 cm node with max SUV 8.1 (series 4/image 60). Trace pericardial effusion/thickening. Left main, left anterior descending, left circumflex and right coronary atherosclerosis. No right pleural effusion. Stable volume loss in the left lung. ABDOMEN/PELVIS No abnormal hypermetabolic activity within the liver, pancreas, adrenal glands, or spleen. No hypermetabolic lymph nodes in the abdomen or pelvis. Cholecystectomy. Abdominal aortic atherosclerosis. Status post surgical aorto bi-iliac graft. SKELETON Focal hypermetabolism in the right sacrum adjacent to the right sacroiliac joint with max SUV 7.4, most consistent with osseous metastasis, which appears occult on the CT images. No additional foci of skeletal hypermetabolism.  Vertebroplasty changes in the T11 and L3 vertebral bodies. IMPRESSION: 1. Intensely hypermetabolic (max SUV 84.6) large irregular infiltrative central left lung mass, consistent with primary bronchogenic carcinoma, which is confluent with hypermetabolic left hilar, subcarinal, AP window and bilateral lower paratracheal nodal metastatic disease, which encases the carina and lower trachea. Stable volume loss in the left lung with high-grade narrowing of the left upper and left lower lobe bronchi and moderate narrowing of the left mainstem bronchus. 2. Hypermetabolic nodal metastases in the right supraclavicular neck, bilateral high mediastinum and right lower paraesophageal mediastinum. 3. Hypermetabolic peribronchial pulmonary metastasis in the central right middle lobe. 4. Trace left pleural effusion, probably malignant. 5. Hypermetabolic right sacral bone metastasis. 6. Additional findings include aortic atherosclerosis, left main and 3 vessel coronary atherosclerosis and trace pericardial effusion. Electronically Signed   By: Ilona Sorrel M.D.   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 Patient noted to have bilateral disease on bronchoscopy. PET scan results reviewed independently and reported as above with bilateral lung metastasis as well as a hypermetabolic right sacral bone metastasis. MRI the brain is negative for intracranial metastasis. Despite patient's decreased performance status, she wishes to attempt aggressive treatment with chemotherapy. Plan to give carboplatinum and etoposide every 3 weeks for 4 cycles. Proceed with cycle 1, day 1 of carboplatinum and etoposide today. Patient will also receive OnPro Neulasta support on day 3 of each cycle. Return to clinic in 1 and 2 days for etoposide only, in 1 week for  laboratory work and further evaluation and then in 3 weeks for consideration of cycle 2.  2. Headaches: MRI negative for metastasis, monitor. 3. Pain: Patient does not complain of pain today. Monitor. 4. Weight loss: Likely multifactorial including possible underlying malignancy. Consider Megace in the future.  Approximately 30 minutes was spent in discussion of which greater than 50% was consultation.  Patient expressed understanding and was in agreement with this plan. She also understands that She can call clinic at any time with any questions, concerns, or complaints.   Primary cancer of left lung metastatic to other site [facility])   Staging form: Lung, AJCC 7th Edition   - Clinical stage from 03/12/2016: Stage IV (T3, N3, M1a) - Signed by Lloyd Huger, MD on 03/26/2016  Lloyd Huger, MD   04/06/2016 8:43 AM

## 2016-04-03 NOTE — Op Note (Signed)
      [facility]       Operative Note  Date: 04/03/2016  Preoperative diagnosis:  1. Lung cancer  Postoperative diagnosis:  Same as above  Procedures: #1. Ultrasound guidance for vascular access to the right internal jugular vein. #2. Fluoroscopic guidance for placement of catheter. #3. Placement of CT compatible Port-A-Cath, right internal jugular vein.  Surgeon: Leotis Pain, MD.   Anesthesia: Local with moderate conscious sedation for approximately 20  minutes using 1 mg of Versed and 50 mcg of Fentanyl  Fluoroscopy time: less than 1 minute  Contrast used: 0  Estimated blood loss: Minimal  Indication for the procedure:  The patient is a 75 y.o.female with metastatic lung cancer.  The patient needs a Port-A-Cath for durable venous access, chemotherapy, lab draws, and CT scans. We are asked to place this. Risks and benefits were discussed and informed consent was obtained.  Description of procedure: The patient was brought to the vascular and interventional radiology suite.  Moderate conscious sedation was administered throughout the procedure during a face to face encounter with the patient with my supervision of the RN administering medicines and monitoring the patient's vital signs, pulse oximetry, telemetry and mental status throughout from the start of the procedure until the patient was taken to the recovery room. The right neck chest and shoulder were sterilely prepped and draped, and a sterile surgical field was created. Ultrasound was used to help visualize a patent right internal jugular vein. This was then accessed under direct ultrasound guidance without difficulty with the Seldinger needle and a permanent image was recorded. A J-wire was placed. After skin nick and dilatation, the peel-away sheath was then placed over the wire. I then anesthetized an area under the clavicle approximately 1-2 fingerbreadths. A transverse incision was created and an inferior  pocket was created with electrocautery and blunt dissection. The port was then brought onto the field, placed into the pocket and secured to the chest wall with 2 Prolene sutures. The catheter was connected to the port and tunneled from the subclavicular incision to the access site. Fluoroscopic guidance was then used to cut the catheter to an appropriate length. The catheter was then placed through the peel-away sheath and the peel-away sheath was removed. The catheter tip was parked in excellent location under fluorocoscopic guidance in the cavoatrial junction. The pocket was then irrigated with antibiotic impregnated saline and the wound was closed with a running 3-0 Vicryl and a 4-0 Monocryl. The access incision was closed with a single 4-0 Monocryl. The Huber needle was used to withdraw blood and flush the port with heparinized saline. Dermabond was then placed as a dressing. The patient tolerated the procedure well and was taken to the recovery room in stable condition.   Leotis Pain 04/03/2016 12:38 PM

## 2016-04-03 NOTE — H&P (Signed)
[facility] History & Physical Update  The patient was interviewed and re-examined.  The patient's previous History and Physical has been reviewed and is unchanged.  There is no change in the plan of care. We plan to proceed with the scheduled procedure.  Leotis Pain, MD  04/03/2016, 11:34 AM

## 2016-04-04 ENCOUNTER — Other Ambulatory Visit: Payer: Self-pay | Admitting: *Deleted

## 2016-04-04 ENCOUNTER — Encounter: Payer: Self-pay | Admitting: Vascular Surgery

## 2016-04-04 DIAGNOSIS — C3492 Malignant neoplasm of unspecified part of left bronchus or lung: Secondary | ICD-10-CM

## 2016-04-04 MED ORDER — LIDOCAINE-PRILOCAINE 2.5-2.5 % EX CREA: TOPICAL_CREAM | CUTANEOUS | 3 refills | Status: AC

## 2016-04-04 MED ORDER — PROCHLORPERAZINE MALEATE 10 MG PO TABS: 10.0000 mg | ORAL_TABLET | Freq: Four times a day (QID) | ORAL | 1 refills | Status: AC | PRN

## 2016-04-04 MED ORDER — ONDANSETRON HCL 8 MG PO TABS: 8.0000 mg | ORAL_TABLET | Freq: Two times a day (BID) | ORAL | 2 refills | Status: AC | PRN

## 2016-04-04 NOTE — Addendum Note (Signed)
Addended by: Jarrett Soho C on: 04/04/2016 12:17 PM   Modules accepted: Orders

## 2016-04-05 ENCOUNTER — Inpatient Hospital Stay (HOSPITAL_BASED_OUTPATIENT_CLINIC_OR_DEPARTMENT_OTHER): Payer: Commercial Managed Care - HMO | Admitting: Oncology

## 2016-04-05 ENCOUNTER — Inpatient Hospital Stay: Payer: Commercial Managed Care - HMO

## 2016-04-05 ENCOUNTER — Inpatient Hospital Stay: Payer: Commercial Managed Care - HMO | Attending: Oncology

## 2016-04-05 VITALS — BP 116/75 | HR 92 | Temp 95.0°F | Resp 18 | Wt 78.6 lb

## 2016-04-05 DIAGNOSIS — D696 Thrombocytopenia, unspecified: Secondary | ICD-10-CM | POA: Insufficient documentation

## 2016-04-05 DIAGNOSIS — K589 Irritable bowel syndrome without diarrhea: Secondary | ICD-10-CM

## 2016-04-05 DIAGNOSIS — R5383 Other fatigue: Secondary | ICD-10-CM | POA: Insufficient documentation

## 2016-04-05 DIAGNOSIS — C7801 Secondary malignant neoplasm of right lung: Secondary | ICD-10-CM | POA: Diagnosis not present

## 2016-04-05 DIAGNOSIS — E538 Deficiency of other specified B group vitamins: Secondary | ICD-10-CM

## 2016-04-05 DIAGNOSIS — F419 Anxiety disorder, unspecified: Secondary | ICD-10-CM | POA: Insufficient documentation

## 2016-04-05 DIAGNOSIS — J90 Pleural effusion, not elsewhere classified: Secondary | ICD-10-CM

## 2016-04-05 DIAGNOSIS — R531 Weakness: Secondary | ICD-10-CM

## 2016-04-05 DIAGNOSIS — K469 Unspecified abdominal hernia without obstruction or gangrene: Secondary | ICD-10-CM

## 2016-04-05 DIAGNOSIS — F329 Major depressive disorder, single episode, unspecified: Secondary | ICD-10-CM | POA: Diagnosis not present

## 2016-04-05 DIAGNOSIS — Z79899 Other long term (current) drug therapy: Secondary | ICD-10-CM

## 2016-04-05 DIAGNOSIS — I714 Abdominal aortic aneurysm, without rupture: Secondary | ICD-10-CM | POA: Diagnosis not present

## 2016-04-05 DIAGNOSIS — N189 Chronic kidney disease, unspecified: Secondary | ICD-10-CM | POA: Diagnosis not present

## 2016-04-05 DIAGNOSIS — J449 Chronic obstructive pulmonary disease, unspecified: Secondary | ICD-10-CM | POA: Diagnosis not present

## 2016-04-05 DIAGNOSIS — C7802 Secondary malignant neoplasm of left lung: Secondary | ICD-10-CM

## 2016-04-05 DIAGNOSIS — Z8701 Personal history of pneumonia (recurrent): Secondary | ICD-10-CM | POA: Insufficient documentation

## 2016-04-05 DIAGNOSIS — I129 Hypertensive chronic kidney disease with stage 1 through stage 4 chronic kidney disease, or unspecified chronic kidney disease: Secondary | ICD-10-CM | POA: Insufficient documentation

## 2016-04-05 DIAGNOSIS — C7951 Secondary malignant neoplasm of bone: Secondary | ICD-10-CM

## 2016-04-05 DIAGNOSIS — Z8673 Personal history of transient ischemic attack (TIA), and cerebral infarction without residual deficits: Secondary | ICD-10-CM | POA: Insufficient documentation

## 2016-04-05 DIAGNOSIS — E785 Hyperlipidemia, unspecified: Secondary | ICD-10-CM | POA: Diagnosis not present

## 2016-04-05 DIAGNOSIS — R590 Localized enlarged lymph nodes: Secondary | ICD-10-CM

## 2016-04-05 DIAGNOSIS — C3402 Malignant neoplasm of left main bronchus: Secondary | ICD-10-CM | POA: Diagnosis not present

## 2016-04-05 DIAGNOSIS — M546 Pain in thoracic spine: Secondary | ICD-10-CM

## 2016-04-05 DIAGNOSIS — D72819 Decreased white blood cell count, unspecified: Secondary | ICD-10-CM | POA: Diagnosis not present

## 2016-04-05 DIAGNOSIS — R32 Unspecified urinary incontinence: Secondary | ICD-10-CM | POA: Diagnosis not present

## 2016-04-05 DIAGNOSIS — E039 Hypothyroidism, unspecified: Secondary | ICD-10-CM

## 2016-04-05 DIAGNOSIS — R51 Headache: Secondary | ICD-10-CM | POA: Insufficient documentation

## 2016-04-05 DIAGNOSIS — M5030 Other cervical disc degeneration, unspecified cervical region: Secondary | ICD-10-CM | POA: Diagnosis not present

## 2016-04-05 DIAGNOSIS — C3492 Malignant neoplasm of unspecified part of left bronchus or lung: Secondary | ICD-10-CM

## 2016-04-05 DIAGNOSIS — M818 Other osteoporosis without current pathological fracture: Secondary | ICD-10-CM | POA: Insufficient documentation

## 2016-04-05 DIAGNOSIS — Z5111 Encounter for antineoplastic chemotherapy: Secondary | ICD-10-CM | POA: Diagnosis not present

## 2016-04-05 DIAGNOSIS — I2510 Atherosclerotic heart disease of native coronary artery without angina pectoris: Secondary | ICD-10-CM | POA: Insufficient documentation

## 2016-04-05 DIAGNOSIS — R634 Abnormal weight loss: Secondary | ICD-10-CM

## 2016-04-05 DIAGNOSIS — I712 Thoracic aortic aneurysm, without rupture: Secondary | ICD-10-CM | POA: Insufficient documentation

## 2016-04-05 DIAGNOSIS — F17210 Nicotine dependence, cigarettes, uncomplicated: Secondary | ICD-10-CM

## 2016-04-05 LAB — CBC WITH DIFFERENTIAL/PLATELET
BASOS ABS: 0.1 10*3/uL (ref 0–0.1)
BASOS PCT: 1 %
EOS ABS: 0.3 10*3/uL (ref 0–0.7)
EOS PCT: 2 %
HCT: 39.7 % (ref 35.0–47.0)
Hemoglobin: 13.4 g/dL (ref 12.0–16.0)
Lymphocytes Relative: 8 %
Lymphs Abs: 0.8 10*3/uL — ABNORMAL LOW (ref 1.0–3.6)
MCH: 31.1 pg (ref 26.0–34.0)
MCHC: 33.8 g/dL (ref 32.0–36.0)
MCV: 91.9 fL (ref 80.0–100.0)
Monocytes Absolute: 0.9 10*3/uL (ref 0.2–0.9)
Monocytes Relative: 8 %
NEUTROS PCT: 81 %
Neutro Abs: 8.8 10*3/uL — ABNORMAL HIGH (ref 1.4–6.5)
PLATELETS: 232 10*3/uL (ref 150–440)
RBC: 4.32 MIL/uL (ref 3.80–5.20)
RDW: 14.1 % (ref 11.5–14.5)
WBC: 10.9 10*3/uL (ref 3.6–11.0)

## 2016-04-05 LAB — COMPREHENSIVE METABOLIC PANEL
ALBUMIN: 3.5 g/dL (ref 3.5–5.0)
ALT: 19 U/L (ref 14–54)
AST: 32 U/L (ref 15–41)
Alkaline Phosphatase: 70 U/L (ref 38–126)
Anion gap: 8 (ref 5–15)
BUN: 20 mg/dL (ref 6–20)
CHLORIDE: 101 mmol/L (ref 101–111)
CO2: 25 mmol/L (ref 22–32)
CREATININE: 0.86 mg/dL (ref 0.44–1.00)
Calcium: 8.8 mg/dL — ABNORMAL LOW (ref 8.9–10.3)
GFR calc non Af Amer: >60 mL/min (ref >60)
GLUCOSE: 81 mg/dL (ref 65–99)
Potassium: 3.4 mmol/L — ABNORMAL LOW (ref 3.5–5.1)
SODIUM: 134 mmol/L — AB (ref 135–145)
Total Bilirubin: 0.5 mg/dL (ref 0.3–1.2)
Total Protein: 6.5 g/dL (ref 6.5–8.1)

## 2016-04-05 MED ORDER — SODIUM CHLORIDE 0.9 % IV SOLN
Freq: Once | INTRAVENOUS | Status: AC
  Administered 2016-04-05: 11:00:00 via INTRAVENOUS
  Filled 2016-04-05: qty 1000

## 2016-04-05 MED ORDER — SODIUM CHLORIDE 0.9 % IV SOLN
80.0000 mg/m2 | Freq: Once | INTRAVENOUS | Status: AC
  Administered 2016-04-05: 100 mg via INTRAVENOUS
  Filled 2016-04-05: qty 5

## 2016-04-05 MED ORDER — HEPARIN SOD (PORK) LOCK FLUSH 100 UNIT/ML IV SOLN
500.0000 [IU] | Freq: Once | INTRAVENOUS | Status: AC | PRN
  Administered 2016-04-05: 500 [IU]
  Filled 2016-04-05: qty 5

## 2016-04-05 MED ORDER — SODIUM CHLORIDE 0.9 % IV SOLN
10.0000 mg | Freq: Once | INTRAVENOUS | Status: AC
  Administered 2016-04-05: 10 mg via INTRAVENOUS
  Filled 2016-04-05: qty 1

## 2016-04-05 MED ORDER — SODIUM CHLORIDE 0.9 % IV SOLN
261.0000 mg | Freq: Once | INTRAVENOUS | Status: AC
  Administered 2016-04-05: 260 mg via INTRAVENOUS
  Filled 2016-04-05: qty 26

## 2016-04-05 MED ORDER — PALONOSETRON HCL INJECTION 0.25 MG/5ML
0.2500 mg | Freq: Once | INTRAVENOUS | Status: AC
  Administered 2016-04-05: 0.25 mg via INTRAVENOUS
  Filled 2016-04-05: qty 5

## 2016-04-05 NOTE — Progress Notes (Signed)
States is having sinus pain and pressure today due to nasal congestion.

## 2016-04-06 ENCOUNTER — Inpatient Hospital Stay: Payer: Commercial Managed Care - HMO

## 2016-04-06 VITALS — BP 146/87 | HR 81 | Temp 97.8°F | Resp 18

## 2016-04-06 DIAGNOSIS — C7801 Secondary malignant neoplasm of right lung: Secondary | ICD-10-CM | POA: Diagnosis not present

## 2016-04-06 DIAGNOSIS — R634 Abnormal weight loss: Secondary | ICD-10-CM | POA: Diagnosis not present

## 2016-04-06 DIAGNOSIS — C3492 Malignant neoplasm of unspecified part of left bronchus or lung: Secondary | ICD-10-CM

## 2016-04-06 DIAGNOSIS — Z5111 Encounter for antineoplastic chemotherapy: Secondary | ICD-10-CM | POA: Diagnosis not present

## 2016-04-06 DIAGNOSIS — R5383 Other fatigue: Secondary | ICD-10-CM | POA: Diagnosis not present

## 2016-04-06 DIAGNOSIS — D696 Thrombocytopenia, unspecified: Secondary | ICD-10-CM | POA: Diagnosis not present

## 2016-04-06 DIAGNOSIS — C3402 Malignant neoplasm of left main bronchus: Secondary | ICD-10-CM | POA: Diagnosis not present

## 2016-04-06 DIAGNOSIS — R51 Headache: Secondary | ICD-10-CM | POA: Diagnosis not present

## 2016-04-06 DIAGNOSIS — C7802 Secondary malignant neoplasm of left lung: Secondary | ICD-10-CM | POA: Diagnosis not present

## 2016-04-06 DIAGNOSIS — C7951 Secondary malignant neoplasm of bone: Secondary | ICD-10-CM | POA: Diagnosis not present

## 2016-04-06 MED ORDER — SODIUM CHLORIDE 0.9 % IV SOLN
Freq: Once | INTRAVENOUS | Status: AC
  Administered 2016-04-06: 14:00:00 via INTRAVENOUS
  Filled 2016-04-06: qty 1000

## 2016-04-06 MED ORDER — ETOPOSIDE CHEMO INJECTION 1 GM/50ML
80.0000 mg/m2 | Freq: Once | INTRAVENOUS | Status: AC
  Administered 2016-04-06: 100 mg via INTRAVENOUS
  Filled 2016-04-06: qty 5

## 2016-04-06 MED ORDER — HEPARIN SOD (PORK) LOCK FLUSH 100 UNIT/ML IV SOLN
500.0000 [IU] | Freq: Once | INTRAVENOUS | Status: AC
  Administered 2016-04-06: 500 [IU] via INTRAVENOUS
  Filled 2016-04-06: qty 5

## 2016-04-06 MED ORDER — DEXAMETHASONE SODIUM PHOSPHATE 100 MG/10ML IJ SOLN
10.0000 mg | Freq: Once | INTRAMUSCULAR | Status: AC
  Administered 2016-04-06: 10 mg via INTRAVENOUS
  Filled 2016-04-06: qty 1

## 2016-04-06 MED ORDER — SODIUM CHLORIDE 0.9 % IJ SOLN
10.0000 mL | Freq: Once | INTRAMUSCULAR | Status: AC
  Administered 2016-04-06: 10 mL via INTRAVENOUS
  Filled 2016-04-06: qty 10

## 2016-04-07 ENCOUNTER — Inpatient Hospital Stay: Payer: Commercial Managed Care - HMO

## 2016-04-07 VITALS — BP 146/92 | HR 81 | Temp 96.7°F | Resp 18

## 2016-04-07 DIAGNOSIS — C7951 Secondary malignant neoplasm of bone: Secondary | ICD-10-CM | POA: Diagnosis not present

## 2016-04-07 DIAGNOSIS — R5383 Other fatigue: Secondary | ICD-10-CM | POA: Diagnosis not present

## 2016-04-07 DIAGNOSIS — D696 Thrombocytopenia, unspecified: Secondary | ICD-10-CM | POA: Diagnosis not present

## 2016-04-07 DIAGNOSIS — R634 Abnormal weight loss: Secondary | ICD-10-CM | POA: Diagnosis not present

## 2016-04-07 DIAGNOSIS — Z5111 Encounter for antineoplastic chemotherapy: Secondary | ICD-10-CM | POA: Diagnosis not present

## 2016-04-07 DIAGNOSIS — C3492 Malignant neoplasm of unspecified part of left bronchus or lung: Secondary | ICD-10-CM

## 2016-04-07 DIAGNOSIS — C7801 Secondary malignant neoplasm of right lung: Secondary | ICD-10-CM | POA: Diagnosis not present

## 2016-04-07 DIAGNOSIS — C3402 Malignant neoplasm of left main bronchus: Secondary | ICD-10-CM | POA: Diagnosis not present

## 2016-04-07 DIAGNOSIS — C7802 Secondary malignant neoplasm of left lung: Secondary | ICD-10-CM | POA: Diagnosis not present

## 2016-04-07 DIAGNOSIS — R51 Headache: Secondary | ICD-10-CM | POA: Diagnosis not present

## 2016-04-07 MED ORDER — SODIUM CHLORIDE 0.9 % IV SOLN
Freq: Once | INTRAVENOUS | Status: AC
  Administered 2016-04-07: 14:00:00 via INTRAVENOUS
  Filled 2016-04-07: qty 1000

## 2016-04-07 MED ORDER — DEXAMETHASONE SODIUM PHOSPHATE 100 MG/10ML IJ SOLN
10.0000 mg | Freq: Once | INTRAMUSCULAR | Status: AC
  Administered 2016-04-07: 10 mg via INTRAVENOUS
  Filled 2016-04-07: qty 1

## 2016-04-07 MED ORDER — PEGFILGRASTIM 6 MG/0.6ML ~~LOC~~ PSKT
6.0000 mg | PREFILLED_SYRINGE | Freq: Once | SUBCUTANEOUS | Status: AC
  Administered 2016-04-07: 6 mg via SUBCUTANEOUS
  Filled 2016-04-07: qty 0.6

## 2016-04-07 MED ORDER — SODIUM CHLORIDE 0.9 % IV SOLN
80.0000 mg/m2 | Freq: Once | INTRAVENOUS | Status: AC
  Administered 2016-04-07: 100 mg via INTRAVENOUS
  Filled 2016-04-07: qty 5

## 2016-04-07 MED ORDER — HEPARIN SOD (PORK) LOCK FLUSH 100 UNIT/ML IV SOLN
INTRAVENOUS | Status: AC
  Filled 2016-04-07: qty 5

## 2016-04-07 MED ORDER — HEPARIN SOD (PORK) LOCK FLUSH 100 UNIT/ML IV SOLN
500.0000 [IU] | Freq: Once | INTRAVENOUS | Status: AC
  Administered 2016-04-07: 500 [IU] via INTRAVENOUS

## 2016-04-10 ENCOUNTER — Other Ambulatory Visit: Payer: Self-pay | Admitting: Family Medicine

## 2016-04-10 NOTE — H&P (Signed)
PULMONARY CONSULT NOTE  Requesting MD/Service: Grayland Ormond, MD Date of initial consultation: 03/16/16 Reason for consultation: Lung mass  PT PROFILE: 17 F smoker referred for evaluation of very large LLL mass  HPI:  15 F smoker who has experienced L sided chest discomfort for several months and has been treated for PNA in August of this year when a CXR revealed LLL opacity. She presented to the ED on 03/10/16 with severe L sided pain and CXR, CT chest were performed - these are discussed below. She saw Dr Grayland Ormond on 03/13/16 and is referred to me for diagnostic evaluation. MRI of brain has been performed and was negative. PET scan is ordered. She has been a smoker of 1/2 to 1 PPD all of her adult life until 2 weeks ago. She notes 40 pound weight loss in past year. She notes a general decline in functional status since March of this year. She had one episode of hemoptysis 3 or 4 weeks ago. She denies LE edema and calf tenderness. She has minimal cough presently.       Past Medical History:  Diagnosis Date  . 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 Cancer ([place]) 03/10/2016   lung  . Chronic kidney disease   . Collagenous colitis   . COPD (chronic obstructive pulmonary disease) ([place])   . DDD (degenerative disc disease), cervical   . Depression   . Hernia of abdominal cavity   . Hyperlipidemia   . Osteoporosis   . Pneumonia   . Stroke ([place])   . Tobacco abuse   . Urine incontinence   . Vitamin B 12 deficiency          Past Surgical History:  Procedure Laterality Date  . ABDOMINAL AORTIC ANEURYSM REPAIR    . ABDOMINAL HYSTERECTOMY    . APPENDECTOMY    . BACK SURGERY  [phone]  . CHOLECYSTECTOMY      MEDICATIONS: I have reviewed all medications and confirmed regimen as documented  Social History        Social History  . Marital status: Widowed    Spouse name: N/A  . Number of children: N/A  . Years of education: N/A      Occupational History  .  Not on file.         Social History Main Topics  . Smoking status: Former Smoker    Packs/day: 0.25    Types: Cigarettes  . Smokeless tobacco: Never Used     Comment: quit smoking 03/04/16  . Alcohol use No  . Drug use: No  . Sexual activity: No       Other Topics Concern  . Not on file      Social History Narrative  . No narrative on file         Family History  Problem Relation Age of Onset  . Diabetes Mother   . Diabetes Father     ROS: No fever, myalgias/arthralgias No new focal weakness or sensory deficits No otalgia, hearing loss, visual changes, nasal and sinus symptoms, mouth and throat problems No neck pain or adenopathy No abdominal pain, N/V/D, diarrhea, change in bowel pattern No dysuria, change in urinary pattern      Vitals:   03/16/16 1136  BP: 136/76  Pulse: 100  SpO2: 94%  Weight: 78 lb 9.6 oz (35.7 kg)  Height: '5\' 3"'$  (1.6 m)     EXAM:  Gen: Cachectic, No overt respiratory distress HEENT: NCAT, sclera white, oropharynx normal Neck: Supple without LAN, thyromegaly,  JVD Lungs: breath sounds: Full on R, diminished on L, percussion: dull in L base, No wheezes Cardiovascular: Reg, no murmurs noted Abdomen: Soft, nontender, normal BS Ext: without clubbing, cyanosis, edema Neuro: CNs grossly intact, motor and sensory intact Skin: Limited exam, no lesions noted  DATA:   BMP Latest Ref Rng & Units 03/11/2016 03/10/2016 02/21/2016  Glucose 65 - 99 mg/dL 82 92 81  BUN 6 - 20 mg/dL '20 16 10  '$ Creatinine 0.44 - 1.00 mg/dL 0.89 0.93 0.83  BUN/Creat Ratio 12 - 28 - - 12  Sodium 135 - 145 mmol/L 136 137 134  Potassium 3.5 - 5.1 mmol/L 3.9 4.5 4.7  Chloride 101 - 111 mmol/L 103 104 95(L)  CO2 22 - 32 mmol/L '24 23 22  '$ Calcium 8.9 - 10.3 mg/dL 8.9 9.0 9.1    CBC Latest Ref Rng & Units 03/11/2016 03/10/2016 02/21/2016  WBC 3.6 - 11.0 K/uL 7.3 9.4 9.9  Hemoglobin 12.0 - 16.0 g/dL 15.0 15.6 -  Hematocrit 35.0 - 47.0 % 43.3 45.6 42.5   Platelets 150 - 440 K/uL 200 209 235    CXR (03/12/16):  LLL mas with severe volume loss on L and R>L mediastinal shift CT chest (03/10/16): Left lung volume loss with large central masslike opacity involving left upper and lower lobes and left hilum, highly suspicious for bronchogenic carcinoma.  Mediastinal and right hilar lymphadenopathy   IMPRESSION:     ICD-9-CM ICD-10-CM   1. Lung mass 786.6 R91.8   2. Former smoker V15.82 Z87.891   3. Cachexia ([place]) 799.4 R64   4. 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 This is not likely resectable disease and she would be an extremely poor candidate for thoracotomy even if it were. After our discussion, she understands that the therapeutic options will likely include chemotherapy and/or XRT  PLAN:  Bronchoscopy scheduled for 03/17/16 @ 1:00 PM. I expect to see endobronchial disease and will obtain endobronchial biopsies. I have discussed with her in detail the reason for the bronchoscopy, its risks and alternatives. All questions were answered and she agrees to proceed.   I have not scheduled follow up with her but we will contact her with biopsy results and work in concert with Dr Grayland Ormond as necessary.    Merton Border, MD PCCM service Mobile [phone] Pager [phone] 03/16/2016

## 2016-04-11 NOTE — Progress Notes (Signed)
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  INTERVAL HISTORY: Patient returns to clinic today for further evaluation and to assess her toleration of cycle 1 of carboplatinum and etoposide. She continues to have shortness of breath, but otherwise feels well. She tolerated her treatment without significant side effects. She continues to have weakness and fatigue, but this is relatively unchanged. She denies any fevers or illnesses. She denies any chest pain, cough, or hemoptysis. She has no nausea, vomiting, constipation, or diarrhea. She has no urinary complaints. Patient offers no further specific complaints.  REVIEW OF SYSTEMS:   Review of Systems  Constitutional: Positive for malaise/fatigue. Negative for fever and weight loss.  Eyes: Negative.  Negative for blurred vision and double vision.  Respiratory: Positive for shortness of breath. Negative for cough and hemoptysis.   Cardiovascular: Negative.  Negative for chest pain and leg swelling.  Gastrointestinal: Negative.  Negative for abdominal pain.  Genitourinary: Negative.   Musculoskeletal: Negative.   Neurological: Positive for weakness. Negative for focal weakness and headaches.  Psychiatric/Behavioral: The patient is nervous/anxious.     As per HPI. Otherwise, a complete review of systems is negative.  PAST MEDICAL HISTORY: Past Medical History:  Diagnosis Date  . 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 Cancer ([place]) 03/10/2016   lung  . Chronic kidney disease   . Collagenous colitis   . COPD (chronic obstructive pulmonary  disease) ([place])   . DDD (degenerative disc disease), cervical   . Depression   . Hernia of abdominal cavity   . Hyperlipidemia   . Osteoporosis   . Pneumonia   . Stroke ([place])   . Tobacco abuse   . Urine incontinence   . Vitamin B 12 deficiency     PAST SURGICAL HISTORY: Past Surgical History:  Procedure Laterality Date  . ABDOMINAL AORTIC ANEURYSM REPAIR    . ABDOMINAL HYSTERECTOMY    . APPENDECTOMY    . BACK SURGERY  [phone]  . CHOLECYSTECTOMY    . FLEXIBLE BRONCHOSCOPY N/A 03/17/2016   Procedure: FLEXIBLE BRONCHOSCOPY;  Surgeon: Wilhelmina Mcardle, MD;  Location: [facility] ORS;  Service: Pulmonary;  Laterality: N/A;  . PERIPHERAL VASCULAR CATHETERIZATION N/A 04/03/2016   Procedure: Glori Luis Cath Insertion;  Surgeon: Algernon Huxley, MD;  Location: [facility];  Service: Cardiovascular;  Laterality: N/A;    FAMILY HISTORY: Family History  Problem Relation Age of Onset  . Diabetes Mother   . Diabetes Father     ADVANCED DIRECTIVES (Y/N):  N  HEALTH MAINTENANCE: Social History  Substance Use Topics  . Smoking status: Former Smoker    Packs/day: 0.25    Types: Cigarettes  . Smokeless tobacco: Never Used     Comment: quit smoking 03/04/16  . Alcohol use No     Colonoscopy:  PAP:  Bone density:  Lipid panel:  Allergies  Allergen Reactions  . Hydrocodone Itching and Rash  . Codeine Nausea And Vomiting  . Morphine And Related Nausea And Vomiting  . Ultram [Tramadol] Itching    Current Outpatient Prescriptions  Medication Sig Dispense Refill  . albuterol (PROVENTIL HFA) 108 (90 Base) MCG/ACT  inhaler Inhale 2 puffs into the lungs every 4 (four) hours as needed for wheezing or shortness of breath. 1 Inhaler 0  . atorvastatin (LIPITOR) 20 MG tablet Take 20 mg by mouth at bedtime.    . bisacodyl (DULCOLAX) 5 MG EC tablet Take 5 mg by mouth daily as needed for moderate constipation.    Marland Kitchen buPROPion (WELLBUTRIN SR) 100 MG 12 hr tablet 1 tab qAM for 3 days, then 1 tab BID,  pick a day to quit smoking in the 2nd week (Patient taking differently: Take 100 mg by mouth 2 (two) times daily. ) 60 tablet 1  . calcium carbonate (OS-CAL) 600 MG TABS tablet Take 600 mg by mouth at bedtime.     . clopidogrel (PLAVIX) 75 MG tablet Take 75 mg by mouth at bedtime.    . cyanocobalamin (,VITAMIN B-12,) 1000 MCG/ML injection Inject 1,000 mcg into the muscle every 30 (thirty) days.    Marland Kitchen levothyroxine (SYNTHROID, LEVOTHROID) 75 MCG tablet Take 1 tablet (75 mcg total) by mouth daily before breakfast. (Patient taking differently: Take 75 mcg by mouth at bedtime. ) 90 tablet 0  . lidocaine (LIDODERM) 5 % Place 1 patch onto the skin daily. Remove & Discard patch within 12 hours or as directed by MD 30 patch 0  . lidocaine-prilocaine (EMLA) cream Apply to port site 1 hour before port is accessed each time 30 g 3  . LORazepam (ATIVAN) 1 MG tablet TAKE 1/2 TABLET EVERY DAY AS NEEDED FOR ANXIETY 30 tablet 1  . mometasone (NASONEX) 50 MCG/ACT nasal spray Place 2 sprays into the nose daily as needed (for rhinitis).     Marland Kitchen omeprazole (PRILOSEC) 20 MG capsule Take 1 capsule (20 mg total) by mouth daily. Take two capsules daily for 3-4 weeks, then return to previous dose of 1 capsule daily (Patient taking differently: Take 20 mg by mouth daily as needed. For acid reflux) 30 capsule 0  . ondansetron (ZOFRAN) 8 MG tablet Take 1 tablet (8 mg total) by mouth 2 (two) times daily as needed for refractory nausea / vomiting. 30 tablet 2  . prochlorperazine (COMPAZINE) 10 MG tablet Take 1 tablet (10 mg total) by mouth every 6 (six) hours as needed (Nausea or vomiting). 30 tablet 1   No current facility-administered medications for this visit.     OBJECTIVE: Vitals:   04/12/16 1009  BP: 122/77  Pulse: 87  Resp: 18  Temp: (!) 96.6 F (35.9 C)     Body mass index is 14.1 kg/m.    ECOG FS:2 - Symptomatic, <50% confined to bed  General: Thin, no acute distress. Eyes: Pink conjunctiva, anicteric  sclera. Lungs: Clear to auscultation bilaterally. Heart: Regular rate and rhythm. No rubs, murmurs, or gallops. Abdomen: Soft, nontender, nondistended. No organomegaly noted, normoactive bowel sounds. Musculoskeletal: No edema, cyanosis, or clubbing. Neuro: Alert, answering all questions appropriately. Cranial nerves grossly intact. Skin: No rashes or petechiae noted. Psych: Normal affect.   LAB RESULTS:  Lab Results  Component Value Date   NA 134 (L) 04/12/2016   K 3.4 (L) 04/12/2016   CL 101 04/12/2016   CO2 25 04/12/2016   GLUCOSE 132 (H) 04/12/2016   BUN 18 04/12/2016   CREATININE 0.76 04/12/2016   CALCIUM 8.4 (L) 04/12/2016   PROT 6.1 (L) 04/12/2016   ALBUMIN 3.5 04/12/2016   AST 25 04/12/2016   ALT 16 04/12/2016   ALKPHOS 90 04/12/2016   BILITOT 1.0 04/12/2016   GFRNONAA >60 04/12/2016   GFRAA >  60 04/12/2016    Lab Results  Component Value Date   WBC 7.8 04/12/2016   NEUTROABS 6.7 (H) 04/12/2016   HGB 12.7 04/12/2016   HCT 37.8 04/12/2016   MCV 93.6 04/12/2016   PLT 109 (L) 04/12/2016     STUDIES: Mr Jeri Cos QQ Contrast  Result Date: 03/15/2016 CLINICAL DATA:  Initial staging of lung cancer.  Global headaches. EXAM: MRI HEAD WITHOUT AND WITH CONTRAST TECHNIQUE: Multiplanar, multiecho pulse sequences of the brain and surrounding structures were obtained without and with intravenous contrast. CONTRAST:  41m MULTIHANCE GADOBENATE DIMEGLUMINE 529 MG/ML IV SOLN COMPARISON:  Head CT 07/23/2015 FINDINGS: Calvarium and upper cervical spine: No focal marrow signal abnormality. Orbits: Bilateral cataract resection. Sinuses and Mastoids: Clear. Brain: No evidence of metastatic disease. No abnormal enhancement or edema. Advanced chronic microvascular ischemic disease in this patient with multiple vascular risk factors, with confluent ischemic gliosis throughout the cerebral white matter and patchy involvement in the pons and deep cerebellum. Prominent dilated perivascular  spaces. Normal brain volume for age. Remote hemorrhagic focus in the posterior left temporal lobe, likely incidental in isolation. No acute infarct, acute hemorrhage, hydrocephalus, or major vessel occlusion. IMPRESSION: 1. Negative for intracranial metastasis. 2. Advanced chronic microvascular disease. Electronically Signed   By: JMonte FantasiaM.D.   On: 03/15/2016 09:56   Nm Pet Image Initial (pi) Skull Base To Thigh  Result Date: 03/21/2016 CLINICAL DATA:  Initial treatment strategy for suspected primary bronchogenic carcinoma of the central left lung, status post bronchoscopic biopsy 03/17/2016 with pathology pending. EXAM: NUCLEAR MEDICINE PET SKULL BASE TO THIGH TECHNIQUE: 12.3 mCi F-18 FDG was injected intravenously. Full-ring PET imaging was performed from the skull base to thigh after the radiotracer. CT data was obtained and used for attenuation correction and anatomic localization. FASTING BLOOD GLUCOSE:  Value: 81 mg/dl COMPARISON:  03/10/2016 chest CT angiogram. 01/07/2016 CT abdomen/pelvis. FINDINGS: NECK Hypermetabolic 0.7 cm right supraclavicular/ level 4 neck node with max SUV 7.4 (series 4/ image 49). No additional hypermetabolic lymph nodes in the neck. CHEST Large irregular infiltrative central left lung mass is intensely hypermetabolic with max SUV 276.1and measures 6.6 x 6.2 cm in maximum axial dimensions (series 4/image 82), consistent with a primary bronchogenic carcinoma, extending throughout the left lower lobe and central left upper lobe and confluent with hypermetabolic left hilar adenopathy. There is high-grade narrowing of the left upper and left lower lobar bronchi and moderate narrowing of the left mainstem bronchus. There are patchy secretions in the central airways. Trace left pleural effusion. Hypermetabolic 0.6 cm peribronchial central right middle lobe nodule with max SUV 4.0. Hypermetabolic subcarinal adenopathy measuring up to 2.2 cm with max SUV 14.7 (series 4/ image  92), which is confluent with the central left lung mass. Hypermetabolic AP window adenopathy measuring up to 1.7 cm with max SUV 17.4 (series 4/image 77), which is confluent with the central left lung mass. Hypermetabolic bilateral lower paratracheal adenopathy encasing the carina and lower trachea measuring up to 2.2 cm thickness with max SUV 18.4 (series 4/image 79), which is confluent with the central left lung mass. Hypermetabolic right lower paraesophageal adenopathy measuring up to the 1.5 cm with max SUV 13.1 (series 4/image 123). Hypermetabolic high right mediastinal 1.0 cm lymph node between the right subclavian artery and upper thoracic esophagus (series 4/image 53) with max SUV 6.3. Hypermetabolic high left paratracheal 0.8 cm node with max SUV 8.1 (series 4/image 60). Trace pericardial effusion/thickening. Left main, left anterior descending, left circumflex and  right coronary atherosclerosis. No right pleural effusion. Stable volume loss in the left lung. ABDOMEN/PELVIS No abnormal hypermetabolic activity within the liver, pancreas, adrenal glands, or spleen. No hypermetabolic lymph nodes in the abdomen or pelvis. Cholecystectomy. Abdominal aortic atherosclerosis. Status post surgical aorto bi-iliac graft. SKELETON Focal hypermetabolism in the right sacrum adjacent to the right sacroiliac joint with max SUV 7.4, most consistent with osseous metastasis, which appears occult on the CT images. No additional foci of skeletal hypermetabolism. Vertebroplasty changes in the T11 and L3 vertebral bodies. IMPRESSION: 1. Intensely hypermetabolic (max SUV 37.5) large irregular infiltrative central left lung mass, consistent with primary bronchogenic carcinoma, which is confluent with hypermetabolic left hilar, subcarinal, AP window and bilateral lower paratracheal nodal metastatic disease, which encases the carina and lower trachea. Stable volume loss in the left lung with high-grade narrowing of the left upper and  left lower lobe bronchi and moderate narrowing of the left mainstem bronchus. 2. Hypermetabolic nodal metastases in the right supraclavicular neck, bilateral high mediastinum and right lower paraesophageal mediastinum. 3. Hypermetabolic peribronchial pulmonary metastasis in the central right middle lobe. 4. Trace left pleural effusion, probably malignant. 5. Hypermetabolic right sacral bone metastasis. 6. Additional findings include aortic atherosclerosis, left main and 3 vessel coronary atherosclerosis and trace pericardial effusion. Electronically Signed   By: Ilona Sorrel M.D.   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 Patient noted to have bilateral disease on bronchoscopy. PET scan results reviewed independently and reported as above with bilateral lung metastasis as well as a hypermetabolic right sacral bone metastasis. MRI the brain is negative for intracranial metastasis. Despite patient's decreased performance status, she wishes to attempt aggressive treatment with chemotherapy. Patient received cycle 1, day 1 of carboplatinum and etoposide last week and tolerated her treatment well. Return to clinic in 2 weeks for laboratory work and further evaluation and consideration of cycle 2.  2. Headaches: MRI negative for metastasis, monitor. 3. Pain: Patient does not complain of pain today. Monitor. 4. Weight loss: Likely multifactorial including possible underlying malignancy. Consider Megace in the future. 5. Thrombocytopenia: Secondary chemotherapy, monitor.   Patient expressed understanding and was in agreement with this plan. She also understands that She can call clinic at any time with any questions, concerns, or complaints.   Primary cancer of left lung metastatic to other site [facility])   Staging form: Lung, AJCC  7th Edition   - Clinical stage from 03/12/2016: Stage IV (T3, N3, M1a) - Signed by Lloyd Huger, MD on 03/26/2016  Lloyd Huger, MD   04/12/2016 12:27 PM

## 2016-04-12 ENCOUNTER — Ambulatory Visit: Payer: Commercial Managed Care - HMO | Admitting: Oncology

## 2016-04-12 ENCOUNTER — Inpatient Hospital Stay (HOSPITAL_BASED_OUTPATIENT_CLINIC_OR_DEPARTMENT_OTHER): Payer: Commercial Managed Care - HMO | Admitting: Oncology

## 2016-04-12 ENCOUNTER — Other Ambulatory Visit: Payer: Commercial Managed Care - HMO

## 2016-04-12 ENCOUNTER — Inpatient Hospital Stay: Payer: Commercial Managed Care - HMO

## 2016-04-12 VITALS — BP 122/77 | HR 87 | Temp 96.6°F | Resp 18 | Wt 79.6 lb

## 2016-04-12 DIAGNOSIS — R531 Weakness: Secondary | ICD-10-CM

## 2016-04-12 DIAGNOSIS — C3402 Malignant neoplasm of left main bronchus: Secondary | ICD-10-CM | POA: Diagnosis not present

## 2016-04-12 DIAGNOSIS — Z5111 Encounter for antineoplastic chemotherapy: Secondary | ICD-10-CM | POA: Diagnosis not present

## 2016-04-12 DIAGNOSIS — R51 Headache: Secondary | ICD-10-CM

## 2016-04-12 DIAGNOSIS — F329 Major depressive disorder, single episode, unspecified: Secondary | ICD-10-CM

## 2016-04-12 DIAGNOSIS — C3492 Malignant neoplasm of unspecified part of left bronchus or lung: Secondary | ICD-10-CM

## 2016-04-12 DIAGNOSIS — Z8673 Personal history of transient ischemic attack (TIA), and cerebral infarction without residual deficits: Secondary | ICD-10-CM

## 2016-04-12 DIAGNOSIS — N189 Chronic kidney disease, unspecified: Secondary | ICD-10-CM

## 2016-04-12 DIAGNOSIS — M5030 Other cervical disc degeneration, unspecified cervical region: Secondary | ICD-10-CM

## 2016-04-12 DIAGNOSIS — C7802 Secondary malignant neoplasm of left lung: Secondary | ICD-10-CM | POA: Diagnosis not present

## 2016-04-12 DIAGNOSIS — C7801 Secondary malignant neoplasm of right lung: Secondary | ICD-10-CM | POA: Diagnosis not present

## 2016-04-12 DIAGNOSIS — C7951 Secondary malignant neoplasm of bone: Secondary | ICD-10-CM

## 2016-04-12 DIAGNOSIS — I129 Hypertensive chronic kidney disease with stage 1 through stage 4 chronic kidney disease, or unspecified chronic kidney disease: Secondary | ICD-10-CM

## 2016-04-12 DIAGNOSIS — E785 Hyperlipidemia, unspecified: Secondary | ICD-10-CM

## 2016-04-12 DIAGNOSIS — R634 Abnormal weight loss: Secondary | ICD-10-CM

## 2016-04-12 DIAGNOSIS — R590 Localized enlarged lymph nodes: Secondary | ICD-10-CM

## 2016-04-12 DIAGNOSIS — I714 Abdominal aortic aneurysm, without rupture: Secondary | ICD-10-CM

## 2016-04-12 DIAGNOSIS — F419 Anxiety disorder, unspecified: Secondary | ICD-10-CM

## 2016-04-12 DIAGNOSIS — R5383 Other fatigue: Secondary | ICD-10-CM | POA: Diagnosis not present

## 2016-04-12 DIAGNOSIS — Z8701 Personal history of pneumonia (recurrent): Secondary | ICD-10-CM

## 2016-04-12 DIAGNOSIS — Z79899 Other long term (current) drug therapy: Secondary | ICD-10-CM

## 2016-04-12 DIAGNOSIS — K589 Irritable bowel syndrome without diarrhea: Secondary | ICD-10-CM

## 2016-04-12 DIAGNOSIS — K469 Unspecified abdominal hernia without obstruction or gangrene: Secondary | ICD-10-CM

## 2016-04-12 DIAGNOSIS — I712 Thoracic aortic aneurysm, without rupture: Secondary | ICD-10-CM

## 2016-04-12 DIAGNOSIS — M818 Other osteoporosis without current pathological fracture: Secondary | ICD-10-CM

## 2016-04-12 DIAGNOSIS — E538 Deficiency of other specified B group vitamins: Secondary | ICD-10-CM

## 2016-04-12 DIAGNOSIS — J449 Chronic obstructive pulmonary disease, unspecified: Secondary | ICD-10-CM

## 2016-04-12 DIAGNOSIS — M546 Pain in thoracic spine: Secondary | ICD-10-CM

## 2016-04-12 DIAGNOSIS — I2510 Atherosclerotic heart disease of native coronary artery without angina pectoris: Secondary | ICD-10-CM

## 2016-04-12 DIAGNOSIS — D696 Thrombocytopenia, unspecified: Secondary | ICD-10-CM | POA: Diagnosis not present

## 2016-04-12 DIAGNOSIS — F17210 Nicotine dependence, cigarettes, uncomplicated: Secondary | ICD-10-CM

## 2016-04-12 DIAGNOSIS — J90 Pleural effusion, not elsewhere classified: Secondary | ICD-10-CM

## 2016-04-12 DIAGNOSIS — R32 Unspecified urinary incontinence: Secondary | ICD-10-CM

## 2016-04-12 LAB — CBC WITH DIFFERENTIAL/PLATELET
BASOS ABS: 0 10*3/uL (ref 0–0.1)
Basophils Relative: 1 %
EOS PCT: 1 %
Eosinophils Absolute: 0.1 10*3/uL (ref 0–0.7)
HCT: 37.8 % (ref 35.0–47.0)
Hemoglobin: 12.7 g/dL (ref 12.0–16.0)
LYMPHS PCT: 11 %
Lymphs Abs: 0.8 10*3/uL — ABNORMAL LOW (ref 1.0–3.6)
MCH: 31.5 pg (ref 26.0–34.0)
MCHC: 33.7 g/dL (ref 32.0–36.0)
MCV: 93.6 fL (ref 80.0–100.0)
MONO ABS: 0.2 10*3/uL (ref 0.2–0.9)
Monocytes Relative: 2 %
Neutro Abs: 6.7 10*3/uL — ABNORMAL HIGH (ref 1.4–6.5)
Neutrophils Relative %: 85 %
PLATELETS: 109 10*3/uL — AB (ref 150–440)
RBC: 4.04 MIL/uL (ref 3.80–5.20)
RDW: 14 % (ref 11.5–14.5)
WBC: 7.8 10*3/uL (ref 3.6–11.0)

## 2016-04-12 LAB — COMPREHENSIVE METABOLIC PANEL
ALBUMIN: 3.5 g/dL (ref 3.5–5.0)
ALT: 16 U/L (ref 14–54)
ANION GAP: 8 (ref 5–15)
AST: 25 U/L (ref 15–41)
Alkaline Phosphatase: 90 U/L (ref 38–126)
BILIRUBIN TOTAL: 1 mg/dL (ref 0.3–1.2)
BUN: 18 mg/dL (ref 6–20)
CHLORIDE: 101 mmol/L (ref 101–111)
CO2: 25 mmol/L (ref 22–32)
Calcium: 8.4 mg/dL — ABNORMAL LOW (ref 8.9–10.3)
Creatinine, Ser: 0.76 mg/dL (ref 0.44–1.00)
GFR calc Af Amer: >60 mL/min (ref >60)
GFR calc non Af Amer: >60 mL/min (ref >60)
GLUCOSE: 132 mg/dL — AB (ref 65–99)
POTASSIUM: 3.4 mmol/L — AB (ref 3.5–5.1)
SODIUM: 134 mmol/L — AB (ref 135–145)
TOTAL PROTEIN: 6.1 g/dL — AB (ref 6.5–8.1)

## 2016-04-15 ENCOUNTER — Other Ambulatory Visit: Payer: Self-pay | Admitting: Family Medicine

## 2016-04-25 NOTE — Progress Notes (Signed)
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  INTERVAL HISTORY: Patient returns to clinic today for further evaluation and consideration of cycle 2 of carboplatinum and etoposide. She continues to have shortness of breath, but otherwise feels well. She tolerated her first infusion well without significant side effects. She continues to have weakness and fatigue, but this is relatively unchanged. She denies any fevers or illnesses. She denies any chest pain, cough, or hemoptysis. She has no nausea, vomiting, constipation, or diarrhea. She has no urinary complaints. Patient offers no further specific complaints.  REVIEW OF SYSTEMS:   Review of Systems  Constitutional: Positive for malaise/fatigue. Negative for fever and weight loss.  Eyes: Negative.  Negative for blurred vision and double vision.  Respiratory: Positive for shortness of breath. Negative for cough and hemoptysis.   Cardiovascular: Negative.  Negative for chest pain and leg swelling.  Gastrointestinal: Negative.  Negative for abdominal pain.  Genitourinary: Negative.   Musculoskeletal: Negative.   Neurological: Positive for weakness. Negative for focal weakness and headaches.  Psychiatric/Behavioral: The patient is nervous/anxious.     As per HPI. Otherwise, a complete review of systems is negative.  PAST MEDICAL HISTORY: Past Medical History:  Diagnosis Date  . 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 Cancer ([place]) 03/10/2016   lung  . Chronic kidney disease   . Collagenous colitis   . COPD (chronic obstructive pulmonary  disease) ([place])   . DDD (degenerative disc disease), cervical   . Depression   . Hernia of abdominal cavity   . Hyperlipidemia   . Osteoporosis   . Pneumonia   . Stroke ([place])   . Tobacco abuse   . Urine incontinence   . Vitamin B 12 deficiency     PAST SURGICAL HISTORY: Past Surgical History:  Procedure Laterality Date  . ABDOMINAL AORTIC ANEURYSM REPAIR    . ABDOMINAL HYSTERECTOMY    . APPENDECTOMY    . BACK SURGERY  [phone]  . CHOLECYSTECTOMY    . FLEXIBLE BRONCHOSCOPY N/A 03/17/2016   Procedure: FLEXIBLE BRONCHOSCOPY;  Surgeon: Wilhelmina Mcardle, MD;  Location: [facility] ORS;  Service: Pulmonary;  Laterality: N/A;  . PERIPHERAL VASCULAR CATHETERIZATION N/A 04/03/2016   Procedure: Glori Luis Cath Insertion;  Surgeon: Algernon Huxley, MD;  Location: [place] [facility];  Service: Cardiovascular;  Laterality: N/A;    FAMILY HISTORY: Family History  Problem Relation Age of Onset  . Diabetes Mother   . Diabetes Father     ADVANCED DIRECTIVES (Y/N):  N  HEALTH MAINTENANCE: Social History  Substance Use Topics  . Smoking status: Former Smoker    Packs/day: 0.25    Types: Cigarettes  . Smokeless tobacco: Never Used     Comment: quit smoking 03/04/16  . Alcohol use No     Colonoscopy:  PAP:  Bone density:  Lipid panel:  Allergies  Allergen Reactions  . Hydrocodone Itching and Rash  . Codeine Nausea And Vomiting  . Morphine And Related Nausea And Vomiting  . Ultram [Tramadol] Itching    Current Outpatient Prescriptions  Medication Sig Dispense Refill  . albuterol (PROVENTIL HFA) 108 (90 Base) MCG/ACT inhaler  Inhale 2 puffs into the lungs every 4 (four) hours as needed for wheezing or shortness of breath. 1 Inhaler 0  . atorvastatin (LIPITOR) 20 MG tablet Take 20 mg by mouth at bedtime.    . bisacodyl (DULCOLAX) 5 MG EC tablet Take 5 mg by mouth daily as needed for moderate constipation.    Marland Kitchen buPROPion (WELLBUTRIN SR) 100 MG 12 hr tablet 1 TAB IN AM FOR 3 DAYS, THEN 1 TAB 2X ,  PICK A DAY TO QUIT SMOKING IN THE 2ND WEEK 60 tablet 1  . calcium carbonate (OS-CAL) 600 MG TABS tablet Take 600 mg by mouth at bedtime.     . clopidogrel (PLAVIX) 75 MG tablet Take 75 mg by mouth at bedtime.    . cyanocobalamin (,VITAMIN B-12,) 1000 MCG/ML injection Inject 1,000 mcg into the muscle every 30 (thirty) days.    Marland Kitchen levothyroxine (SYNTHROID, LEVOTHROID) 75 MCG tablet Take 1 tablet (75 mcg total) by mouth daily before breakfast. (Patient taking differently: Take 75 mcg by mouth at bedtime. ) 90 tablet 0  . lidocaine (LIDODERM) 5 % Place 1 patch onto the skin daily. Remove & Discard patch within 12 hours or as directed by MD 30 patch 0  . lidocaine-prilocaine (EMLA) cream Apply to port site 1 hour before port is accessed each time 30 g 3  . LORazepam (ATIVAN) 1 MG tablet TAKE 1/2 TABLET EVERY DAY AS NEEDED FOR ANXIETY 30 tablet 1  . mometasone (NASONEX) 50 MCG/ACT nasal spray Place 2 sprays into the nose daily as needed (for rhinitis).     Marland Kitchen omeprazole (PRILOSEC) 20 MG capsule Take 1 capsule (20 mg total) by mouth daily. Take two capsules daily for 3-4 weeks, then return to previous dose of 1 capsule daily (Patient taking differently: Take 20 mg by mouth daily as needed. For acid reflux) 30 capsule 0  . ondansetron (ZOFRAN) 8 MG tablet Take 1 tablet (8 mg total) by mouth 2 (two) times daily as needed for refractory nausea / vomiting. 30 tablet 2  . prochlorperazine (COMPAZINE) 10 MG tablet Take 1 tablet (10 mg total) by mouth every 6 (six) hours as needed (Nausea or vomiting). 30 tablet 1  . omeprazole (PRILOSEC) 20 MG capsule TAKE 1 CAPSULE (20 MG TOTAL) BY MOUTH DAILY. 30 capsule 3   No current facility-administered medications for this visit.    Facility-Administered Medications Ordered in Other Visits  Medication Dose Route Frequency Provider Last Rate Last Dose  . sodium chloride flush (NS) 0.9 % injection 10 mL  10 mL Intracatheter PRN Lloyd Huger, MD   10 mL at 04/28/16 1359     OBJECTIVE: Vitals:   04/26/16 0935  BP: (!) 145/84  Pulse: 89  Resp: 18  Temp: (!) 95.6 F (35.3 C)     Body mass index is 14.02 kg/m.    ECOG FS:2 - Symptomatic, <50% confined to bed  General: Thin, no acute distress. Eyes: Pink conjunctiva, anicteric sclera. Lungs: Clear to auscultation bilaterally. Heart: Regular rate and rhythm. No rubs, murmurs, or gallops. Abdomen: Soft, nontender, nondistended. No organomegaly noted, normoactive bowel sounds. Musculoskeletal: No edema, cyanosis, or clubbing. Neuro: Alert, answering all questions appropriately. Cranial nerves grossly intact. Skin: No rashes or petechiae noted. Psych: Normal affect.   LAB RESULTS:  Lab Results  Component Value Date   NA 137 04/26/2016   K 3.2 (L) 04/26/2016   CL 106 04/26/2016   CO2 24 04/26/2016   GLUCOSE 110 (H) 04/26/2016   BUN  11 04/26/2016   CREATININE 0.83 04/26/2016   CALCIUM 8.3 (L) 04/26/2016   PROT 6.4 (L) 04/26/2016   ALBUMIN 3.5 04/26/2016   AST 30 04/26/2016   ALT 21 04/26/2016   ALKPHOS 128 (H) 04/26/2016   BILITOT 0.2 (L) 04/26/2016   GFRNONAA >60 04/26/2016   GFRAA >60 04/26/2016    Lab Results  Component Value Date   WBC 22.0 (H) 04/26/2016   NEUTROABS 19.4 (H) 04/26/2016   HGB 11.6 (L) 04/26/2016   HCT 34.5 (L) 04/26/2016   MCV 93.1 04/26/2016   PLT 335 04/26/2016     STUDIES: No results found.  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 Patient noted to have bilateral disease on bronchoscopy. PET scan results reviewed independently and reported as above with bilateral lung metastasis as well as a hypermetabolic right sacral bone metastasis. MRI the brain is negative for intracranial metastasis. Despite patient's decreased performance status, she wishes to attempt aggressive treatment with chemotherapy.  Proceed with cycle 2, day 1 of carboplatinum and etoposide today. Return to clinic in 1 and 2 days for etoposide only and then in 3 weeks for consideration of cycle 3. Plan to reimage at the conclusion of cycle 4.  2. Headaches: MRI negative for metastasis, monitor. 3. Pain: Patient does not complain of pain today. Monitor. 4. Weight loss: Likely multifactorial including possible underlying malignancy. Consider Megace in the future. 5. Thrombocytopenia: Resolved. Secondary chemotherapy, monitor.  6. Leukocytosis: Likely secondary to Neulasta, monitor.   Patient expressed understanding and was in agreement with this plan. She also understands that She can call clinic at any time with any questions, concerns, or complaints.   Primary cancer of left lung metastatic to other site [facility])   Staging form: Lung, AJCC 7th Edition   - Clinical stage from 03/12/2016: Stage IV (T3, N3, M1a) - Signed by Lloyd Huger, MD on 03/26/2016  Lloyd Huger, MD   04/30/2016 11:21 AM

## 2016-04-26 ENCOUNTER — Inpatient Hospital Stay (HOSPITAL_BASED_OUTPATIENT_CLINIC_OR_DEPARTMENT_OTHER): Payer: Commercial Managed Care - HMO | Admitting: Oncology

## 2016-04-26 ENCOUNTER — Inpatient Hospital Stay: Payer: Commercial Managed Care - HMO

## 2016-04-26 ENCOUNTER — Inpatient Hospital Stay: Payer: Commercial Managed Care - HMO | Admitting: *Deleted

## 2016-04-26 VITALS — BP 145/84 | HR 89 | Temp 95.6°F | Resp 18 | Wt 79.1 lb

## 2016-04-26 DIAGNOSIS — I2510 Atherosclerotic heart disease of native coronary artery without angina pectoris: Secondary | ICD-10-CM

## 2016-04-26 DIAGNOSIS — C7802 Secondary malignant neoplasm of left lung: Secondary | ICD-10-CM

## 2016-04-26 DIAGNOSIS — C3492 Malignant neoplasm of unspecified part of left bronchus or lung: Secondary | ICD-10-CM

## 2016-04-26 DIAGNOSIS — R32 Unspecified urinary incontinence: Secondary | ICD-10-CM

## 2016-04-26 DIAGNOSIS — Z5111 Encounter for antineoplastic chemotherapy: Secondary | ICD-10-CM | POA: Diagnosis not present

## 2016-04-26 DIAGNOSIS — R51 Headache: Secondary | ICD-10-CM | POA: Diagnosis not present

## 2016-04-26 DIAGNOSIS — R634 Abnormal weight loss: Secondary | ICD-10-CM

## 2016-04-26 DIAGNOSIS — M546 Pain in thoracic spine: Secondary | ICD-10-CM

## 2016-04-26 DIAGNOSIS — R590 Localized enlarged lymph nodes: Secondary | ICD-10-CM

## 2016-04-26 DIAGNOSIS — N189 Chronic kidney disease, unspecified: Secondary | ICD-10-CM

## 2016-04-26 DIAGNOSIS — D72819 Decreased white blood cell count, unspecified: Secondary | ICD-10-CM

## 2016-04-26 DIAGNOSIS — E785 Hyperlipidemia, unspecified: Secondary | ICD-10-CM

## 2016-04-26 DIAGNOSIS — E538 Deficiency of other specified B group vitamins: Secondary | ICD-10-CM

## 2016-04-26 DIAGNOSIS — C7801 Secondary malignant neoplasm of right lung: Secondary | ICD-10-CM | POA: Diagnosis not present

## 2016-04-26 DIAGNOSIS — K469 Unspecified abdominal hernia without obstruction or gangrene: Secondary | ICD-10-CM

## 2016-04-26 DIAGNOSIS — F419 Anxiety disorder, unspecified: Secondary | ICD-10-CM

## 2016-04-26 DIAGNOSIS — K589 Irritable bowel syndrome without diarrhea: Secondary | ICD-10-CM

## 2016-04-26 DIAGNOSIS — M818 Other osteoporosis without current pathological fracture: Secondary | ICD-10-CM

## 2016-04-26 DIAGNOSIS — D696 Thrombocytopenia, unspecified: Secondary | ICD-10-CM

## 2016-04-26 DIAGNOSIS — M5030 Other cervical disc degeneration, unspecified cervical region: Secondary | ICD-10-CM

## 2016-04-26 DIAGNOSIS — C3402 Malignant neoplasm of left main bronchus: Secondary | ICD-10-CM

## 2016-04-26 DIAGNOSIS — Z8701 Personal history of pneumonia (recurrent): Secondary | ICD-10-CM

## 2016-04-26 DIAGNOSIS — R531 Weakness: Secondary | ICD-10-CM

## 2016-04-26 DIAGNOSIS — I129 Hypertensive chronic kidney disease with stage 1 through stage 4 chronic kidney disease, or unspecified chronic kidney disease: Secondary | ICD-10-CM

## 2016-04-26 DIAGNOSIS — J90 Pleural effusion, not elsewhere classified: Secondary | ICD-10-CM

## 2016-04-26 DIAGNOSIS — C7951 Secondary malignant neoplasm of bone: Secondary | ICD-10-CM

## 2016-04-26 DIAGNOSIS — R5383 Other fatigue: Secondary | ICD-10-CM | POA: Diagnosis not present

## 2016-04-26 DIAGNOSIS — J449 Chronic obstructive pulmonary disease, unspecified: Secondary | ICD-10-CM

## 2016-04-26 DIAGNOSIS — I712 Thoracic aortic aneurysm, without rupture: Secondary | ICD-10-CM

## 2016-04-26 DIAGNOSIS — F17210 Nicotine dependence, cigarettes, uncomplicated: Secondary | ICD-10-CM

## 2016-04-26 DIAGNOSIS — Z79899 Other long term (current) drug therapy: Secondary | ICD-10-CM

## 2016-04-26 DIAGNOSIS — I714 Abdominal aortic aneurysm, without rupture: Secondary | ICD-10-CM

## 2016-04-26 DIAGNOSIS — F329 Major depressive disorder, single episode, unspecified: Secondary | ICD-10-CM

## 2016-04-26 DIAGNOSIS — Z8673 Personal history of transient ischemic attack (TIA), and cerebral infarction without residual deficits: Secondary | ICD-10-CM

## 2016-04-26 LAB — COMPREHENSIVE METABOLIC PANEL
ALT: 21 U/L (ref 14–54)
ANION GAP: 7 (ref 5–15)
AST: 30 U/L (ref 15–41)
Albumin: 3.5 g/dL (ref 3.5–5.0)
Alkaline Phosphatase: 128 U/L — ABNORMAL HIGH (ref 38–126)
BUN: 11 mg/dL (ref 6–20)
CALCIUM: 8.3 mg/dL — AB (ref 8.9–10.3)
CHLORIDE: 106 mmol/L (ref 101–111)
CO2: 24 mmol/L (ref 22–32)
CREATININE: 0.83 mg/dL (ref 0.44–1.00)
Glucose, Bld: 110 mg/dL — ABNORMAL HIGH (ref 65–99)
Potassium: 3.2 mmol/L — ABNORMAL LOW (ref 3.5–5.1)
Sodium: 137 mmol/L (ref 135–145)
Total Bilirubin: 0.2 mg/dL — ABNORMAL LOW (ref 0.3–1.2)
Total Protein: 6.4 g/dL — ABNORMAL LOW (ref 6.5–8.1)

## 2016-04-26 LAB — CBC WITH DIFFERENTIAL/PLATELET
Basophils Absolute: 0.1 10*3/uL (ref 0–0.1)
Basophils Relative: 0 %
EOS PCT: 0 %
Eosinophils Absolute: 0 10*3/uL (ref 0–0.7)
HCT: 34.5 % — ABNORMAL LOW (ref 35.0–47.0)
Hemoglobin: 11.6 g/dL — ABNORMAL LOW (ref 12.0–16.0)
LYMPHS ABS: 1.2 10*3/uL (ref 1.0–3.6)
LYMPHS PCT: 5 %
MCH: 31.3 pg (ref 26.0–34.0)
MCHC: 33.6 g/dL (ref 32.0–36.0)
MCV: 93.1 fL (ref 80.0–100.0)
MONO ABS: 1.3 10*3/uL — AB (ref 0.2–0.9)
MONOS PCT: 6 %
Neutro Abs: 19.4 10*3/uL — ABNORMAL HIGH (ref 1.4–6.5)
Neutrophils Relative %: 89 %
PLATELETS: 335 10*3/uL (ref 150–440)
RBC: 3.7 MIL/uL — AB (ref 3.80–5.20)
RDW: 15.5 % — ABNORMAL HIGH (ref 11.5–14.5)
WBC: 22 10*3/uL — ABNORMAL HIGH (ref 3.6–11.0)

## 2016-04-26 MED ORDER — SODIUM CHLORIDE 0.9 % IJ SOLN
10.0000 mL | Freq: Once | INTRAMUSCULAR | Status: AC
  Administered 2016-04-26: 10 mL via INTRAVENOUS
  Filled 2016-04-26: qty 10

## 2016-04-26 MED ORDER — SODIUM CHLORIDE 0.9 % IV SOLN
Freq: Once | INTRAVENOUS | Status: AC
  Administered 2016-04-26: 11:00:00 via INTRAVENOUS
  Filled 2016-04-26: qty 1000

## 2016-04-26 MED ORDER — HEPARIN SOD (PORK) LOCK FLUSH 100 UNIT/ML IV SOLN
500.0000 [IU] | Freq: Once | INTRAVENOUS | Status: AC
  Administered 2016-04-26: 500 [IU] via INTRAVENOUS

## 2016-04-26 MED ORDER — DEXAMETHASONE SODIUM PHOSPHATE 10 MG/ML IJ SOLN
10.0000 mg | Freq: Once | INTRAMUSCULAR | Status: AC
  Administered 2016-04-26: 10 mg via INTRAVENOUS
  Filled 2016-04-26: qty 1

## 2016-04-26 MED ORDER — PALONOSETRON HCL INJECTION 0.25 MG/5ML
0.2500 mg | Freq: Once | INTRAVENOUS | Status: AC
  Administered 2016-04-26: 0.25 mg via INTRAVENOUS
  Filled 2016-04-26: qty 5

## 2016-04-26 MED ORDER — SODIUM CHLORIDE 0.9 % IV SOLN
80.0000 mg/m2 | Freq: Once | INTRAVENOUS | Status: AC
  Administered 2016-04-26: 100 mg via INTRAVENOUS
  Filled 2016-04-26: qty 5

## 2016-04-26 MED ORDER — SODIUM CHLORIDE 0.9 % IV SOLN: 10.0000 mg | Freq: Once | INTRAVENOUS | Status: DC

## 2016-04-26 MED ORDER — SODIUM CHLORIDE 0.9 % IV SOLN
261.0000 mg | Freq: Once | INTRAVENOUS | Status: AC
  Administered 2016-04-26: 260 mg via INTRAVENOUS
  Filled 2016-04-26: qty 26

## 2016-04-26 NOTE — Progress Notes (Signed)
States is having pain in left side of rib cage that extends to lower back.

## 2016-04-27 ENCOUNTER — Inpatient Hospital Stay: Payer: Commercial Managed Care - HMO

## 2016-04-27 VITALS — BP 128/82 | HR 81 | Temp 96.0°F | Resp 18

## 2016-04-27 DIAGNOSIS — D696 Thrombocytopenia, unspecified: Secondary | ICD-10-CM | POA: Diagnosis not present

## 2016-04-27 DIAGNOSIS — C3492 Malignant neoplasm of unspecified part of left bronchus or lung: Secondary | ICD-10-CM

## 2016-04-27 DIAGNOSIS — R5383 Other fatigue: Secondary | ICD-10-CM | POA: Diagnosis not present

## 2016-04-27 DIAGNOSIS — C3402 Malignant neoplasm of left main bronchus: Secondary | ICD-10-CM | POA: Diagnosis not present

## 2016-04-27 DIAGNOSIS — C7951 Secondary malignant neoplasm of bone: Secondary | ICD-10-CM | POA: Diagnosis not present

## 2016-04-27 DIAGNOSIS — R51 Headache: Secondary | ICD-10-CM | POA: Diagnosis not present

## 2016-04-27 DIAGNOSIS — C7802 Secondary malignant neoplasm of left lung: Secondary | ICD-10-CM | POA: Diagnosis not present

## 2016-04-27 DIAGNOSIS — R634 Abnormal weight loss: Secondary | ICD-10-CM | POA: Diagnosis not present

## 2016-04-27 DIAGNOSIS — C7801 Secondary malignant neoplasm of right lung: Secondary | ICD-10-CM | POA: Diagnosis not present

## 2016-04-27 DIAGNOSIS — Z5111 Encounter for antineoplastic chemotherapy: Secondary | ICD-10-CM | POA: Diagnosis not present

## 2016-04-27 MED ORDER — SODIUM CHLORIDE 0.9 % IV SOLN
Freq: Once | INTRAVENOUS | Status: AC
  Administered 2016-04-27: 14:00:00 via INTRAVENOUS
  Filled 2016-04-27: qty 1000

## 2016-04-27 MED ORDER — HEPARIN SOD (PORK) LOCK FLUSH 100 UNIT/ML IV SOLN
500.0000 [IU] | Freq: Once | INTRAVENOUS | Status: AC | PRN
  Administered 2016-04-27: 500 [IU]
  Filled 2016-04-27: qty 5

## 2016-04-27 MED ORDER — SODIUM CHLORIDE 0.9 % IV SOLN
80.0000 mg/m2 | Freq: Once | INTRAVENOUS | Status: AC
  Administered 2016-04-27: 100 mg via INTRAVENOUS
  Filled 2016-04-27: qty 5

## 2016-04-27 MED ORDER — DEXAMETHASONE SODIUM PHOSPHATE 10 MG/ML IJ SOLN
10.0000 mg | Freq: Once | INTRAMUSCULAR | Status: AC
  Administered 2016-04-27: 10 mg via INTRAVENOUS
  Filled 2016-04-27: qty 1

## 2016-04-28 ENCOUNTER — Other Ambulatory Visit: Payer: Self-pay | Admitting: Family Medicine

## 2016-04-28 ENCOUNTER — Inpatient Hospital Stay: Payer: Commercial Managed Care - HMO

## 2016-04-28 VITALS — BP 116/67 | HR 83 | Temp 96.9°F | Resp 18

## 2016-04-28 DIAGNOSIS — C7801 Secondary malignant neoplasm of right lung: Secondary | ICD-10-CM | POA: Diagnosis not present

## 2016-04-28 DIAGNOSIS — R5383 Other fatigue: Secondary | ICD-10-CM | POA: Diagnosis not present

## 2016-04-28 DIAGNOSIS — C3402 Malignant neoplasm of left main bronchus: Secondary | ICD-10-CM | POA: Diagnosis not present

## 2016-04-28 DIAGNOSIS — C7951 Secondary malignant neoplasm of bone: Secondary | ICD-10-CM | POA: Diagnosis not present

## 2016-04-28 DIAGNOSIS — C7802 Secondary malignant neoplasm of left lung: Secondary | ICD-10-CM | POA: Diagnosis not present

## 2016-04-28 DIAGNOSIS — R634 Abnormal weight loss: Secondary | ICD-10-CM | POA: Diagnosis not present

## 2016-04-28 DIAGNOSIS — C3492 Malignant neoplasm of unspecified part of left bronchus or lung: Secondary | ICD-10-CM

## 2016-04-28 DIAGNOSIS — D696 Thrombocytopenia, unspecified: Secondary | ICD-10-CM | POA: Diagnosis not present

## 2016-04-28 DIAGNOSIS — R51 Headache: Secondary | ICD-10-CM | POA: Diagnosis not present

## 2016-04-28 DIAGNOSIS — Z5111 Encounter for antineoplastic chemotherapy: Secondary | ICD-10-CM | POA: Diagnosis not present

## 2016-04-28 MED ORDER — SODIUM CHLORIDE 0.9% FLUSH
10.0000 mL | INTRAVENOUS | Status: DC | PRN
  Administered 2016-04-28: 10 mL
  Filled 2016-04-28: qty 10

## 2016-04-28 MED ORDER — DEXAMETHASONE SODIUM PHOSPHATE 10 MG/ML IJ SOLN
10.0000 mg | Freq: Once | INTRAMUSCULAR | Status: AC
  Administered 2016-04-28: 10 mg via INTRAVENOUS
  Filled 2016-04-28: qty 1

## 2016-04-28 MED ORDER — SODIUM CHLORIDE 0.9 % IV SOLN
80.0000 mg/m2 | Freq: Once | INTRAVENOUS | Status: AC
  Administered 2016-04-28: 100 mg via INTRAVENOUS
  Filled 2016-04-28: qty 5

## 2016-04-28 MED ORDER — SODIUM CHLORIDE 0.9 % IV SOLN
Freq: Once | INTRAVENOUS | Status: AC
Start: 2016-04-28 — End: 2016-04-28
  Administered 2016-04-28: 14:00:00 via INTRAVENOUS
  Filled 2016-04-28: qty 1000

## 2016-04-28 MED ORDER — HEPARIN SOD (PORK) LOCK FLUSH 100 UNIT/ML IV SOLN
500.0000 [IU] | Freq: Once | INTRAVENOUS | Status: AC | PRN
  Administered 2016-04-28: 500 [IU]

## 2016-04-28 MED ORDER — PEGFILGRASTIM 6 MG/0.6ML ~~LOC~~ PSKT
6.0000 mg | PREFILLED_SYRINGE | Freq: Once | SUBCUTANEOUS | Status: AC
  Administered 2016-04-28: 6 mg via SUBCUTANEOUS
  Filled 2016-04-28: qty 0.6

## 2016-05-02 ENCOUNTER — Other Ambulatory Visit: Payer: Self-pay | Admitting: Family Medicine

## 2016-05-02 NOTE — Telephone Encounter (Signed)
She needs to come in to get TSH drawn. If she's totally out, let me know and I'll send her some until we know if the dose is right.

## 2016-05-02 NOTE — Telephone Encounter (Signed)
Patient is not out of medication right now, she states that she will come in before to long to have her labs checked.

## 2016-05-16 ENCOUNTER — Ambulatory Visit: Payer: Commercial Managed Care - HMO | Admitting: Family Medicine

## 2016-05-16 NOTE — Progress Notes (Signed)
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  INTERVAL HISTORY: Patient returns to clinic today for further evaluation and consideration of cycle 3 of carboplatinum and etoposide. She continues to have chronic shortness of breath, but otherwise feels well. She continues to have weakness and fatigue, but this is relatively unchanged. She denies any fevers or illnesses. She denies any chest pain, cough, or hemoptysis. She has a good appetite. She has no nausea, vomiting, constipation, or diarrhea. She has no urinary complaints. Patient offers no further specific complaints.  REVIEW OF SYSTEMS:   Review of Systems  Constitutional: Positive for malaise/fatigue. Negative for fever and weight loss.  Eyes: Negative.  Negative for blurred vision and double vision.  Respiratory: Positive for shortness of breath. Negative for cough and hemoptysis.   Cardiovascular: Negative.  Negative for chest pain and leg swelling.  Gastrointestinal: Negative.  Negative for abdominal pain.  Genitourinary: Negative.   Musculoskeletal: Negative.   Neurological: Positive for weakness. Negative for focal weakness and headaches.  Psychiatric/Behavioral: Negative.  The patient is not nervous/anxious.     As per HPI. Otherwise, a complete review of systems is negative.  PAST MEDICAL HISTORY: Past Medical History:  Diagnosis Date  . 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 Cancer ([place]) 03/10/2016   lung  . Chronic kidney disease   . Collagenous colitis   . COPD (chronic obstructive pulmonary disease) ([place])   . DDD  (degenerative disc disease), cervical   . Depression   . Hernia of abdominal cavity   . Hyperlipidemia   . Osteoporosis   . Pneumonia   . Stroke ([place])   . Tobacco abuse   . Urine incontinence   . Vitamin B 12 deficiency     PAST SURGICAL HISTORY: Past Surgical History:  Procedure Laterality Date  . ABDOMINAL AORTIC ANEURYSM REPAIR    . ABDOMINAL HYSTERECTOMY    . APPENDECTOMY    . BACK SURGERY  [phone]  . CHOLECYSTECTOMY    . FLEXIBLE BRONCHOSCOPY N/A 03/17/2016   Procedure: FLEXIBLE BRONCHOSCOPY;  Surgeon: Wilhelmina Mcardle, MD;  Location: [facility] ORS;  Service: Pulmonary;  Laterality: N/A;  . PERIPHERAL VASCULAR CATHETERIZATION N/A 04/03/2016   Procedure: Glori Luis Cath Insertion;  Surgeon: Algernon Huxley, MD;  Location: [place] [facility];  Service: Cardiovascular;  Laterality: N/A;    FAMILY HISTORY: Family History  Problem Relation Age of Onset  . Diabetes Mother   . Diabetes Father     ADVANCED DIRECTIVES (Y/N):  N  HEALTH MAINTENANCE: Social History  Substance Use Topics  . Smoking status: Former Smoker    Packs/day: 0.25    Types: Cigarettes  . Smokeless tobacco: Never Used     Comment: quit smoking 03/04/16  . Alcohol use No     Colonoscopy:  PAP:  Bone density:  Lipid panel:  Allergies  Allergen Reactions  . Hydrocodone Itching and Rash  . Codeine Nausea And Vomiting  . Morphine And Related Nausea And Vomiting  . Ultram [Tramadol] Itching    Current Outpatient Prescriptions  Medication Sig Dispense Refill  . albuterol (PROVENTIL HFA) 108 (90 Base) MCG/ACT inhaler Inhale  2 puffs into the lungs every 4 (four) hours as needed for wheezing or shortness of breath. 1 Inhaler 0  . atorvastatin (LIPITOR) 20 MG tablet Take 20 mg by mouth at bedtime.    . bisacodyl (DULCOLAX) 5 MG EC tablet Take 5 mg by mouth daily as needed for moderate constipation.    Marland Kitchen buPROPion (WELLBUTRIN SR) 100 MG 12 hr tablet 1 TAB IN AM FOR 3 DAYS, THEN 1 TAB 2X , PICK A DAY TO QUIT  SMOKING IN THE 2ND WEEK 60 tablet 1  . calcium carbonate (OS-CAL) 600 MG TABS tablet Take 600 mg by mouth at bedtime.     . clopidogrel (PLAVIX) 75 MG tablet Take 75 mg by mouth at bedtime.    . cyanocobalamin (,VITAMIN B-12,) 1000 MCG/ML injection Inject 1,000 mcg into the muscle every 30 (thirty) days.    Marland Kitchen levothyroxine (SYNTHROID, LEVOTHROID) 75 MCG tablet Take 1 tablet (75 mcg total) by mouth daily before breakfast. (Patient taking differently: Take 75 mcg by mouth at bedtime. ) 90 tablet 0  . lidocaine (LIDODERM) 5 % Place 1 patch onto the skin daily. Remove & Discard patch within 12 hours or as directed by MD 30 patch 0  . lidocaine-prilocaine (EMLA) cream Apply to port site 1 hour before port is accessed each time 30 g 3  . LORazepam (ATIVAN) 1 MG tablet TAKE 1/2 TABLET EVERY DAY AS NEEDED FOR ANXIETY 30 tablet 1  . mometasone (NASONEX) 50 MCG/ACT nasal spray Place 2 sprays into the nose daily as needed (for rhinitis).     Marland Kitchen omeprazole (PRILOSEC) 20 MG capsule Take 1 capsule (20 mg total) by mouth daily. Take two capsules daily for 3-4 weeks, then return to previous dose of 1 capsule daily (Patient taking differently: Take 20 mg by mouth daily as needed. For acid reflux) 30 capsule 0  . omeprazole (PRILOSEC) 20 MG capsule TAKE 1 CAPSULE (20 MG TOTAL) BY MOUTH DAILY. 30 capsule 3  . ondansetron (ZOFRAN) 8 MG tablet Take 1 tablet (8 mg total) by mouth 2 (two) times daily as needed for refractory nausea / vomiting. 30 tablet 2  . prochlorperazine (COMPAZINE) 10 MG tablet Take 1 tablet (10 mg total) by mouth every 6 (six) hours as needed (Nausea or vomiting). 30 tablet 1   No current facility-administered medications for this visit.    Facility-Administered Medications Ordered in Other Visits  Medication Dose Route Frequency Provider Last Rate Last Dose  . 0.9 %  sodium chloride infusion   Intravenous Once Lloyd Huger, MD      . CARBOplatin (PARAPLATIN) 260 mg in sodium chloride 0.9 %  100 mL chemo infusion  260 mg Intravenous Once Lloyd Huger, MD      . dexamethasone (DECADRON) injection 10 mg  10 mg Intravenous Once Lloyd Huger, MD      . etoposide (VEPESID) 100 mg in sodium chloride 0.9 % 250 mL chemo infusion  80 mg/m2 (Treatment Plan Recorded) Intravenous Once Lloyd Huger, MD      . heparin lock flush 100 unit/mL  500 Units Intracatheter Once PRN Lloyd Huger, MD      . palonosetron (ALOXI) injection 0.25 mg  0.25 mg Intravenous Once Lloyd Huger, MD        OBJECTIVE: Vitals:   05/17/16 1001  BP: 112/71  Pulse: 93  Resp: 16  Temp: 97.8 F (36.6 C)     Body mass index is 14.14 kg/m.  ECOG FS:1 - Symptomatic but completely ambulatory  General: Thin, no acute distress. Eyes: Pink conjunctiva, anicteric sclera. Lungs: Clear to auscultation bilaterally. Heart: Regular rate and rhythm. No rubs, murmurs, or gallops. Abdomen: Soft, nontender, nondistended. No organomegaly noted, normoactive bowel sounds. Musculoskeletal: No edema, cyanosis, or clubbing. Neuro: Alert, answering all questions appropriately. Cranial nerves grossly intact. Skin: No rashes or petechiae noted. Psych: Normal affect.   LAB RESULTS:  Lab Results  Component Value Date   NA 132 (L) 05/17/2016   K 4.0 05/17/2016   CL 102 05/17/2016   CO2 23 05/17/2016   GLUCOSE 75 05/17/2016   BUN 9 05/17/2016   CREATININE 0.79 05/17/2016   CALCIUM 8.4 (L) 05/17/2016   PROT 6.4 (L) 05/17/2016   ALBUMIN 3.5 05/17/2016   AST 22 05/17/2016   ALT 20 05/17/2016   ALKPHOS 130 (H) 05/17/2016   BILITOT 0.4 05/17/2016   GFRNONAA >60 05/17/2016   GFRAA >60 05/17/2016    Lab Results  Component Value Date   WBC 22.5 (H) 05/17/2016   NEUTROABS 19.8 (H) 05/17/2016   HGB 10.5 (L) 05/17/2016   HCT 31.6 (L) 05/17/2016   MCV 96.3 05/17/2016   PLT 298 05/17/2016     STUDIES: No results found.  ASSESSMENT: Stage IV left hilar small cell lung cancer with right lung  metastasis  PLAN:     1.  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 Patient noted to have bilateral disease on bronchoscopy. PET scan results reviewed independently with bilateral lung metastasis as well as a hypermetabolic right sacral bone metastasis. MRI the brain is negative for intracranial metastasis. Proceed with cycle 3, day 1 of carboplatinum and etoposide today. Return to clinic in 1 and 2 days for etoposide only and then in 3 weeks for consideration of cycle 4. Plan to reimage at the conclusion of cycle 4.  2. Headaches: MRI negative for metastasis, monitor. 3. Pain: Patient does not complain of pain today. Monitor. 4. Weight loss: Likely multifactorial including possible underlying malignancy. Consider Megace in the future. 5. Thrombocytopenia: Resolved. Secondary chemotherapy, monitor.  6. Leukocytosis: Likely secondary to Neulasta, monitor.   Patient expressed understanding and was in agreement with this plan. She also understands that She can call clinic at any time with any questions, concerns, or complaints.   Primary cancer of left lung metastatic to other site [facility])   Staging form: Lung, AJCC 7th Edition   - Clinical stage from 03/12/2016: Stage IV (T3, N3, M1a) - Signed by Lloyd Huger, MD on 03/26/2016  Lloyd Huger, MD   05/17/2016 10:37 AM

## 2016-05-17 ENCOUNTER — Inpatient Hospital Stay: Payer: Commercial Managed Care - HMO | Attending: Oncology

## 2016-05-17 ENCOUNTER — Inpatient Hospital Stay (HOSPITAL_BASED_OUTPATIENT_CLINIC_OR_DEPARTMENT_OTHER): Payer: Commercial Managed Care - HMO | Admitting: Oncology

## 2016-05-17 ENCOUNTER — Inpatient Hospital Stay: Payer: Commercial Managed Care - HMO

## 2016-05-17 VITALS — BP 112/71 | HR 93 | Temp 97.8°F | Resp 16 | Wt 79.8 lb

## 2016-05-17 DIAGNOSIS — Z87891 Personal history of nicotine dependence: Secondary | ICD-10-CM

## 2016-05-17 DIAGNOSIS — M818 Other osteoporosis without current pathological fracture: Secondary | ICD-10-CM | POA: Diagnosis not present

## 2016-05-17 DIAGNOSIS — F17210 Nicotine dependence, cigarettes, uncomplicated: Secondary | ICD-10-CM | POA: Insufficient documentation

## 2016-05-17 DIAGNOSIS — R32 Unspecified urinary incontinence: Secondary | ICD-10-CM | POA: Diagnosis not present

## 2016-05-17 DIAGNOSIS — R634 Abnormal weight loss: Secondary | ICD-10-CM

## 2016-05-17 DIAGNOSIS — N189 Chronic kidney disease, unspecified: Secondary | ICD-10-CM | POA: Insufficient documentation

## 2016-05-17 DIAGNOSIS — K458 Other specified abdominal hernia without obstruction or gangrene: Secondary | ICD-10-CM | POA: Diagnosis not present

## 2016-05-17 DIAGNOSIS — R51 Headache: Secondary | ICD-10-CM | POA: Diagnosis not present

## 2016-05-17 DIAGNOSIS — Z7689 Persons encountering health services in other specified circumstances: Secondary | ICD-10-CM | POA: Diagnosis not present

## 2016-05-17 DIAGNOSIS — C3492 Malignant neoplasm of unspecified part of left bronchus or lung: Secondary | ICD-10-CM

## 2016-05-17 DIAGNOSIS — D72819 Decreased white blood cell count, unspecified: Secondary | ICD-10-CM | POA: Diagnosis not present

## 2016-05-17 DIAGNOSIS — M5030 Other cervical disc degeneration, unspecified cervical region: Secondary | ICD-10-CM | POA: Insufficient documentation

## 2016-05-17 DIAGNOSIS — C7801 Secondary malignant neoplasm of right lung: Secondary | ICD-10-CM | POA: Diagnosis not present

## 2016-05-17 DIAGNOSIS — R5383 Other fatigue: Secondary | ICD-10-CM

## 2016-05-17 DIAGNOSIS — Z5111 Encounter for antineoplastic chemotherapy: Secondary | ICD-10-CM | POA: Diagnosis not present

## 2016-05-17 DIAGNOSIS — R0602 Shortness of breath: Secondary | ICD-10-CM

## 2016-05-17 DIAGNOSIS — I714 Abdominal aortic aneurysm, without rupture: Secondary | ICD-10-CM | POA: Insufficient documentation

## 2016-05-17 DIAGNOSIS — C3402 Malignant neoplasm of left main bronchus: Secondary | ICD-10-CM | POA: Insufficient documentation

## 2016-05-17 DIAGNOSIS — Z9049 Acquired absence of other specified parts of digestive tract: Secondary | ICD-10-CM

## 2016-05-17 DIAGNOSIS — E538 Deficiency of other specified B group vitamins: Secondary | ICD-10-CM | POA: Insufficient documentation

## 2016-05-17 DIAGNOSIS — E785 Hyperlipidemia, unspecified: Secondary | ICD-10-CM | POA: Insufficient documentation

## 2016-05-17 DIAGNOSIS — Z8701 Personal history of pneumonia (recurrent): Secondary | ICD-10-CM

## 2016-05-17 DIAGNOSIS — C7951 Secondary malignant neoplasm of bone: Secondary | ICD-10-CM | POA: Diagnosis not present

## 2016-05-17 DIAGNOSIS — Z79899 Other long term (current) drug therapy: Secondary | ICD-10-CM

## 2016-05-17 DIAGNOSIS — J449 Chronic obstructive pulmonary disease, unspecified: Secondary | ICD-10-CM | POA: Insufficient documentation

## 2016-05-17 DIAGNOSIS — R531 Weakness: Secondary | ICD-10-CM | POA: Insufficient documentation

## 2016-05-17 DIAGNOSIS — F329 Major depressive disorder, single episode, unspecified: Secondary | ICD-10-CM | POA: Insufficient documentation

## 2016-05-17 DIAGNOSIS — Z8673 Personal history of transient ischemic attack (TIA), and cerebral infarction without residual deficits: Secondary | ICD-10-CM | POA: Insufficient documentation

## 2016-05-17 LAB — CBC WITH DIFFERENTIAL/PLATELET
BASOS PCT: 1 %
Basophils Absolute: 0.2 10*3/uL — ABNORMAL HIGH (ref 0–0.1)
EOS ABS: 0 10*3/uL (ref 0–0.7)
Eosinophils Relative: 0 %
HEMATOCRIT: 31.6 % — AB (ref 35.0–47.0)
HEMOGLOBIN: 10.5 g/dL — AB (ref 12.0–16.0)
Lymphocytes Relative: 6 %
Lymphs Abs: 1.3 10*3/uL (ref 1.0–3.6)
MCH: 31.8 pg (ref 26.0–34.0)
MCHC: 33.1 g/dL (ref 32.0–36.0)
MCV: 96.3 fL (ref 80.0–100.0)
Monocytes Absolute: 1.2 10*3/uL — ABNORMAL HIGH (ref 0.2–0.9)
Monocytes Relative: 5 %
NEUTROS ABS: 19.8 10*3/uL — AB (ref 1.4–6.5)
NEUTROS PCT: 88 %
Platelets: 298 10*3/uL (ref 150–440)
RBC: 3.28 MIL/uL — AB (ref 3.80–5.20)
RDW: 18.2 % — ABNORMAL HIGH (ref 11.5–14.5)
WBC: 22.5 10*3/uL — AB (ref 3.6–11.0)

## 2016-05-17 LAB — COMPREHENSIVE METABOLIC PANEL
ALBUMIN: 3.5 g/dL (ref 3.5–5.0)
ALK PHOS: 130 U/L — AB (ref 38–126)
ALT: 20 U/L (ref 14–54)
ANION GAP: 7 (ref 5–15)
AST: 22 U/L (ref 15–41)
BILIRUBIN TOTAL: 0.4 mg/dL (ref 0.3–1.2)
BUN: 9 mg/dL (ref 6–20)
CALCIUM: 8.4 mg/dL — AB (ref 8.9–10.3)
CO2: 23 mmol/L (ref 22–32)
CREATININE: 0.79 mg/dL (ref 0.44–1.00)
Chloride: 102 mmol/L (ref 101–111)
GFR calc Af Amer: >60 mL/min (ref >60)
GFR calc non Af Amer: >60 mL/min (ref >60)
GLUCOSE: 75 mg/dL (ref 65–99)
Potassium: 4 mmol/L (ref 3.5–5.1)
SODIUM: 132 mmol/L — AB (ref 135–145)
Total Protein: 6.4 g/dL — ABNORMAL LOW (ref 6.5–8.1)

## 2016-05-17 MED ORDER — CARBOPLATIN CHEMO INJECTION 450 MG/45ML
260.0000 mg | Freq: Once | INTRAVENOUS | Status: AC
  Administered 2016-05-17: 260 mg via INTRAVENOUS
  Filled 2016-05-17: qty 26

## 2016-05-17 MED ORDER — SODIUM CHLORIDE 0.9 % IV SOLN
Freq: Once | INTRAVENOUS | Status: AC
  Administered 2016-05-17: 11:00:00 via INTRAVENOUS
  Filled 2016-05-17: qty 1000

## 2016-05-17 MED ORDER — HEPARIN SOD (PORK) LOCK FLUSH 100 UNIT/ML IV SOLN
500.0000 [IU] | Freq: Once | INTRAVENOUS | Status: AC | PRN
  Administered 2016-05-17: 500 [IU]
  Filled 2016-05-17: qty 5

## 2016-05-17 MED ORDER — DEXAMETHASONE SODIUM PHOSPHATE 10 MG/ML IJ SOLN
10.0000 mg | Freq: Once | INTRAMUSCULAR | Status: AC
  Administered 2016-05-17: 10 mg via INTRAVENOUS
  Filled 2016-05-17: qty 1

## 2016-05-17 MED ORDER — PALONOSETRON HCL INJECTION 0.25 MG/5ML
0.2500 mg | Freq: Once | INTRAVENOUS | Status: AC
  Administered 2016-05-17: 0.25 mg via INTRAVENOUS
  Filled 2016-05-17: qty 5

## 2016-05-17 MED ORDER — SODIUM CHLORIDE 0.9 % IV SOLN
80.0000 mg/m2 | Freq: Once | INTRAVENOUS | Status: AC
  Administered 2016-05-17: 100 mg via INTRAVENOUS
  Filled 2016-05-17: qty 5

## 2016-05-17 NOTE — Progress Notes (Signed)
Patient has episodes of low back pain that feels like a muscle spasm.  Appetite is doing great.

## 2016-05-18 ENCOUNTER — Telehealth: Payer: Self-pay | Admitting: Family Medicine

## 2016-05-18 ENCOUNTER — Inpatient Hospital Stay: Payer: Commercial Managed Care - HMO

## 2016-05-18 VITALS — BP 106/69 | HR 92 | Temp 97.0°F | Resp 18

## 2016-05-18 DIAGNOSIS — Z7689 Persons encountering health services in other specified circumstances: Secondary | ICD-10-CM | POA: Diagnosis not present

## 2016-05-18 DIAGNOSIS — C3402 Malignant neoplasm of left main bronchus: Secondary | ICD-10-CM | POA: Diagnosis not present

## 2016-05-18 DIAGNOSIS — C7951 Secondary malignant neoplasm of bone: Secondary | ICD-10-CM | POA: Diagnosis not present

## 2016-05-18 DIAGNOSIS — C7801 Secondary malignant neoplasm of right lung: Secondary | ICD-10-CM | POA: Diagnosis not present

## 2016-05-18 DIAGNOSIS — R51 Headache: Secondary | ICD-10-CM | POA: Diagnosis not present

## 2016-05-18 DIAGNOSIS — R634 Abnormal weight loss: Secondary | ICD-10-CM | POA: Diagnosis not present

## 2016-05-18 DIAGNOSIS — Z5111 Encounter for antineoplastic chemotherapy: Secondary | ICD-10-CM | POA: Diagnosis not present

## 2016-05-18 DIAGNOSIS — C3492 Malignant neoplasm of unspecified part of left bronchus or lung: Secondary | ICD-10-CM

## 2016-05-18 DIAGNOSIS — R5383 Other fatigue: Secondary | ICD-10-CM | POA: Diagnosis not present

## 2016-05-18 DIAGNOSIS — D72819 Decreased white blood cell count, unspecified: Secondary | ICD-10-CM | POA: Diagnosis not present

## 2016-05-18 MED ORDER — SODIUM CHLORIDE 0.9 % IV SOLN
80.0000 mg/m2 | Freq: Once | INTRAVENOUS | Status: AC
  Administered 2016-05-18: 100 mg via INTRAVENOUS
  Filled 2016-05-18: qty 5

## 2016-05-18 MED ORDER — DEXAMETHASONE SODIUM PHOSPHATE 10 MG/ML IJ SOLN
10.0000 mg | Freq: Once | INTRAMUSCULAR | Status: AC
  Administered 2016-05-18: 10 mg via INTRAVENOUS
  Filled 2016-05-18: qty 1

## 2016-05-18 MED ORDER — HEPARIN SOD (PORK) LOCK FLUSH 100 UNIT/ML IV SOLN
500.0000 [IU] | Freq: Once | INTRAVENOUS | Status: AC | PRN
  Administered 2016-05-18: 500 [IU]
  Filled 2016-05-18: qty 5

## 2016-05-18 MED ORDER — SODIUM CHLORIDE 0.9 % IV SOLN
Freq: Once | INTRAVENOUS | Status: AC
  Administered 2016-05-18: 14:00:00 via INTRAVENOUS
  Filled 2016-05-18: qty 1000

## 2016-05-18 MED ORDER — LORAZEPAM 1 MG PO TABS: 1.0000 mg | ORAL_TABLET | Freq: Every day | ORAL | 1 refills | Status: DC | PRN

## 2016-05-18 NOTE — Telephone Encounter (Signed)
Called into [facility]

## 2016-05-18 NOTE — Telephone Encounter (Signed)
Pt would like to get a refill for LORazepam (ATIVAN) 1 MG tabletLORazepam (ATIVAN) 1 MG tablet.   She stated that some days she has to take 2 in due to anxiety from cancer and chemo treatments.

## 2016-05-18 NOTE — Telephone Encounter (Signed)
Melody Green, would you please call this in for her? Thanks!

## 2016-05-19 ENCOUNTER — Encounter: Payer: Self-pay | Admitting: Family Medicine

## 2016-05-19 ENCOUNTER — Ambulatory Visit (INDEPENDENT_AMBULATORY_CARE_PROVIDER_SITE_OTHER): Payer: Commercial Managed Care - HMO | Admitting: Family Medicine

## 2016-05-19 ENCOUNTER — Inpatient Hospital Stay: Payer: Commercial Managed Care - HMO

## 2016-05-19 VITALS — BP 117/73 | HR 92 | Temp 97.7°F | Wt 83.7 lb

## 2016-05-19 VITALS — BP 96/66 | HR 81 | Temp 97.2°F | Resp 18

## 2016-05-19 DIAGNOSIS — C7801 Secondary malignant neoplasm of right lung: Secondary | ICD-10-CM | POA: Diagnosis not present

## 2016-05-19 DIAGNOSIS — D72819 Decreased white blood cell count, unspecified: Secondary | ICD-10-CM | POA: Diagnosis not present

## 2016-05-19 DIAGNOSIS — E039 Hypothyroidism, unspecified: Secondary | ICD-10-CM

## 2016-05-19 DIAGNOSIS — C3492 Malignant neoplasm of unspecified part of left bronchus or lung: Secondary | ICD-10-CM

## 2016-05-19 DIAGNOSIS — C3402 Malignant neoplasm of left main bronchus: Secondary | ICD-10-CM | POA: Diagnosis not present

## 2016-05-19 DIAGNOSIS — R5383 Other fatigue: Secondary | ICD-10-CM | POA: Diagnosis not present

## 2016-05-19 DIAGNOSIS — Z23 Encounter for immunization: Secondary | ICD-10-CM

## 2016-05-19 DIAGNOSIS — R51 Headache: Secondary | ICD-10-CM | POA: Diagnosis not present

## 2016-05-19 DIAGNOSIS — Z5111 Encounter for antineoplastic chemotherapy: Secondary | ICD-10-CM | POA: Diagnosis not present

## 2016-05-19 DIAGNOSIS — Z7689 Persons encountering health services in other specified circumstances: Secondary | ICD-10-CM | POA: Diagnosis not present

## 2016-05-19 DIAGNOSIS — C7951 Secondary malignant neoplasm of bone: Secondary | ICD-10-CM | POA: Diagnosis not present

## 2016-05-19 DIAGNOSIS — R634 Abnormal weight loss: Secondary | ICD-10-CM | POA: Diagnosis not present

## 2016-05-19 MED ORDER — PEGFILGRASTIM 6 MG/0.6ML ~~LOC~~ PSKT
6.0000 mg | PREFILLED_SYRINGE | Freq: Once | SUBCUTANEOUS | Status: AC
  Administered 2016-05-19: 6 mg via SUBCUTANEOUS
  Filled 2016-05-19: qty 0.6

## 2016-05-19 MED ORDER — SODIUM CHLORIDE 0.9 % IV SOLN
80.0000 mg/m2 | Freq: Once | INTRAVENOUS | Status: AC
  Administered 2016-05-19: 100 mg via INTRAVENOUS
  Filled 2016-05-19: qty 5

## 2016-05-19 MED ORDER — HEPARIN SOD (PORK) LOCK FLUSH 100 UNIT/ML IV SOLN
500.0000 [IU] | Freq: Once | INTRAVENOUS | Status: AC | PRN
  Administered 2016-05-19: 500 [IU]
  Filled 2016-05-19: qty 5

## 2016-05-19 MED ORDER — SODIUM CHLORIDE 0.9 % IV SOLN
Freq: Once | INTRAVENOUS | Status: AC
  Administered 2016-05-19: 14:00:00 via INTRAVENOUS
  Filled 2016-05-19: qty 1000

## 2016-05-19 MED ORDER — DEXAMETHASONE SODIUM PHOSPHATE 10 MG/ML IJ SOLN
10.0000 mg | Freq: Once | INTRAMUSCULAR | Status: AC
  Administered 2016-05-19: 10 mg via INTRAVENOUS
  Filled 2016-05-19: qty 1

## 2016-05-19 MED ORDER — LORAZEPAM 1 MG PO TABS: 1.0000 mg | ORAL_TABLET | Freq: Every day | ORAL | 1 refills | Status: DC | PRN

## 2016-05-19 NOTE — Assessment & Plan Note (Signed)
Checking labs today. Await results. Adjust as needed. Call with any concerns.

## 2016-05-19 NOTE — Progress Notes (Signed)
BP 117/73 (BP Location: Left Arm, Patient Position: Sitting, Cuff Size: Small)   Pulse 92   Temp 97.7 F (36.5 C)   Wt 83 lb 11.2 oz (38 kg)   LMP  (Approximate)   SpO2 99%   BMI 14.83 kg/m    Subjective:    Patient ID: Melody Green, female    DOB: [birth year], 75 y.o.   MRN: [id number]  HPI: Melody Green is a 75 y.o. female  Chief Complaint  Patient presents with  . Hypothyroidism   Doing well with her chemo. Feeling well. Happy with how she's doing.   HYPOTHYROIDISM Thyroid control status:better Satisfied with current treatment? yes Medication side effects: no Medication compliance: excellent compliance Recent dose adjustment:yes Fatigue: no Cold intolerance: yes Heat intolerance: no Weight gain: no Weight loss: yes Constipation: yes Diarrhea/loose stools: no Palpitations: no Lower extremity edema: no Anxiety/depressed mood: no   Relevant past medical, surgical, family and social history reviewed and updated as indicated. Interim medical history since our last visit reviewed. Allergies and medications reviewed and updated.  Review of Systems  Constitutional: Negative.   Respiratory: Negative.   Cardiovascular: Negative.   Psychiatric/Behavioral: Negative.     Per HPI unless specifically indicated above     Objective:    BP 117/73 (BP Location: Left Arm, Patient Position: Sitting, Cuff Size: Small)   Pulse 92   Temp 97.7 F (36.5 C)   Wt 83 lb 11.2 oz (38 kg)   LMP  (Approximate)   SpO2 99%   BMI 14.83 kg/m   Wt Readings from Last 3 Encounters:  05/19/16 83 lb 11.2 oz (38 kg)  05/17/16 79 lb 12.9 oz (36.2 kg)  04/26/16 79 lb 2.3 oz (35.9 kg)    Physical Exam  Constitutional: She is oriented to person, place, and time. She appears well-developed and well-nourished. No distress.  HENT:  Head: Normocephalic and atraumatic.  Right Ear: Hearing normal.  Left Ear: Hearing normal.  Nose: Nose normal.  Eyes: Conjunctivae and lids are normal. Right  eye exhibits no discharge. Left eye exhibits no discharge. No scleral icterus.  Cardiovascular: Normal rate, regular rhythm, normal heart sounds and intact distal pulses.  Exam reveals no gallop and no friction rub.   No murmur heard. Pulmonary/Chest: Effort normal and breath sounds normal. No respiratory distress. She has no wheezes. She has no rales. She exhibits no tenderness.  Musculoskeletal: Normal range of motion.  Neurological: She is alert and oriented to person, place, and time.  Skin: Skin is warm, dry and intact. No rash noted. No erythema. No pallor.  Psychiatric: She has a normal mood and affect. Her speech is normal and behavior is normal. Judgment and thought content normal. Cognition and memory are normal.  Nursing note and vitals reviewed.   Results for orders placed or performed in visit on 05/17/16  CBC with Differential  Result Value Ref Range   WBC 22.5 (H) 3.6 - 11.0 K/uL   RBC 3.28 (L) 3.80 - 5.20 MIL/uL   Hemoglobin 10.5 (L) 12.0 - 16.0 g/dL   HCT 31.6 (L) 35.0 - 47.0 %   MCV 96.3 80.0 - 100.0 fL   MCH 31.8 26.0 - 34.0 pg   MCHC 33.1 32.0 - 36.0 g/dL   RDW 18.2 (H) 11.5 - 14.5 %   Platelets 298 150 - 440 K/uL   Neutrophils Relative % 88 %   Neutro Abs 19.8 (H) 1.4 - 6.5 K/uL   Lymphocytes Relative 6 %  Lymphs Abs 1.3 1.0 - 3.6 K/uL   Monocytes Relative 5 %   Monocytes Absolute 1.2 (H) 0.2 - 0.9 K/uL   Eosinophils Relative 0 %   Eosinophils Absolute 0.0 0 - 0.7 K/uL   Basophils Relative 1 %   Basophils Absolute 0.2 (H) 0 - 0.1 K/uL  Comprehensive metabolic panel  Result Value Ref Range   Sodium 132 (L) 135 - 145 mmol/L   Potassium 4.0 3.5 - 5.1 mmol/L   Chloride 102 101 - 111 mmol/L   CO2 23 22 - 32 mmol/L   Glucose, Bld 75 65 - 99 mg/dL   BUN 9 6 - 20 mg/dL   Creatinine, Ser 0.79 0.44 - 1.00 mg/dL   Calcium 8.4 (L) 8.9 - 10.3 mg/dL   Total Protein 6.4 (L) 6.5 - 8.1 g/dL   Albumin 3.5 3.5 - 5.0 g/dL   AST 22 15 - 41 U/L   ALT 20 14 - 54 U/L    Alkaline Phosphatase 130 (H) 38 - 126 U/L   Total Bilirubin 0.4 0.3 - 1.2 mg/dL   GFR calc non Af Amer >60 >60 mL/min   GFR calc Af Amer >60 >60 mL/min   Anion gap 7 5 - 15      Assessment & Plan:   Problem List Items Addressed This Visit      Endocrine   Hypothyroid - Primary    Checking labs today. Await results. Adjust as needed. Call with any concerns.       Relevant Orders   TSH    Other Visit Diagnoses    Immunization due       Flu shot given today.   Relevant Orders   Flu vaccine HIGH DOSE PF (Fluzone High dose) (Completed)       Follow up plan: Return for Follow up.

## 2016-05-19 NOTE — Patient Instructions (Signed)

## 2016-05-20 LAB — TSH: TSH: 1.32 u[IU]/mL (ref 0.450–4.500)

## 2016-05-22 ENCOUNTER — Telehealth: Payer: Self-pay | Admitting: Family Medicine

## 2016-05-22 ENCOUNTER — Telehealth: Payer: Self-pay | Admitting: *Deleted

## 2016-05-22 MED ORDER — LEVOTHYROXINE SODIUM 75 MCG PO TABS: 75.0000 ug | ORAL_TABLET | Freq: Every day | ORAL | 3 refills | Status: DC

## 2016-05-22 NOTE — Telephone Encounter (Signed)
Called to report that she has not had a BM since last Tuesday. She has taken Dulcolax tabs and drinking juices without results, Please advise

## 2016-05-22 NOTE — Telephone Encounter (Signed)
Please let her know that her thyroid came back normal so I sent a years supply to her pharmacy. Thanks!

## 2016-05-22 NOTE — Telephone Encounter (Signed)
Patient notified

## 2016-05-22 NOTE — Telephone Encounter (Signed)
Patient instructed per VO Dr Grayland Ormond to try Miralax or Mag Citrate for her constipation. She reports that she finally went this morning, but she will get some Miralax and take daily

## 2016-05-27 ENCOUNTER — Encounter: Payer: Self-pay | Admitting: Emergency Medicine

## 2016-05-27 ENCOUNTER — Emergency Department: Payer: Commercial Managed Care - HMO

## 2016-05-27 ENCOUNTER — Emergency Department
Admission: EM | Admit: 2016-05-27 | Discharge: 2016-05-27 | Disposition: A | Discharge status: Home / Self Care | Payer: Commercial Managed Care - HMO | Attending: Student in an Organized Health Care Education/Training Program | Admitting: Student in an Organized Health Care Education/Training Program

## 2016-05-27 DIAGNOSIS — S39012A Strain of muscle, fascia and tendon of lower back, initial encounter: Secondary | ICD-10-CM | POA: Insufficient documentation

## 2016-05-27 DIAGNOSIS — N184 Chronic kidney disease, stage 4 (severe): Secondary | ICD-10-CM | POA: Diagnosis not present

## 2016-05-27 DIAGNOSIS — E039 Hypothyroidism, unspecified: Secondary | ICD-10-CM | POA: Insufficient documentation

## 2016-05-27 DIAGNOSIS — I129 Hypertensive chronic kidney disease with stage 1 through stage 4 chronic kidney disease, or unspecified chronic kidney disease: Secondary | ICD-10-CM | POA: Insufficient documentation

## 2016-05-27 DIAGNOSIS — Z791 Long term (current) use of non-steroidal anti-inflammatories (NSAID): Secondary | ICD-10-CM | POA: Insufficient documentation

## 2016-05-27 DIAGNOSIS — Z87891 Personal history of nicotine dependence: Secondary | ICD-10-CM | POA: Diagnosis not present

## 2016-05-27 DIAGNOSIS — S32010A Wedge compression fracture of first lumbar vertebra, initial encounter for closed fracture: Secondary | ICD-10-CM | POA: Diagnosis not present

## 2016-05-27 DIAGNOSIS — J449 Chronic obstructive pulmonary disease, unspecified: Secondary | ICD-10-CM | POA: Insufficient documentation

## 2016-05-27 DIAGNOSIS — Z85118 Personal history of other malignant neoplasm of bronchus and lung: Secondary | ICD-10-CM | POA: Insufficient documentation

## 2016-05-27 DIAGNOSIS — S3992XA Unspecified injury of lower back, initial encounter: Secondary | ICD-10-CM | POA: Diagnosis present

## 2016-05-27 DIAGNOSIS — X503XXA Overexertion from repetitive movements, initial encounter: Secondary | ICD-10-CM | POA: Diagnosis not present

## 2016-05-27 DIAGNOSIS — Z79899 Other long term (current) drug therapy: Secondary | ICD-10-CM | POA: Insufficient documentation

## 2016-05-27 DIAGNOSIS — E785 Hyperlipidemia, unspecified: Secondary | ICD-10-CM | POA: Insufficient documentation

## 2016-05-27 DIAGNOSIS — Y999 Unspecified external cause status: Secondary | ICD-10-CM | POA: Insufficient documentation

## 2016-05-27 DIAGNOSIS — Y9389 Activity, other specified: Secondary | ICD-10-CM | POA: Insufficient documentation

## 2016-05-27 DIAGNOSIS — Y929 Unspecified place or not applicable: Secondary | ICD-10-CM | POA: Diagnosis not present

## 2016-05-27 MED ORDER — TRAMADOL HCL 50 MG PO TABS
ORAL_TABLET | ORAL | Status: AC
  Filled 2016-05-27: qty 1

## 2016-05-27 MED ORDER — CYCLOBENZAPRINE HCL 10 MG PO TABS
5.0000 mg | ORAL_TABLET | Freq: Once | ORAL | Status: AC
  Administered 2016-05-27: 5 mg via ORAL
  Filled 2016-05-27: qty 1

## 2016-05-27 MED ORDER — CYCLOBENZAPRINE HCL 10 MG PO TABS: 10.0000 mg | ORAL_TABLET | Freq: Two times a day (BID) | ORAL | 0 refills | Status: DC

## 2016-05-27 MED ORDER — CYCLOBENZAPRINE HCL 10 MG PO TABS: 5.0000 mg | ORAL_TABLET | Freq: Two times a day (BID) | ORAL | 0 refills | Status: DC

## 2016-05-27 MED ORDER — OXYCODONE-ACETAMINOPHEN 5-325 MG PO TABS: 1.0000 | ORAL_TABLET | Freq: Three times a day (TID) | ORAL | 0 refills | Status: DC | PRN

## 2016-05-27 MED ORDER — TRAMADOL HCL 50 MG PO TABS
50.0000 mg | ORAL_TABLET | Freq: Once | ORAL | Status: AC
  Administered 2016-05-27: 50 mg via ORAL

## 2016-05-27 NOTE — ED Provider Notes (Signed)
[facility]  Emergency Department Provider Note  ____________________________________________   First MD Initiated Contact with Patient 05/27/16 1142     (approximate)  I have reviewed the triage vital signs and the nursing notes.   HISTORY  Chief Complaint Fall and Back Pain    HPI Melody Green is a 75 y.o. female patient complain of low back pain secondary to bending over to pull a tick on him. Patient states she did not fall. Patient state the pain has increased in the last 2 days. Patient denies any radicular component to this pain. Patient denies any bladder or bowel dysfunction.Patient is rating the pain as a 10 over 10. Patient describes pain as "sharp". Patient state her pain medication is not helping with the pain. Patient states she normally takes 1 mg morphine every morning in order to get out of bed. The morphine is not  for her back pain,  is for pain secondary to her lung cancer.  Patient state flexion and lateral movements increased pain to her low back.  Past Medical History:  Diagnosis Date  . Abdominal aneurysm ([place])   . Cancer ([place]) 03/10/2016   lung  . Chronic kidney disease   . Collagenous colitis   . COPD (chronic obstructive pulmonary disease) ([place])   . DDD (degenerative disc disease), cervical   . Depression   . Hernia of abdominal cavity   . Hyperlipidemia   . Osteoporosis   . Pneumonia   . Stroke ([place])   . Tobacco abuse   . Urine incontinence   . Vitamin B 12 deficiency     Patient Active Problem List   Diagnosis Date Noted  . Stroke ([place])   . Primary cancer of left lung metastatic to other site ([place]) 03/12/2016  . Tobacco abuse 02/08/2016  . History of small bowel obstruction   . Protein-calorie malnutrition, severe 07/24/2015  . Abdominal pain 06/14/2015  . Malnutrition ([place]) 05/03/2015  . Hypothyroid 05/03/2015  . Purpura senilis ([place]) 05/03/2015  . Positional vertigo of both ears 05/03/2015  . Depression  12/23/2014  . Hyperlipidemia 12/23/2014  . Benign hypertension with CKD (chronic kidney disease) stage IV ([place]) 12/04/2014  . Dizziness 12/04/2014  . Hyponatremia 12/04/2014    Past Surgical History:  Procedure Laterality Date  . ABDOMINAL AORTIC ANEURYSM REPAIR    . ABDOMINAL HYSTERECTOMY    . APPENDECTOMY    . BACK SURGERY  [phone]  . CHOLECYSTECTOMY    . FLEXIBLE BRONCHOSCOPY N/A 03/17/2016   Procedure: FLEXIBLE BRONCHOSCOPY;  Surgeon: Wilhelmina Mcardle, MD;  Location: [facility] ORS;  Service: Pulmonary;  Laterality: N/A;  . PERIPHERAL VASCULAR CATHETERIZATION N/A 04/03/2016   Procedure: Glori Luis Cath Insertion;  Surgeon: Algernon Huxley, MD;  Location: [facility];  Service: Cardiovascular;  Laterality: N/A;    Prior to Admission medications   Medication Sig Start Date End Date Taking? Authorizing Provider  albuterol (PROVENTIL HFA) 108 (90 Base) MCG/ACT inhaler Inhale 2 puffs into the lungs every 4 (four) hours as needed for wheezing or shortness of breath. 03/12/16   Carrie Mew, MD  atorvastatin (LIPITOR) 20 MG tablet Take 20 mg by mouth at bedtime.    Historical Provider, MD  bisacodyl (DULCOLAX) 5 MG EC tablet Take 5 mg by mouth daily as needed for moderate constipation.    Historical Provider, MD  buPROPion (WELLBUTRIN SR) 100 MG 12 hr tablet 1 TAB IN AM FOR 3 DAYS, THEN 1 TAB 2X , PICK A DAY TO  QUIT SMOKING IN THE 2ND WEEK 04/17/16   Megan P Johnson, DO  calcium carbonate (OS-CAL) 600 MG TABS tablet Take 600 mg by mouth at bedtime.     Historical Provider, MD  clopidogrel (PLAVIX) 75 MG tablet Take 75 mg by mouth at bedtime.    Historical Provider, MD  cyanocobalamin (,VITAMIN B-12,) 1000 MCG/ML injection Inject 1,000 mcg into the muscle every 30 (thirty) days.    Historical Provider, MD  cyclobenzaprine (FLEXERIL) 10 MG tablet Take 0.5 tablets (5 mg total) by mouth 2 (two) times daily with a meal. 05/27/16   Sable Feil, PA-C  cyclobenzaprine (FLEXERIL) 10 MG tablet Take 1  tablet (10 mg total) by mouth 2 (two) times daily with a meal. 05/27/16   Sable Feil, PA-C  levothyroxine (SYNTHROID, LEVOTHROID) 75 MCG tablet Take 1 tablet (75 mcg total) by mouth daily before breakfast. 05/22/16   Megan P Johnson, DO  lidocaine (LIDODERM) 5 % Place 1 patch onto the skin daily. Remove & Discard patch within 12 hours or as directed by MD 02/08/16   Valerie Roys, DO  lidocaine-prilocaine (EMLA) cream Apply to port site 1 hour before port is accessed each time 04/04/16   Lloyd Huger, MD  LORazepam (ATIVAN) 1 MG tablet Take 1-2 tablets (1-2 mg total) by mouth daily as needed for anxiety. 05/19/16   Megan P Johnson, DO  mometasone (NASONEX) 50 MCG/ACT nasal spray Place 2 sprays into the nose daily as needed (for rhinitis).     Historical Provider, MD  omeprazole (PRILOSEC) 20 MG capsule TAKE 1 CAPSULE (20 MG TOTAL) BY MOUTH DAILY. 04/28/16   Megan P Johnson, DO  ondansetron (ZOFRAN) 8 MG tablet Take 1 tablet (8 mg total) by mouth 2 (two) times daily as needed for refractory nausea / vomiting. 04/04/16   Lloyd Huger, MD  oxyCODONE-acetaminophen (ROXICET) 5-325 MG tablet Take 1 tablet by mouth every 8 (eight) hours as needed for moderate pain. 05/27/16   Sable Feil, PA-C  prochlorperazine (COMPAZINE) 10 MG tablet Take 1 tablet (10 mg total) by mouth every 6 (six) hours as needed (Nausea or vomiting). 04/04/16   Lloyd Huger, MD    Allergies Hydrocodone; Codeine; and Ultram [tramadol]  Family History  Problem Relation Age of Onset  . Diabetes Mother   . Diabetes Father     Social History Social History  Substance Use Topics  . Smoking status: Former Smoker    Packs/day: 0.25    Types: Cigarettes  . Smokeless tobacco: Never Used     Comment: quit smoking 03/04/16  . Alcohol use No    Review of Systems Constitutional: No fever/chills Eyes: No visual changes. ENT: No sore throat. Cardiovascular: Denies chest pain. Respiratory: Denies shortness of  breath. Gastrointestinal: No abdominal pain.  No nausea, no vomiting.  No diarrhea.  No constipation. Genitourinary: Negative for dysuria. Musculoskeletal: Negative for back pain. Skin: Negative for rash. Neurological: Negative for headaches, focal weakness or numbness. Endocrine:Hypertension, hyperlipidemia, and hypothyroidism. Allergic/Immunilogical: Metastatic cancer of the lungs other sites. See medication list.  ____________________________________________   PHYSICAL EXAM:  VITAL SIGNS: ED Triage Vitals [05/27/16 1114]  Enc Vitals Group     BP 125/72     Pulse Rate 99     Resp 20     Temp 97.5 F (36.4 C)     Temp Source Oral     SpO2 98 %     Weight 79 lb (35.8 kg)  Height '5\' 3"'$  (1.6 m)     Head Circumference      Peak Flow      Pain Score 10     Pain Loc      Pain Edu?      Excl. in [place]?     Constitutional: Alert and oriented. Well appearing and in no acute distress. Eyes: Conjunctivae are normal. PERRL. EOMI. Head: Atraumatic. Nose: No congestion/rhinnorhea. Mouth/Throat: Mucous membranes are moist.  Oropharynx non-erythematous. Neck: No stridor.  No cervical spine tenderness to palpation. Hematological/Lymphatic/Immunilogical: No cervical lymphadenopathy. Cardiovascular: Normal rate, regular rhythm. Grossly normal heart sounds.  Good peripheral circulation. Respiratory: Normal respiratory effort.  No retractions. Lungs CTAB. Gastrointestinal: Soft and nontender. No distention. No abdominal bruits. No CVA tenderness. Musculoskeletal:No obvious deformity of the lumbar spine. Patient is moderate guarding palpation of L4-S1. Patient has some mild spasms on the right side with left lateral movements. Patient's straight-leg test.  Neurologic:  Normal speech and language. No gross focal neurologic deficits are appreciated. No gait instability. Skin:  Skin is warm, dry and intact. No rash noted. Psychiatric: Mood and affect are normal. Speech and behavior are  normal.  ____________________________________________   LABS (all labs ordered are listed, but only abnormal results are displayed)  Labs Reviewed - No data to display ____________________________________________  EKG   ____________________________________________  RADIOLOGY  There is a mild compression fracture endplate of L1 vertebral body which was not noticed on previous exam taken on 03/21/2016. There is disc space flattening noted L5-S1 consistent with patient symptomatic plane. ____________________________________________   PROCEDURES  Procedure(s) performed: None  Procedures  Critical Care performed: No  ____________________________________________   INITIAL IMPRESSION / ASSESSMENT AND PLAN / ED COURSE  Pertinent labs & imaging results that were available during my care of the patient were reviewed by me and considered in my medical decision making (see chart for details).  Acute back pain with spasms. X-ray reveals compression fracture of undetermined timeframe of L1. Discuss x-ray findings with patient and son. Advised to continue previous pain medications and start Flexeril as directed. Advised to follow-up with orthopedics by calling for an appointment on Monday morning.  Clinical Course    Patient states no relief status post taken tramadol one hour ago. Patient states she's take 1 mg of morphine daily secondary to her lung cancer and chemotherapy. She states she is not experiencing back spasms. We will give 5 mg of Flexeril prior to departure and will follow-up with her treating doctor/ orthopedics on Monday. ____________________________________________   FINAL CLINICAL IMPRESSION(S) / ED DIAGNOSES  Final diagnoses:  Strain of lumbar region, initial encounter      NEW MEDICATIONS STARTED DURING THIS VISIT:  New Prescriptions   CYCLOBENZAPRINE (FLEXERIL) 10 MG TABLET    Take 0.5 tablets (5 mg total) by mouth 2 (two) times daily with a meal.    CYCLOBENZAPRINE (FLEXERIL) 10 MG TABLET    Take 1 tablet (10 mg total) by mouth 2 (two) times daily with a meal.   OXYCODONE-ACETAMINOPHEN (ROXICET) 5-325 MG TABLET    Take 1 tablet by mouth every 8 (eight) hours as needed for moderate pain.     Note:  This document was prepared using Dragon voice recognition software and may include unintentional dictation errors.    Sable Feil, PA-C 05/27/16 [place], MD 05/27/16 [phone]

## 2016-05-27 NOTE — ED Triage Notes (Signed)
States she bending over to put [place] in oven and fell   Having lower back pain.

## 2016-05-27 NOTE — Discharge Instructions (Signed)
Advised follow-up for treating doctor or orthopedics by calling for an appointment on Monday.

## 2016-05-27 NOTE — ED Notes (Signed)
Pt taken in w/c out to the car

## 2016-05-30 ENCOUNTER — Other Ambulatory Visit: Payer: Self-pay | Admitting: *Deleted

## 2016-05-30 DIAGNOSIS — M4856XA Collapsed vertebra, not elsewhere classified, lumbar region, initial encounter for fracture: Secondary | ICD-10-CM | POA: Diagnosis not present

## 2016-05-30 NOTE — Telephone Encounter (Signed)
Per Dr Grayland Ormond no MS refill. He has agreed to give her # 30 Percocet 5/325 mg and if not improved she is to call back, she also needs to see ortho as instructed by ED MD. Declined Oxycodone stating she cannot take it, States she went to see ortho today and they offered her Oxycodone too which she declined and she was given muscle relaxer rx. She is going to call them for pain med

## 2016-05-30 NOTE — Telephone Encounter (Signed)
Called and states she hurt her back and is requesting a refill on Morphine. This is not on her med list and looking back through her chart, she was given MS 10 mg/ 5 ml by an ER MD on 03/12/16(this was discontinued on 03/15/16), she also was given Percocet by another ER MD on 11/25.  Please advise.

## 2016-05-31 ENCOUNTER — Ambulatory Visit (INDEPENDENT_AMBULATORY_CARE_PROVIDER_SITE_OTHER): Payer: Commercial Managed Care - HMO | Admitting: Family Medicine

## 2016-05-31 ENCOUNTER — Encounter: Payer: Self-pay | Admitting: Family Medicine

## 2016-05-31 ENCOUNTER — Other Ambulatory Visit: Payer: Self-pay | Admitting: Family Medicine

## 2016-05-31 VITALS — BP 122/73 | HR 95 | Temp 98.3°F | Wt 85.0 lb

## 2016-05-31 DIAGNOSIS — M545 Low back pain: Secondary | ICD-10-CM | POA: Diagnosis not present

## 2016-05-31 MED ORDER — CYCLOBENZAPRINE HCL 10 MG PO TABS: 10.0000 mg | ORAL_TABLET | Freq: Two times a day (BID) | ORAL | 0 refills | Status: DC

## 2016-05-31 NOTE — Telephone Encounter (Signed)
Previous rx refill was sent to [facility]. It needs to be sent to mail order instead.

## 2016-05-31 NOTE — Progress Notes (Signed)
   BP 122/73   Pulse 95   Temp 98.3 F (36.8 C)   Wt 85 lb (38.6 kg)   LMP  (Approximate)   SpO2 94%   BMI 15.06 kg/m    Subjective:    Patient ID: Melody Green, female    DOB: 08/15/1940, 75 y.o.   MRN: [id number]  HPI: Melody Green is a 75 y.o. female  Chief Complaint  Patient presents with  . Follow-up    follow up from ED visit for back pain. States she was lifiting a [place] on Thanksgiving and felt a sharp pain. Was given Oxycodone and Flexeril at the ED. States it's not improved at all.  . Medication Refill    she needs a refill on Nasonex   Patient presents for ER follow up for a back strain/possible vertebral fracture after lifting a [place] out of the oven. Saw orthopedics yesterday who reviewed hospital images and want to see her back in 3 weeks for follow up stating to continue flexeril and current pain regimen. She has been using a heat pad, resting, and taking oxycodone and flexeril with some relief. The pain is severe and not improving at all one week later.   Relevant past medical, surgical, family and social history reviewed and updated as indicated. Interim medical history since our last visit reviewed. Allergies and medications reviewed and updated.  Review of Systems  Constitutional: Negative.   HENT: Negative.   Respiratory: Negative.   Cardiovascular: Negative.   Gastrointestinal: Negative.   Genitourinary: Negative.   Skin: Negative.   Neurological: Negative.   Psychiatric/Behavioral: Negative.     Per HPI unless specifically indicated above     Objective:    BP 122/73   Pulse 95   Temp 98.3 F (36.8 C)   Wt 85 lb (38.6 kg)   LMP  (Approximate)   SpO2 94%   BMI 15.06 kg/m   Wt Readings from Last 3 Encounters:  05/31/16 85 lb (38.6 kg)  05/27/16 79 lb (35.8 kg)  05/19/16 83 lb 11.2 oz (38 kg)    Physical Exam  Constitutional: She is oriented to person, place, and time.  Very thin appearing  HENT:  Head: Atraumatic.  Eyes: Conjunctivae  are normal. No scleral icterus.  Neck: Normal range of motion. Neck supple.  Cardiovascular: Normal rate.   Pulmonary/Chest: Effort normal. No respiratory distress.  Musculoskeletal: She exhibits tenderness (over L spine).  Severely antalgic gait and movements  Neurological: She is alert and oriented to person, place, and time.  Skin: Skin is warm and dry.  Psychiatric: She has a normal mood and affect. Her behavior is normal.  Nursing note and vitals reviewed.     Assessment & Plan:   Problem List Items Addressed This Visit    None    Visit Diagnoses    Acute midline low back pain, with sciatica presence unspecified    -  Primary   Continue flexeril and pain medicines. Await Orthopedic follow up next month for further direction.    Relevant Medications   cyclobenzaprine (FLEXERIL) 10 MG tablet       Follow up plan: Return if symptoms worsen or fail to improve.

## 2016-06-06 NOTE — Progress Notes (Signed)
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  INTERVAL HISTORY: Patient returns to clinic today for further evaluation and consideration of cycle 4 of carboplatinum and etoposide. She continues to have chronic shortness of breath. She has had noticed increased abdominal swelling as well as bilateral peripheral edema. She continues to have weakness and fatigue, but this is relatively unchanged. She denies any fevers or illnesses. She denies any chest pain, cough, or hemoptysis. She has a good appetite. She has no nausea, vomiting, constipation, or diarrhea. She has no urinary complaints. Patient offers no further specific complaints.  REVIEW OF SYSTEMS:   Review of Systems  Constitutional: Positive for malaise/fatigue. Negative for fever and weight loss.  Eyes: Negative.  Negative for blurred vision and double vision.  Respiratory: Positive for shortness of breath. Negative for cough and hemoptysis.   Cardiovascular: Positive for leg swelling. Negative for chest pain.  Gastrointestinal: Negative.  Negative for abdominal pain.  Genitourinary: Negative.   Musculoskeletal: Negative.   Neurological: Positive for weakness. Negative for focal weakness and headaches.  Psychiatric/Behavioral: Negative.  The patient is not nervous/anxious.     As per HPI. Otherwise, a complete review of systems is negative.  PAST MEDICAL HISTORY: Past Medical History:  Diagnosis Date  . 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 Cancer ([place]) 03/10/2016   lung  . Chronic kidney disease   . Collagenous colitis   .  COPD (chronic obstructive pulmonary disease) ([place])   . DDD (degenerative disc disease), cervical   . Depression   . Hernia of abdominal cavity   . Hyperlipidemia   . Osteoporosis   . Pneumonia   . Stroke ([place])   . Tobacco abuse   . Urine incontinence   . Vitamin B 12 deficiency     PAST SURGICAL HISTORY: Past Surgical History:  Procedure Laterality Date  . ABDOMINAL AORTIC ANEURYSM REPAIR    . ABDOMINAL HYSTERECTOMY    . APPENDECTOMY    . BACK SURGERY  [phone]  . CHOLECYSTECTOMY    . FLEXIBLE BRONCHOSCOPY N/A 03/17/2016   Procedure: FLEXIBLE BRONCHOSCOPY;  Surgeon: Wilhelmina Mcardle, MD;  Location: [facility] ORS;  Service: Pulmonary;  Laterality: N/A;  . PERIPHERAL VASCULAR CATHETERIZATION N/A 04/03/2016   Procedure: Glori Luis Cath Insertion;  Surgeon: Algernon Huxley, MD;  Location: [place] [facility];  Service: Cardiovascular;  Laterality: N/A;    FAMILY HISTORY: Family History  Problem Relation Age of Onset  . Diabetes Mother   . Diabetes Father     ADVANCED DIRECTIVES (Y/N):  N  HEALTH MAINTENANCE: Social History  Substance Use Topics  . Smoking status: Former Smoker    Packs/day: 0.25    Types: Cigarettes  . Smokeless tobacco: Never Used     Comment: quit smoking 03/04/16  . Alcohol use No     Colonoscopy:  PAP:  Bone density:  Lipid panel:  Allergies  Allergen Reactions  . Hydrocodone Itching and Rash  . Codeine Nausea And Vomiting  . Ultram [Tramadol] Itching    Current Outpatient Prescriptions  Medication Sig Dispense Refill  . albuterol (PROVENTIL HFA) 108 (90 Base) MCG/ACT inhaler Inhale  2 puffs into the lungs every 4 (four) hours as needed for wheezing or shortness of breath. 1 Inhaler 0  . atorvastatin (LIPITOR) 20 MG tablet Take 20 mg by mouth at bedtime.    . bisacodyl (DULCOLAX) 5 MG EC tablet Take 5 mg by mouth daily as needed for moderate constipation.    . calcium carbonate (OS-CAL) 600 MG TABS tablet Take 600 mg by mouth at bedtime.     .  clopidogrel (PLAVIX) 75 MG tablet Take 75 mg by mouth at bedtime.    . cyanocobalamin (,VITAMIN B-12,) 1000 MCG/ML injection Inject 1,000 mcg into the muscle every 30 (thirty) days.    . cyclobenzaprine (FLEXERIL) 10 MG tablet Take 1 tablet (10 mg total) by mouth 2 (two) times daily with a meal. 30 tablet 0  . levothyroxine (SYNTHROID, LEVOTHROID) 75 MCG tablet TAKE 1 TABLET EVERY DAY BEFORE BREAKFAST 90 tablet 1  . lidocaine (LIDODERM) 5 % Place 1 patch onto the skin daily. Remove & Discard patch within 12 hours or as directed by MD 30 patch 0  . lidocaine-prilocaine (EMLA) cream Apply to port site 1 hour before port is accessed each time 30 g 3  . LORazepam (ATIVAN) 1 MG tablet Take 1-2 tablets (1-2 mg total) by mouth daily as needed for anxiety. 60 tablet 1  . mometasone (NASONEX) 50 MCG/ACT nasal spray Place 2 sprays into the nose daily as needed (for rhinitis).     Marland Kitchen omeprazole (PRILOSEC) 20 MG capsule TAKE 1 CAPSULE (20 MG TOTAL) BY MOUTH DAILY. 30 capsule 3  . ondansetron (ZOFRAN) 8 MG tablet Take 1 tablet (8 mg total) by mouth 2 (two) times daily as needed for refractory nausea / vomiting. 30 tablet 2  . oxyCODONE-acetaminophen (ROXICET) 5-325 MG tablet Take 1 tablet by mouth every 8 (eight) hours as needed for moderate pain. 12 tablet 0  . prochlorperazine (COMPAZINE) 10 MG tablet Take 1 tablet (10 mg total) by mouth every 6 (six) hours as needed (Nausea or vomiting). 30 tablet 1  . Morphine Sulfate (MORPHINE CONCENTRATE) 10 mg / 0.5 ml concentrated solution Take 1 mL (20 mg total) by mouth every 6 (six) hours as needed for severe pain. 30 mL 0   No current facility-administered medications for this visit.    Facility-Administered Medications Ordered in Other Visits  Medication Dose Route Frequency Provider Last Rate Last Dose  . etoposide (VEPESID) 100 mg in sodium chloride 0.9 % 250 mL chemo infusion  80 mg/m2 (Treatment Plan Recorded) Intravenous Once Lloyd Huger, MD   Stopped at  06/09/16 1533  . heparin lock flush 100 unit/mL  500 Units Intracatheter Once PRN Lloyd Huger, MD      . pegfilgrastim (NEULASTA ONPRO KIT) injection 6 mg  6 mg Subcutaneous Once Lloyd Huger, MD        OBJECTIVE: Vitals:   06/07/16 0957  BP: 117/74  Pulse: 92  Resp: 20  Temp: 97.8 F (36.6 C)     Body mass index is 15.7 kg/m.    ECOG FS:1 - Symptomatic but completely ambulatory  General: Thin, no acute distress. Eyes: Pink conjunctiva, anicteric sclera. Lungs: Clear to auscultation bilaterally. Heart: Regular rate and rhythm. No rubs, murmurs, or gallops. Abdomen: Mildly distended, nontender.. Musculoskeletal: No edema, cyanosis, or clubbing. Neuro: Alert, answering all questions appropriately. Cranial nerves grossly intact. Skin: No rashes or petechiae noted. Psych: Normal affect.   LAB RESULTS:  Lab Results  Component Value Date   NA 132 (  L) 06/07/2016   K 3.9 06/07/2016   CL 100 (L) 06/07/2016   CO2 25 06/07/2016   GLUCOSE 97 06/07/2016   BUN 13 06/07/2016   CREATININE 0.86 06/07/2016   CALCIUM 7.9 (L) 06/07/2016   PROT 5.6 (L) 06/07/2016   ALBUMIN 3.0 (L) 06/07/2016   AST 21 06/07/2016   ALT 9 (L) 06/07/2016   ALKPHOS 125 06/07/2016   BILITOT 0.3 06/07/2016   GFRNONAA >60 06/07/2016   GFRAA >60 06/07/2016    Lab Results  Component Value Date   WBC 24.9 (H) 06/07/2016   NEUTROABS 23.2 (H) 06/07/2016   HGB 9.8 (L) 06/07/2016   HCT 29.4 (L) 06/07/2016   MCV 98.2 06/07/2016   PLT 318 06/07/2016     STUDIES: Dg Lumbar Spine Complete  Result Date: 05/27/2016 CLINICAL DATA:  Back pain after lifting the [place] out of the oven EXAM: LUMBAR SPINE - COMPLETE 4+ VIEW COMPARISON:  03/21/2016 FINDINGS: Five views of the lumbar spine submitted. There is diffuse osteopenia. Prior vertebroplasty is noted at T11 and L3 level. There is interval mild compression fracture upper endplate of L1 vertebral body. Acute fracture cannot be excluded. Disc space  flattening is noted at L5-S1 level. Mild disc space flattening at L2-L3 level. IMPRESSION: There is diffuse osteopenia. Prior vertebroplasty is noted at T11 and L3 level. There is interval mild compression fracture upper endplate of L1 vertebral body. Acute fracture cannot be excluded. Disc space flattening is noted at L5-S1 level. Mild disc space flattening at L2-L3 level. Electronically Signed   By: Lahoma Crocker M.D.   On: 05/27/2016 12:37   US Abdomen Limited  Result Date: 06/08/2016 CLINICAL DATA:  History of lung carcinoma with metastatic disease and abdominal distention EXAM: LIMITED ABDOMEN ULTRASOUND FOR ASCITES TECHNIQUE: Limited ultrasound survey for ascites was performed in all four abdominal quadrants. COMPARISON:  None. FINDINGS: Four-quadrant scanning of the abdomen shows no evidence of ascites. The scheduled paracentesis was canceled. IMPRESSION: No evidence of significant ascites. Electronically Signed   By: Inez Catalina M.D.   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 Patient noted to have bilateral disease on bronchoscopy. PET scan results reviewed independently with bilateral lung metastasis as well as a hypermetabolic right sacral bone metastasis. MRI the brain is negative for intracranial metastasis. Proceed with cycle43, day 1 of carboplatinum and etoposide today. Return to clinic in 1 and 2 days for etoposide only and then in 6 weeks for repeat imaging and consideration of cycle 5 if necessary.  2. Headaches: MRI negative for metastasis, monitor. 3. Pain: Patient does not complain of pain today. Monitor. 4. Weight loss: Likely multifactorial including possible underlying malignancy. Consider Megace in the future. 5. Thrombocytopenia: Resolved. Secondary chemotherapy, monitor.  6. Leukocytosis: Likely  secondary to Neulasta, monitor. 7. Abdominal swelling: Ultrasound negative for ascites. Monitor.   Patient expressed understanding and was in agreement with this plan. She also understands that She can call clinic at any time with any questions, concerns, or complaints.   Primary cancer of left lung metastatic to other site [facility])   Staging form: Lung, AJCC 7th Edition   - Clinical stage from 03/12/2016: Stage IV (T3, N3, M1a) - Signed by Lloyd Huger, MD on 03/26/2016  Lloyd Huger, MD   06/09/2016 3:12 PM

## 2016-06-07 ENCOUNTER — Inpatient Hospital Stay: Payer: Commercial Managed Care - HMO | Attending: Oncology

## 2016-06-07 ENCOUNTER — Inpatient Hospital Stay: Payer: Commercial Managed Care - HMO

## 2016-06-07 ENCOUNTER — Inpatient Hospital Stay (HOSPITAL_BASED_OUTPATIENT_CLINIC_OR_DEPARTMENT_OTHER): Payer: Commercial Managed Care - HMO | Admitting: Oncology

## 2016-06-07 VITALS — BP 117/74 | HR 92 | Temp 97.8°F | Resp 20 | Wt 88.6 lb

## 2016-06-07 DIAGNOSIS — Z7689 Persons encountering health services in other specified circumstances: Secondary | ICD-10-CM

## 2016-06-07 DIAGNOSIS — R32 Unspecified urinary incontinence: Secondary | ICD-10-CM | POA: Diagnosis not present

## 2016-06-07 DIAGNOSIS — Z7951 Long term (current) use of inhaled steroids: Secondary | ICD-10-CM

## 2016-06-07 DIAGNOSIS — R531 Weakness: Secondary | ICD-10-CM | POA: Insufficient documentation

## 2016-06-07 DIAGNOSIS — Z9049 Acquired absence of other specified parts of digestive tract: Secondary | ICD-10-CM

## 2016-06-07 DIAGNOSIS — F329 Major depressive disorder, single episode, unspecified: Secondary | ICD-10-CM | POA: Insufficient documentation

## 2016-06-07 DIAGNOSIS — R1900 Intra-abdominal and pelvic swelling, mass and lump, unspecified site: Secondary | ICD-10-CM

## 2016-06-07 DIAGNOSIS — J449 Chronic obstructive pulmonary disease, unspecified: Secondary | ICD-10-CM | POA: Insufficient documentation

## 2016-06-07 DIAGNOSIS — K458 Other specified abdominal hernia without obstruction or gangrene: Secondary | ICD-10-CM

## 2016-06-07 DIAGNOSIS — M818 Other osteoporosis without current pathological fracture: Secondary | ICD-10-CM

## 2016-06-07 DIAGNOSIS — D72829 Elevated white blood cell count, unspecified: Secondary | ICD-10-CM | POA: Insufficient documentation

## 2016-06-07 DIAGNOSIS — Z8701 Personal history of pneumonia (recurrent): Secondary | ICD-10-CM

## 2016-06-07 DIAGNOSIS — C7801 Secondary malignant neoplasm of right lung: Secondary | ICD-10-CM

## 2016-06-07 DIAGNOSIS — M5030 Other cervical disc degeneration, unspecified cervical region: Secondary | ICD-10-CM

## 2016-06-07 DIAGNOSIS — R0602 Shortness of breath: Secondary | ICD-10-CM | POA: Insufficient documentation

## 2016-06-07 DIAGNOSIS — E538 Deficiency of other specified B group vitamins: Secondary | ICD-10-CM | POA: Insufficient documentation

## 2016-06-07 DIAGNOSIS — R51 Headache: Secondary | ICD-10-CM

## 2016-06-07 DIAGNOSIS — N189 Chronic kidney disease, unspecified: Secondary | ICD-10-CM | POA: Diagnosis not present

## 2016-06-07 DIAGNOSIS — R5383 Other fatigue: Secondary | ICD-10-CM

## 2016-06-07 DIAGNOSIS — E785 Hyperlipidemia, unspecified: Secondary | ICD-10-CM

## 2016-06-07 DIAGNOSIS — Z79899 Other long term (current) drug therapy: Secondary | ICD-10-CM

## 2016-06-07 DIAGNOSIS — M549 Dorsalgia, unspecified: Secondary | ICD-10-CM

## 2016-06-07 DIAGNOSIS — Z8781 Personal history of (healed) traumatic fracture: Secondary | ICD-10-CM

## 2016-06-07 DIAGNOSIS — Z5111 Encounter for antineoplastic chemotherapy: Secondary | ICD-10-CM | POA: Diagnosis not present

## 2016-06-07 DIAGNOSIS — R609 Edema, unspecified: Secondary | ICD-10-CM

## 2016-06-07 DIAGNOSIS — F17210 Nicotine dependence, cigarettes, uncomplicated: Secondary | ICD-10-CM | POA: Diagnosis not present

## 2016-06-07 DIAGNOSIS — R634 Abnormal weight loss: Secondary | ICD-10-CM | POA: Insufficient documentation

## 2016-06-07 DIAGNOSIS — Z87891 Personal history of nicotine dependence: Secondary | ICD-10-CM | POA: Insufficient documentation

## 2016-06-07 DIAGNOSIS — I714 Abdominal aortic aneurysm, without rupture: Secondary | ICD-10-CM | POA: Diagnosis not present

## 2016-06-07 DIAGNOSIS — C7951 Secondary malignant neoplasm of bone: Secondary | ICD-10-CM | POA: Diagnosis not present

## 2016-06-07 DIAGNOSIS — Z8673 Personal history of transient ischemic attack (TIA), and cerebral infarction without residual deficits: Secondary | ICD-10-CM

## 2016-06-07 DIAGNOSIS — C3492 Malignant neoplasm of unspecified part of left bronchus or lung: Secondary | ICD-10-CM

## 2016-06-07 DIAGNOSIS — C3402 Malignant neoplasm of left main bronchus: Secondary | ICD-10-CM | POA: Insufficient documentation

## 2016-06-07 LAB — CBC WITH DIFFERENTIAL/PLATELET
BASOS ABS: 0.1 10*3/uL (ref 0–0.1)
BASOS PCT: 0 %
Eosinophils Absolute: 0 10*3/uL (ref 0–0.7)
Eosinophils Relative: 0 %
HEMATOCRIT: 29.4 % — AB (ref 35.0–47.0)
HEMOGLOBIN: 9.8 g/dL — AB (ref 12.0–16.0)
Lymphocytes Relative: 3 %
Lymphs Abs: 0.9 10*3/uL — ABNORMAL LOW (ref 1.0–3.6)
MCH: 32.6 pg (ref 26.0–34.0)
MCHC: 33.2 g/dL (ref 32.0–36.0)
MCV: 98.2 fL (ref 80.0–100.0)
Monocytes Absolute: 0.7 10*3/uL (ref 0.2–0.9)
Monocytes Relative: 3 %
NEUTROS ABS: 23.2 10*3/uL — AB (ref 1.4–6.5)
NEUTROS PCT: 94 %
Platelets: 318 10*3/uL (ref 150–440)
RBC: 2.99 MIL/uL — ABNORMAL LOW (ref 3.80–5.20)
RDW: 18.1 % — ABNORMAL HIGH (ref 11.5–14.5)
WBC: 24.9 10*3/uL — AB (ref 3.6–11.0)

## 2016-06-07 LAB — COMPREHENSIVE METABOLIC PANEL
ALBUMIN: 3 g/dL — AB (ref 3.5–5.0)
ALK PHOS: 125 U/L (ref 38–126)
ALT: 9 U/L — ABNORMAL LOW (ref 14–54)
AST: 21 U/L (ref 15–41)
Anion gap: 7 (ref 5–15)
BILIRUBIN TOTAL: 0.3 mg/dL (ref 0.3–1.2)
BUN: 13 mg/dL (ref 6–20)
CO2: 25 mmol/L (ref 22–32)
Calcium: 7.9 mg/dL — ABNORMAL LOW (ref 8.9–10.3)
Chloride: 100 mmol/L — ABNORMAL LOW (ref 101–111)
Creatinine, Ser: 0.86 mg/dL (ref 0.44–1.00)
GFR calc Af Amer: >60 mL/min (ref >60)
GFR calc non Af Amer: >60 mL/min (ref >60)
GLUCOSE: 97 mg/dL (ref 65–99)
POTASSIUM: 3.9 mmol/L (ref 3.5–5.1)
SODIUM: 132 mmol/L — AB (ref 135–145)
TOTAL PROTEIN: 5.6 g/dL — AB (ref 6.5–8.1)

## 2016-06-07 MED ORDER — HEPARIN SOD (PORK) LOCK FLUSH 100 UNIT/ML IV SOLN
500.0000 [IU] | Freq: Once | INTRAVENOUS | Status: AC
  Administered 2016-06-07: 500 [IU] via INTRAVENOUS

## 2016-06-07 MED ORDER — SODIUM CHLORIDE 0.9 % IV SOLN
261.0000 mg | Freq: Once | INTRAVENOUS | Status: AC
  Administered 2016-06-07: 260 mg via INTRAVENOUS
  Filled 2016-06-07: qty 26

## 2016-06-07 MED ORDER — SODIUM CHLORIDE 0.9 % IV SOLN
80.0000 mg/m2 | Freq: Once | INTRAVENOUS | Status: AC
  Administered 2016-06-07: 100 mg via INTRAVENOUS
  Filled 2016-06-07: qty 5

## 2016-06-07 MED ORDER — SODIUM CHLORIDE 0.9% FLUSH
10.0000 mL | INTRAVENOUS | Status: DC | PRN
  Filled 2016-06-07: qty 10

## 2016-06-07 MED ORDER — SODIUM CHLORIDE 0.9 % IV SOLN
Freq: Once | INTRAVENOUS | Status: AC
  Administered 2016-06-07: 11:00:00 via INTRAVENOUS
  Filled 2016-06-07: qty 1000

## 2016-06-07 MED ORDER — HEPARIN SOD (PORK) LOCK FLUSH 100 UNIT/ML IV SOLN
500.0000 [IU] | Freq: Once | INTRAVENOUS | Status: AC | PRN
  Filled 2016-06-07: qty 5

## 2016-06-07 MED ORDER — PALONOSETRON HCL INJECTION 0.25 MG/5ML
0.2500 mg | Freq: Once | INTRAVENOUS | Status: AC
  Administered 2016-06-07: 0.25 mg via INTRAVENOUS
  Filled 2016-06-07: qty 5

## 2016-06-07 MED ORDER — SODIUM CHLORIDE 0.9 % IJ SOLN
10.0000 mL | Freq: Once | INTRAMUSCULAR | Status: AC
  Administered 2016-06-07: 10 mL via INTRAVENOUS
  Filled 2016-06-07: qty 10

## 2016-06-07 MED ORDER — MORPHINE SULFATE (CONCENTRATE) 10 MG /0.5 ML PO SOLN: 20.0000 mg | Freq: Four times a day (QID) | ORAL | 0 refills | Status: DC | PRN

## 2016-06-07 MED ORDER — DEXAMETHASONE SODIUM PHOSPHATE 10 MG/ML IJ SOLN
10.0000 mg | Freq: Once | INTRAMUSCULAR | Status: AC
  Administered 2016-06-07: 10 mg via INTRAVENOUS
  Filled 2016-06-07: qty 1

## 2016-06-07 NOTE — Progress Notes (Signed)
Patient here today for follow up and treatment consideration regarding lung cancer. Patient complaints of continued back pain, seen by ortho last week. Patient also reports her "stomach is swollen, getting worse". Family reports swelling to feet and ankles as well.

## 2016-06-07 NOTE — Discharge Instructions (Signed)
Paracentesis, Care After Introduction Refer to this sheet in the next few weeks. These instructions provide you with information about caring for yourself after your procedure. Your health care provider may also give you more specific instructions. Your treatment has been planned according to current medical practices, but problems sometimes occur. Call your health care provider if you have any problems or questions after your procedure. What can I expect after the procedure? After your procedure, it is common to have a small amount of clear fluid coming from the puncture site. Follow these instructions at home:  Return to your normal activities as told by your health care provider. Ask your health care provider what activities are safe for you.  Take over-the-counter and prescription medicines only as told by your health care provider.  Do not take baths, swim, or use a hot tub until your health care provider approves.  Follow instructions from your health care provider about:  How to take care of your puncture site.  When and how you should change your bandage (dressing).  When you should remove your dressing.  Check your puncture area every day signs of infection. Watch for:  Redness, swelling, or pain.  Fluid, blood, or pus.  Keep all follow-up visits as told by your health care provider. This is important. Contact a health care provider if:  You have redness, swelling, or pain at your puncture site.  You start to have more clear fluid coming from your puncture site.  You have blood or pus coming from your puncture site.  You have chills.  You have a fever. Get help right away if:  You develop chest pain or shortness of breath.  You develop increasing pain, discomfort, or swelling in your abdomen.  You feel dizzy or light-headed or you pass out. This information is not intended to replace advice given to you by your health care provider. Make sure you discuss any  questions you have with your health care provider. Document Released: 11/03/2014 Document Revised: 11/25/2015 Document Reviewed: 09/01/2014  2017 Elsevier

## 2016-06-08 ENCOUNTER — Inpatient Hospital Stay: Payer: Commercial Managed Care - HMO

## 2016-06-08 ENCOUNTER — Ambulatory Visit
Admission: RE | Admit: 2016-06-08 | Discharge: 2016-06-08 | Disposition: A | Discharge status: Home / Self Care | Payer: Commercial Managed Care - HMO | Source: Ambulatory Visit | Attending: Oncology | Admitting: Oncology

## 2016-06-08 ENCOUNTER — Other Ambulatory Visit: Payer: Self-pay | Admitting: Oncology

## 2016-06-08 VITALS — BP 145/85 | HR 88 | Resp 20

## 2016-06-08 DIAGNOSIS — C3492 Malignant neoplasm of unspecified part of left bronchus or lung: Secondary | ICD-10-CM

## 2016-06-08 DIAGNOSIS — Z5111 Encounter for antineoplastic chemotherapy: Secondary | ICD-10-CM | POA: Diagnosis not present

## 2016-06-08 DIAGNOSIS — D72829 Elevated white blood cell count, unspecified: Secondary | ICD-10-CM | POA: Diagnosis not present

## 2016-06-08 DIAGNOSIS — C7951 Secondary malignant neoplasm of bone: Secondary | ICD-10-CM | POA: Diagnosis not present

## 2016-06-08 DIAGNOSIS — R51 Headache: Secondary | ICD-10-CM | POA: Diagnosis not present

## 2016-06-08 DIAGNOSIS — R1900 Intra-abdominal and pelvic swelling, mass and lump, unspecified site: Secondary | ICD-10-CM | POA: Diagnosis not present

## 2016-06-08 DIAGNOSIS — Z7689 Persons encountering health services in other specified circumstances: Secondary | ICD-10-CM | POA: Diagnosis not present

## 2016-06-08 DIAGNOSIS — R140 Abdominal distension (gaseous): Secondary | ICD-10-CM | POA: Diagnosis not present

## 2016-06-08 DIAGNOSIS — C7801 Secondary malignant neoplasm of right lung: Secondary | ICD-10-CM | POA: Diagnosis not present

## 2016-06-08 DIAGNOSIS — R634 Abnormal weight loss: Secondary | ICD-10-CM | POA: Diagnosis not present

## 2016-06-08 DIAGNOSIS — C3402 Malignant neoplasm of left main bronchus: Secondary | ICD-10-CM | POA: Diagnosis not present

## 2016-06-08 MED ORDER — HEPARIN SOD (PORK) LOCK FLUSH 100 UNIT/ML IV SOLN
500.0000 [IU] | Freq: Once | INTRAVENOUS | Status: AC | PRN
  Administered 2016-06-08: 500 [IU]
  Filled 2016-06-08: qty 5

## 2016-06-08 MED ORDER — SODIUM CHLORIDE 0.9 % IV SOLN
Freq: Once | INTRAVENOUS | Status: AC
  Administered 2016-06-08: 14:00:00 via INTRAVENOUS
  Filled 2016-06-08: qty 1000

## 2016-06-08 MED ORDER — SODIUM CHLORIDE 0.9 % IV SOLN
80.0000 mg/m2 | Freq: Once | INTRAVENOUS | Status: AC
  Administered 2016-06-08: 100 mg via INTRAVENOUS
  Filled 2016-06-08: qty 5

## 2016-06-08 MED ORDER — DEXAMETHASONE SODIUM PHOSPHATE 10 MG/ML IJ SOLN
10.0000 mg | Freq: Once | INTRAMUSCULAR | Status: AC
Start: 2016-06-08 — End: 2016-06-08
  Administered 2016-06-08: 10 mg via INTRAVENOUS
  Filled 2016-06-08: qty 1

## 2016-06-09 ENCOUNTER — Inpatient Hospital Stay: Payer: Commercial Managed Care - HMO

## 2016-06-09 DIAGNOSIS — D72829 Elevated white blood cell count, unspecified: Secondary | ICD-10-CM | POA: Diagnosis not present

## 2016-06-09 DIAGNOSIS — R634 Abnormal weight loss: Secondary | ICD-10-CM | POA: Diagnosis not present

## 2016-06-09 DIAGNOSIS — C3402 Malignant neoplasm of left main bronchus: Secondary | ICD-10-CM | POA: Diagnosis not present

## 2016-06-09 DIAGNOSIS — R1900 Intra-abdominal and pelvic swelling, mass and lump, unspecified site: Secondary | ICD-10-CM | POA: Diagnosis not present

## 2016-06-09 DIAGNOSIS — Z5111 Encounter for antineoplastic chemotherapy: Secondary | ICD-10-CM | POA: Diagnosis not present

## 2016-06-09 DIAGNOSIS — R51 Headache: Secondary | ICD-10-CM | POA: Diagnosis not present

## 2016-06-09 DIAGNOSIS — C3492 Malignant neoplasm of unspecified part of left bronchus or lung: Secondary | ICD-10-CM

## 2016-06-09 DIAGNOSIS — C7801 Secondary malignant neoplasm of right lung: Secondary | ICD-10-CM | POA: Diagnosis not present

## 2016-06-09 DIAGNOSIS — Z7689 Persons encountering health services in other specified circumstances: Secondary | ICD-10-CM | POA: Diagnosis not present

## 2016-06-09 DIAGNOSIS — C7951 Secondary malignant neoplasm of bone: Secondary | ICD-10-CM | POA: Diagnosis not present

## 2016-06-09 MED ORDER — PEGFILGRASTIM 6 MG/0.6ML ~~LOC~~ PSKT
6.0000 mg | PREFILLED_SYRINGE | Freq: Once | SUBCUTANEOUS | Status: AC
  Administered 2016-06-09: 6 mg via SUBCUTANEOUS
  Filled 2016-06-09: qty 0.6

## 2016-06-09 MED ORDER — SODIUM CHLORIDE 0.9 % IV SOLN
80.0000 mg/m2 | Freq: Once | INTRAVENOUS | Status: AC
  Administered 2016-06-09: 100 mg via INTRAVENOUS
  Filled 2016-06-09: qty 5

## 2016-06-09 MED ORDER — DEXAMETHASONE SODIUM PHOSPHATE 10 MG/ML IJ SOLN
10.0000 mg | Freq: Once | INTRAMUSCULAR | Status: AC
  Administered 2016-06-09: 10 mg via INTRAVENOUS
  Filled 2016-06-09: qty 1

## 2016-06-09 MED ORDER — HEPARIN SOD (PORK) LOCK FLUSH 100 UNIT/ML IV SOLN
500.0000 [IU] | Freq: Once | INTRAVENOUS | Status: AC | PRN
Start: 2016-06-09 — End: 2016-06-09
  Administered 2016-06-09: 500 [IU]
  Filled 2016-06-09: qty 5

## 2016-06-09 MED ORDER — SODIUM CHLORIDE 0.9 % IV SOLN
Freq: Once | INTRAVENOUS | Status: AC
  Administered 2016-06-09: 14:00:00 via INTRAVENOUS
  Filled 2016-06-09: qty 1000

## 2016-06-12 ENCOUNTER — Telehealth: Payer: Self-pay | Admitting: *Deleted

## 2016-06-12 DIAGNOSIS — Z79899 Other long term (current) drug therapy: Principal | ICD-10-CM

## 2016-06-12 DIAGNOSIS — Z5181 Encounter for therapeutic drug level monitoring: Secondary | ICD-10-CM

## 2016-06-12 MED ORDER — FUROSEMIDE 20 MG PO TABS: 20.0000 mg | ORAL_TABLET | Freq: Every day | ORAL | 1 refills | Status: DC | PRN

## 2016-06-12 NOTE — Telephone Encounter (Signed)
Lasix 20 mg daily as needed, check met b in 2 - 3 weeks. Patient agrees to pick up rx and repeated back to me to come for lab on the 28th as she was writing it down and afterwards

## 2016-06-12 NOTE — Telephone Encounter (Signed)
Called to ask what can be done about the edema  in her legs, abdomen and feet. States they are tight and painful. Please advise

## 2016-06-15 DIAGNOSIS — M4856XA Collapsed vertebra, not elsewhere classified, lumbar region, initial encounter for fracture: Secondary | ICD-10-CM | POA: Diagnosis not present

## 2016-06-16 ENCOUNTER — Other Ambulatory Visit: Payer: Self-pay | Admitting: *Deleted

## 2016-06-16 NOTE — Telephone Encounter (Signed)
Wants a a refill of her pain med, but she has morphine which she was given 12/6 and I explained to her to use the morphine and we are not refilling to Oxycodone ordered by another doctor before we ordered the MS. SHe expressed understanding to use the MS

## 2016-06-27 ENCOUNTER — Telehealth: Payer: Self-pay | Admitting: *Deleted

## 2016-06-27 ENCOUNTER — Encounter: Payer: Self-pay | Admitting: Oncology

## 2016-06-27 NOTE — Telephone Encounter (Signed)
Called to report that she thinks the MS is causing confusion and insomnia. She is not sleeping at night and does not want to go to bed. She is also showing signs of confusion and trying to leave her sons home stating she is supposed to be going home and she lives with him. Please advise

## 2016-06-27 NOTE — Telephone Encounter (Signed)
Hold narcotics to see if that helps.  She may need a repeat MRI of her brain if no improvement.

## 2016-06-27 NOTE — Telephone Encounter (Signed)
PC to [facility] Informed of VO Dr Grayland Ormond to hold narcotics for now and see how she does. May need to repeat MRI of head. She repeated back to me to stop the Morphine

## 2016-06-27 NOTE — Telephone Encounter (Signed)
Phone call made to pt's daughter in law.

## 2016-06-29 ENCOUNTER — Telehealth: Payer: Self-pay | Admitting: *Deleted

## 2016-06-29 ENCOUNTER — Other Ambulatory Visit: Payer: Self-pay

## 2016-06-29 ENCOUNTER — Inpatient Hospital Stay: Payer: Commercial Managed Care - HMO

## 2016-06-29 DIAGNOSIS — C3402 Malignant neoplasm of left main bronchus: Secondary | ICD-10-CM | POA: Diagnosis not present

## 2016-06-29 DIAGNOSIS — R634 Abnormal weight loss: Secondary | ICD-10-CM | POA: Diagnosis not present

## 2016-06-29 DIAGNOSIS — C7951 Secondary malignant neoplasm of bone: Secondary | ICD-10-CM | POA: Diagnosis not present

## 2016-06-29 DIAGNOSIS — Z5181 Encounter for therapeutic drug level monitoring: Secondary | ICD-10-CM

## 2016-06-29 DIAGNOSIS — R51 Headache: Secondary | ICD-10-CM | POA: Diagnosis not present

## 2016-06-29 DIAGNOSIS — Z79899 Other long term (current) drug therapy: Principal | ICD-10-CM

## 2016-06-29 DIAGNOSIS — Z7689 Persons encountering health services in other specified circumstances: Secondary | ICD-10-CM | POA: Diagnosis not present

## 2016-06-29 DIAGNOSIS — D72829 Elevated white blood cell count, unspecified: Secondary | ICD-10-CM | POA: Diagnosis not present

## 2016-06-29 DIAGNOSIS — R1900 Intra-abdominal and pelvic swelling, mass and lump, unspecified site: Secondary | ICD-10-CM | POA: Diagnosis not present

## 2016-06-29 DIAGNOSIS — Z5111 Encounter for antineoplastic chemotherapy: Secondary | ICD-10-CM | POA: Diagnosis not present

## 2016-06-29 DIAGNOSIS — C7801 Secondary malignant neoplasm of right lung: Secondary | ICD-10-CM | POA: Diagnosis not present

## 2016-06-29 LAB — BASIC METABOLIC PANEL
ANION GAP: 8 (ref 5–15)
BUN: 19 mg/dL (ref 6–20)
CHLORIDE: 105 mmol/L (ref 101–111)
CO2: 23 mmol/L (ref 22–32)
Calcium: 8.3 mg/dL — ABNORMAL LOW (ref 8.9–10.3)
Creatinine, Ser: 1.1 mg/dL — ABNORMAL HIGH (ref 0.44–1.00)
GFR calc Af Amer: 55 mL/min — ABNORMAL LOW (ref >60)
GFR, EST NON AFRICAN AMERICAN: 48 mL/min — AB (ref >60)
GLUCOSE: 116 mg/dL — AB (ref 65–99)
POTASSIUM: 2.7 mmol/L — AB (ref 3.5–5.1)
Sodium: 136 mmol/L (ref 135–145)

## 2016-06-29 MED ORDER — POTASSIUM CHLORIDE CRYS ER 20 MEQ PO TBCR: 20.0000 meq | EXTENDED_RELEASE_TABLET | Freq: Two times a day (BID) | ORAL | 1 refills | Status: DC

## 2016-06-29 NOTE — Telephone Encounter (Signed)
Critical result of potassium 2.7. Next appt scheduled for 07/19/16. Please advise.

## 2016-06-29 NOTE — Telephone Encounter (Signed)
Spoke with pt's son, Jeneen Rinks, and informed him that pt's potassium level is low and will need IV potassium. Jeneen Rinks was instructed to bring pt in tomorrow for IV potassium at 10am. Also, informed pt's son that prescription for oral potassium will be called into pharmacy for pt to start taking. Jeneen Rinks verbalized understanding. Prescription escribed and orders have been placed for IV potassium.

## 2016-06-29 NOTE — Telephone Encounter (Signed)
Would like her to come in for 40 mg IV potassium at next available chair. Please insure patient is also on oral potassium supplementation.

## 2016-06-30 ENCOUNTER — Inpatient Hospital Stay: Payer: Commercial Managed Care - HMO

## 2016-06-30 VITALS — BP 132/89 | HR 96 | Temp 97.1°F | Resp 20

## 2016-06-30 DIAGNOSIS — Z7689 Persons encountering health services in other specified circumstances: Secondary | ICD-10-CM | POA: Diagnosis not present

## 2016-06-30 DIAGNOSIS — R1900 Intra-abdominal and pelvic swelling, mass and lump, unspecified site: Secondary | ICD-10-CM | POA: Diagnosis not present

## 2016-06-30 DIAGNOSIS — C7951 Secondary malignant neoplasm of bone: Secondary | ICD-10-CM | POA: Diagnosis not present

## 2016-06-30 DIAGNOSIS — R634 Abnormal weight loss: Secondary | ICD-10-CM | POA: Diagnosis not present

## 2016-06-30 DIAGNOSIS — C3402 Malignant neoplasm of left main bronchus: Secondary | ICD-10-CM | POA: Diagnosis not present

## 2016-06-30 DIAGNOSIS — C7801 Secondary malignant neoplasm of right lung: Secondary | ICD-10-CM | POA: Diagnosis not present

## 2016-06-30 DIAGNOSIS — C3492 Malignant neoplasm of unspecified part of left bronchus or lung: Secondary | ICD-10-CM

## 2016-06-30 DIAGNOSIS — Z5111 Encounter for antineoplastic chemotherapy: Secondary | ICD-10-CM | POA: Diagnosis not present

## 2016-06-30 DIAGNOSIS — D72829 Elevated white blood cell count, unspecified: Secondary | ICD-10-CM | POA: Diagnosis not present

## 2016-06-30 DIAGNOSIS — R51 Headache: Secondary | ICD-10-CM | POA: Diagnosis not present

## 2016-06-30 MED ORDER — SODIUM CHLORIDE 0.9 % IJ SOLN
10.0000 mL | Freq: Once | INTRAMUSCULAR | Status: AC
  Administered 2016-06-30: 10 mL via INTRAVENOUS
  Filled 2016-06-30: qty 10

## 2016-06-30 MED ORDER — HEPARIN SOD (PORK) LOCK FLUSH 100 UNIT/ML IV SOLN
INTRAVENOUS | Status: AC
  Filled 2016-06-30: qty 5

## 2016-06-30 MED ORDER — SODIUM CHLORIDE 0.9 % IV SOLN
INTRAVENOUS | Status: DC
  Administered 2016-06-30: 11:00:00 via INTRAVENOUS
  Filled 2016-06-30: qty 1000

## 2016-06-30 MED ORDER — HEPARIN SOD (PORK) LOCK FLUSH 100 UNIT/ML IV SOLN
500.0000 [IU] | Freq: Once | INTRAVENOUS | Status: AC
  Administered 2016-06-30: 500 [IU] via INTRAVENOUS

## 2016-06-30 MED ORDER — SODIUM CHLORIDE 0.9 % IV SOLN
Freq: Once | INTRAVENOUS | Status: AC
  Administered 2016-06-30: 11:00:00 via INTRAVENOUS
  Filled 2016-06-30: qty 250

## 2016-07-03 ENCOUNTER — Other Ambulatory Visit: Payer: Self-pay | Admitting: Family Medicine

## 2016-07-04 NOTE — Telephone Encounter (Signed)
OK to call in. Thanks! 

## 2016-07-04 NOTE — Telephone Encounter (Signed)
Medication called into [facility], and left on the [employer].

## 2016-07-04 NOTE — Telephone Encounter (Signed)
Last OV: 05/31/16 Next OV: none scheduled

## 2016-07-10 ENCOUNTER — Encounter
Admission: RE | Admit: 2016-07-10 | Discharge: 2016-07-10 | Disposition: A | Discharge status: Home / Self Care | Payer: Medicare HMO | Source: Ambulatory Visit | Attending: Orthopedic Surgery | Admitting: Orthopedic Surgery

## 2016-07-10 ENCOUNTER — Ambulatory Visit: Payer: Commercial Managed Care - HMO | Admitting: Family Medicine

## 2016-07-10 ENCOUNTER — Other Ambulatory Visit: Payer: Self-pay | Admitting: Family Medicine

## 2016-07-10 DIAGNOSIS — Z85118 Personal history of other malignant neoplasm of bronchus and lung: Secondary | ICD-10-CM | POA: Diagnosis not present

## 2016-07-10 DIAGNOSIS — Z7902 Long term (current) use of antithrombotics/antiplatelets: Secondary | ICD-10-CM | POA: Diagnosis not present

## 2016-07-10 DIAGNOSIS — M549 Dorsalgia, unspecified: Secondary | ICD-10-CM | POA: Diagnosis not present

## 2016-07-10 DIAGNOSIS — I129 Hypertensive chronic kidney disease with stage 1 through stage 4 chronic kidney disease, or unspecified chronic kidney disease: Secondary | ICD-10-CM | POA: Diagnosis not present

## 2016-07-10 DIAGNOSIS — M810 Age-related osteoporosis without current pathological fracture: Secondary | ICD-10-CM | POA: Diagnosis not present

## 2016-07-10 DIAGNOSIS — S32019A Unspecified fracture of first lumbar vertebra, initial encounter for closed fracture: Secondary | ICD-10-CM

## 2016-07-10 DIAGNOSIS — Z885 Allergy status to narcotic agent status: Secondary | ICD-10-CM | POA: Insufficient documentation

## 2016-07-10 DIAGNOSIS — Z01812 Encounter for preprocedural laboratory examination: Secondary | ICD-10-CM | POA: Insufficient documentation

## 2016-07-10 DIAGNOSIS — Z79899 Other long term (current) drug therapy: Secondary | ICD-10-CM | POA: Diagnosis not present

## 2016-07-10 DIAGNOSIS — Z87891 Personal history of nicotine dependence: Secondary | ICD-10-CM | POA: Diagnosis not present

## 2016-07-10 DIAGNOSIS — J449 Chronic obstructive pulmonary disease, unspecified: Secondary | ICD-10-CM | POA: Diagnosis not present

## 2016-07-10 DIAGNOSIS — E538 Deficiency of other specified B group vitamins: Secondary | ICD-10-CM | POA: Diagnosis not present

## 2016-07-10 DIAGNOSIS — N189 Chronic kidney disease, unspecified: Secondary | ICD-10-CM | POA: Diagnosis not present

## 2016-07-10 DIAGNOSIS — F419 Anxiety disorder, unspecified: Secondary | ICD-10-CM | POA: Diagnosis not present

## 2016-07-10 DIAGNOSIS — S32010A Wedge compression fracture of first lumbar vertebra, initial encounter for closed fracture: Secondary | ICD-10-CM | POA: Diagnosis not present

## 2016-07-10 DIAGNOSIS — Z8673 Personal history of transient ischemic attack (TIA), and cerebral infarction without residual deficits: Secondary | ICD-10-CM | POA: Diagnosis not present

## 2016-07-10 DIAGNOSIS — E785 Hyperlipidemia, unspecified: Secondary | ICD-10-CM | POA: Diagnosis not present

## 2016-07-10 DIAGNOSIS — E039 Hypothyroidism, unspecified: Secondary | ICD-10-CM | POA: Diagnosis not present

## 2016-07-10 DIAGNOSIS — F3289 Other specified depressive episodes: Secondary | ICD-10-CM | POA: Diagnosis not present

## 2016-07-10 DIAGNOSIS — X58XXXA Exposure to other specified factors, initial encounter: Secondary | ICD-10-CM | POA: Insufficient documentation

## 2016-07-10 DIAGNOSIS — M8088XA Other osteoporosis with current pathological fracture, vertebra(e), initial encounter for fracture: Secondary | ICD-10-CM | POA: Diagnosis not present

## 2016-07-10 HISTORY — DX: Anxiety disorder, unspecified: F41.9

## 2016-07-10 HISTORY — DX: Hypothyroidism, unspecified: E03.9

## 2016-07-10 LAB — BASIC METABOLIC PANEL
ANION GAP: 6 (ref 5–15)
BUN: 22 mg/dL — ABNORMAL HIGH (ref 6–20)
CO2: 25 mmol/L (ref 22–32)
Calcium: 9.1 mg/dL (ref 8.9–10.3)
Chloride: 107 mmol/L (ref 101–111)
Creatinine, Ser: 1.02 mg/dL — ABNORMAL HIGH (ref 0.44–1.00)
GFR calc Af Amer: >60 mL/min (ref >60)
GFR calc non Af Amer: 52 mL/min — ABNORMAL LOW (ref >60)
GLUCOSE: 113 mg/dL — AB (ref 65–99)
POTASSIUM: 4.4 mmol/L (ref 3.5–5.1)
Sodium: 138 mmol/L (ref 135–145)

## 2016-07-10 LAB — CBC
HCT: 35 % (ref 35.0–47.0)
HEMOGLOBIN: 11.3 g/dL — AB (ref 12.0–16.0)
MCH: 32.8 pg (ref 26.0–34.0)
MCHC: 32.4 g/dL (ref 32.0–36.0)
MCV: 101.5 fL — ABNORMAL HIGH (ref 80.0–100.0)
Platelets: 240 10*3/uL (ref 150–440)
RBC: 3.45 MIL/uL — ABNORMAL LOW (ref 3.80–5.20)
RDW: 18 % — AB (ref 11.5–14.5)
WBC: 13.6 10*3/uL — ABNORMAL HIGH (ref 3.6–11.0)

## 2016-07-10 NOTE — Patient Instructions (Signed)
  Your procedure is scheduled on: Thursday Jan. 11, 2018. Report to Same Day Surgery. To find out your arrival time please call [phone] between 1PM - 3PM on Wed. Jan.10, 2018.  Remember: Instructions that are not followed completely may result in serious medical risk, up to and including death, or upon the discretion of your surgeon and anesthesiologist your surgery may need to be rescheduled.    _x___ 1. Do not eat food or drink liquids after midnight. No gum chewing or hard candies.     ____ 2. No Alcohol for 24 hours before or after surgery.   ____ 3. Bring all medications with you on the day of surgery if instructed.    __x__ 4. Notify your doctor if there is any change in your medical condition     (cold, fever, infections).    _____ 5. No smoking 24 hours prior to surgery.     Do not wear jewelry, make-up, hairpins, clips or nail polish.  Do not wear lotions, powders, or perfumes.   Do not shave 48 hours prior to surgery. Men may shave face and neck.  Do not bring valuables to the hospital.    [facility] is not responsible for any belongings or valuables.               Contacts, dentures or bridgework may not be worn into surgery.  Leave your suitcase in the car. After surgery it may be brought to your room.  For patients admitted to the hospital, discharge time is determined by your treatment team.   Patients discharged the day of surgery will not be allowed to drive home.    Please read over the following fact sheets that you were given:   [facility] Preparing for Surgery  _x___ Take these medicines the morning of surgery with A SIP OF WATER:    1. levothyroxine (SYNTHROID, LEVOTHROID)   2. omeprazole (PRILOSEC)    ____ Fleet Enema (as directed)   _x___ Use CHG Soap as directed on instruction sheet  _x___ Use inhalers on the day of surgery and bring to hospital day of surgery  ____ Stop metformin 2 days prior to surgery    ____ Take 1/2 of usual insulin  dose the night before surgery and none on the morning of surgery.   __x__ Stop Plavix as directed by Dr. Rudene Christians.  __x__ Stop Anti-inflammatories such as Advil, Aleve, Ibuprofen, Motrin, Naproxen, Naprosyn, Goodies powders or aspirin products. OK to take Tylenol.   ____ Stop supplements until after surgery.    ____ Bring C-Pap to the hospital.

## 2016-07-12 MED ORDER — CEFAZOLIN IN D5W 1 GM/50ML IV SOLN
1.0000 g | Freq: Once | INTRAVENOUS | Status: AC
  Administered 2016-07-13: 1 g via INTRAVENOUS

## 2016-07-13 ENCOUNTER — Ambulatory Visit: Payer: Medicare HMO | Admitting: Anesthesiology

## 2016-07-13 ENCOUNTER — Ambulatory Visit: Payer: Medicare HMO

## 2016-07-13 ENCOUNTER — Encounter
Admission: RE | Disposition: A | Discharge status: Home / Self Care | Payer: Self-pay | Source: Ambulatory Visit | Attending: Orthopedic Surgery

## 2016-07-13 ENCOUNTER — Ambulatory Visit
Admission: RE | Admit: 2016-07-13 | Discharge: 2016-07-13 | Disposition: A | Discharge status: Home / Self Care | Payer: Medicare HMO | Source: Ambulatory Visit | Attending: Orthopedic Surgery | Admitting: Orthopedic Surgery

## 2016-07-13 DIAGNOSIS — F419 Anxiety disorder, unspecified: Secondary | ICD-10-CM | POA: Diagnosis not present

## 2016-07-13 DIAGNOSIS — N189 Chronic kidney disease, unspecified: Secondary | ICD-10-CM | POA: Diagnosis not present

## 2016-07-13 DIAGNOSIS — M8088XA Other osteoporosis with current pathological fracture, vertebra(e), initial encounter for fracture: Secondary | ICD-10-CM | POA: Diagnosis not present

## 2016-07-13 DIAGNOSIS — J449 Chronic obstructive pulmonary disease, unspecified: Secondary | ICD-10-CM | POA: Insufficient documentation

## 2016-07-13 DIAGNOSIS — F418 Other specified anxiety disorders: Secondary | ICD-10-CM | POA: Diagnosis not present

## 2016-07-13 DIAGNOSIS — M4856XA Collapsed vertebra, not elsewhere classified, lumbar region, initial encounter for fracture: Secondary | ICD-10-CM | POA: Diagnosis not present

## 2016-07-13 DIAGNOSIS — F3289 Other specified depressive episodes: Secondary | ICD-10-CM | POA: Diagnosis not present

## 2016-07-13 DIAGNOSIS — M810 Age-related osteoporosis without current pathological fracture: Secondary | ICD-10-CM | POA: Insufficient documentation

## 2016-07-13 DIAGNOSIS — E785 Hyperlipidemia, unspecified: Secondary | ICD-10-CM | POA: Diagnosis not present

## 2016-07-13 DIAGNOSIS — Z462 Encounter for fitting and adjustment of other devices related to nervous system and special senses: Secondary | ICD-10-CM | POA: Diagnosis not present

## 2016-07-13 DIAGNOSIS — E538 Deficiency of other specified B group vitamins: Secondary | ICD-10-CM | POA: Insufficient documentation

## 2016-07-13 DIAGNOSIS — Z87891 Personal history of nicotine dependence: Secondary | ICD-10-CM | POA: Insufficient documentation

## 2016-07-13 DIAGNOSIS — Z79899 Other long term (current) drug therapy: Secondary | ICD-10-CM | POA: Insufficient documentation

## 2016-07-13 DIAGNOSIS — Z85118 Personal history of other malignant neoplasm of bronchus and lung: Secondary | ICD-10-CM | POA: Insufficient documentation

## 2016-07-13 DIAGNOSIS — E039 Hypothyroidism, unspecified: Secondary | ICD-10-CM | POA: Diagnosis not present

## 2016-07-13 DIAGNOSIS — I129 Hypertensive chronic kidney disease with stage 1 through stage 4 chronic kidney disease, or unspecified chronic kidney disease: Secondary | ICD-10-CM | POA: Insufficient documentation

## 2016-07-13 DIAGNOSIS — Z8673 Personal history of transient ischemic attack (TIA), and cerebral infarction without residual deficits: Secondary | ICD-10-CM | POA: Insufficient documentation

## 2016-07-13 DIAGNOSIS — Z7902 Long term (current) use of antithrombotics/antiplatelets: Secondary | ICD-10-CM | POA: Insufficient documentation

## 2016-07-13 DIAGNOSIS — S32010A Wedge compression fracture of first lumbar vertebra, initial encounter for closed fracture: Secondary | ICD-10-CM | POA: Diagnosis not present

## 2016-07-13 DIAGNOSIS — M40209 Unspecified kyphosis, site unspecified: Secondary | ICD-10-CM

## 2016-07-13 HISTORY — PX: KYPHOPLASTY: SHX5884

## 2016-07-13 SURGERY — KYPHOPLASTY
Anesthesia: General | Wound class: Clean

## 2016-07-13 MED ORDER — PHENYLEPHRINE 40 MCG/ML (10ML) SYRINGE FOR IV PUSH (FOR BLOOD PRESSURE SUPPORT)
PREFILLED_SYRINGE | INTRAVENOUS | Status: AC
  Filled 2016-07-13: qty 10

## 2016-07-13 MED ORDER — CEFAZOLIN IN D5W 1 GM/50ML IV SOLN
INTRAVENOUS | Status: AC
  Filled 2016-07-13: qty 50

## 2016-07-13 MED ORDER — OXYCODONE-ACETAMINOPHEN 5-325 MG PO TABS: 1.0000 | ORAL_TABLET | ORAL | 0 refills | Status: DC | PRN

## 2016-07-13 MED ORDER — PROPOFOL 10 MG/ML IV BOLUS
INTRAVENOUS | Status: DC | PRN
  Administered 2016-07-13: 20 mg via INTRAVENOUS

## 2016-07-13 MED ORDER — EPINEPHRINE PF 1 MG/ML IJ SOLN
INTRAMUSCULAR | Status: AC
  Filled 2016-07-13: qty 1

## 2016-07-13 MED ORDER — PROPOFOL 10 MG/ML IV BOLUS
INTRAVENOUS | Status: AC
  Filled 2016-07-13: qty 20

## 2016-07-13 MED ORDER — PHENYLEPHRINE HCL 10 MG/ML IJ SOLN
INTRAMUSCULAR | Status: DC | PRN
  Administered 2016-07-13: 40 ug via INTRAVENOUS
  Administered 2016-07-13: 80 ug via INTRAVENOUS

## 2016-07-13 MED ORDER — LACTATED RINGERS IV SOLN
INTRAVENOUS | Status: DC
  Administered 2016-07-13: 07:00:00 via INTRAVENOUS

## 2016-07-13 MED ORDER — KETAMINE HCL 10 MG/ML IJ SOLN
INTRAMUSCULAR | Status: AC
  Filled 2016-07-13: qty 1

## 2016-07-13 MED ORDER — BUPIVACAINE-EPINEPHRINE (PF) 0.5% -1:200000 IJ SOLN
INTRAMUSCULAR | Status: DC | PRN
  Administered 2016-07-13: 10 mL via PERINEURAL

## 2016-07-13 MED ORDER — LIDOCAINE HCL (CARDIAC) 20 MG/ML IV SOLN
INTRAVENOUS | Status: DC | PRN
  Administered 2016-07-13: 70 mg via INTRAVENOUS

## 2016-07-13 MED ORDER — KETAMINE HCL 50 MG/ML IJ SOLN
INTRAMUSCULAR | Status: DC | PRN
  Administered 2016-07-13: 10 mg via INTRAMUSCULAR
  Administered 2016-07-13: 20 mg via INTRAMUSCULAR

## 2016-07-13 MED ORDER — FENTANYL CITRATE (PF) 100 MCG/2ML IJ SOLN: 25.0000 ug | INTRAMUSCULAR | Status: DC | PRN

## 2016-07-13 MED ORDER — IOPAMIDOL (ISOVUE-M 200) INJECTION 41%
INTRAMUSCULAR | Status: AC
  Filled 2016-07-13: qty 20

## 2016-07-13 MED ORDER — EPINEPHRINE PF 1 MG/10ML IJ SOSY
PREFILLED_SYRINGE | INTRAMUSCULAR | Status: DC | PRN
  Administered 2016-07-13: 10 ug via INTRAVENOUS

## 2016-07-13 MED ORDER — BUPIVACAINE HCL (PF) 0.5 % IJ SOLN
INTRAMUSCULAR | Status: AC
  Filled 2016-07-13: qty 30

## 2016-07-13 MED ORDER — LIDOCAINE HCL (PF) 1 % IJ SOLN
INTRAMUSCULAR | Status: AC
  Filled 2016-07-13: qty 60

## 2016-07-13 MED ORDER — LIDOCAINE 2% (20 MG/ML) 5 ML SYRINGE
INTRAMUSCULAR | Status: AC
  Filled 2016-07-13: qty 5

## 2016-07-13 MED ORDER — PROPOFOL 500 MG/50ML IV EMUL
INTRAVENOUS | Status: DC | PRN
  Administered 2016-07-13: 70 ug/kg/min via INTRAVENOUS

## 2016-07-13 MED ORDER — LIDOCAINE HCL 1 % IJ SOLN
INTRAMUSCULAR | Status: DC | PRN
  Administered 2016-07-13 (×2): 10 mL

## 2016-07-13 MED ORDER — LABETALOL HCL 5 MG/ML IV SOLN
INTRAVENOUS | Status: DC | PRN
  Administered 2016-07-13: 10 mg via INTRAVENOUS

## 2016-07-13 MED ORDER — PROMETHAZINE HCL 25 MG/ML IJ SOLN: 6.2500 mg | INTRAMUSCULAR | Status: DC | PRN

## 2016-07-13 SURGICAL SUPPLY — 16 items
BNDG ADH 2 X3.75 FABRIC TAN LF (GAUZE/BANDAGES/DRESSINGS) ×2 IMPLANT
CEMENT KYPHON CX01A KIT/MIXER (Cement) ×2 IMPLANT
DEVICE BIOPSY BONE KYPHX (INSTRUMENTS) IMPLANT
DRAPE C-ARM XRAY 36X54 (DRAPES) ×2 IMPLANT
DURAPREP 26ML APPLICATOR (WOUND CARE) ×2 IMPLANT
GLOVE SURG SYN 9.0  PF PI (GLOVE) ×1
GLOVE SURG SYN 9.0 PF PI (GLOVE) ×1 IMPLANT
GOWN SRG 2XL LVL 4 RGLN SLV (GOWNS) ×1 IMPLANT
GOWN STRL NON-REIN 2XL LVL4 (GOWNS) ×1
GOWN STRL REUS W/ TWL LRG LVL3 (GOWN DISPOSABLE) ×1 IMPLANT
GOWN STRL REUS W/TWL LRG LVL3 (GOWN DISPOSABLE) ×1
LIQUID BAND (GAUZE/BANDAGES/DRESSINGS) ×2 IMPLANT
PACK KYPHOPLASTY (MISCELLANEOUS) ×2 IMPLANT
STRAP SAFETY BODY (MISCELLANEOUS) ×2 IMPLANT
TRAY KYPHOPAK 15/3 EXPRESS 1ST (MISCELLANEOUS) ×2 IMPLANT
TRAY KYPHOPAK 20/3 EXPRESS 1ST (MISCELLANEOUS) ×2 IMPLANT

## 2016-07-13 NOTE — Addendum Note (Signed)
Addendum  created 07/13/16 1019 by Bernardo Heater, CRNA   Anesthesia Intra Meds edited

## 2016-07-13 NOTE — Anesthesia Preprocedure Evaluation (Signed)
Anesthesia Evaluation  Patient identified by MRN, date of birth, ID band Patient awake    Reviewed: Allergy & Precautions, NPO status , Patient's Chart, lab work & pertinent test results  History of Anesthesia Complications Negative for: history of anesthetic complications  Airway Mallampati: II  TM Distance: >3 FB Neck ROM: Full    Dental  (+) Upper Dentures, Partial Lower   Pulmonary neg sleep apnea, COPD,  COPD inhaler, former smoker,    breath sounds clear to auscultation- rhonchi (-) wheezing      Cardiovascular hypertension, Pt. on medications (-) CAD and (-) Past MI  Rhythm:Regular Rate:Normal - Systolic murmurs and - Diastolic murmurs    Neuro/Psych Anxiety Depression    GI/Hepatic negative GI ROS, Neg liver ROS,   Endo/Other  neg diabetesHypothyroidism   Renal/GU Renal InsufficiencyRenal disease     Musculoskeletal  (+) Arthritis ,   Abdominal (+) - obese,   Peds  Hematology negative hematology ROS (+)   Anesthesia Other Findings Past Medical History: No date: Abdominal aneurysm ([facility]) No date: Anxiety 03/10/2016: Cancer ([place])     Comment: lung No date: Chronic kidney disease No date: Collagenous colitis No date: COPD (chronic obstructive pulmonary disease) (* No date: DDD (degenerative disc disease), cervical No date: Depression No date: Hernia of abdominal cavity No date: Hyperlipidemia No date: Hypothyroidism No date: Osteoporosis No date: Pneumonia No date: Stroke ([place]) No date: Tobacco abuse No date: Urine incontinence No date: Vitamin B 12 deficiency   Reproductive/Obstetrics                             Anesthesia Physical Anesthesia Plan  ASA: III  Anesthesia Plan: General   Post-op Pain Management:    Induction: Intravenous  Airway Management Planned: Natural Airway  Additional Equipment:   Intra-op Plan:   Post-operative Plan:   Informed  Consent: I have reviewed the patients History and Physical, chart, labs and discussed the procedure including the risks, benefits and alternatives for the proposed anesthesia with the patient or authorized representative who has indicated his/her understanding and acceptance.   Dental advisory given  Plan Discussed with: CRNA and Anesthesiologist  Anesthesia Plan Comments:         Anesthesia Quick Evaluation

## 2016-07-13 NOTE — Transfer of Care (Signed)
Immediate Anesthesia Transfer of Care Note  Patient: Melody Green  Procedure(s) Performed: Procedure(s): KYPHOPLASTY (N/A)  Patient Location: PACU  Anesthesia Type:General  Level of Consciousness: sedated  Airway & Oxygen Therapy: Patient Spontanous Breathing and Patient connected to nasal cannula oxygen  Post-op Assessment: Report given to RN and Post -op Vital signs reviewed and stable  Post vital signs: Reviewed and stable  Last Vitals:  Vitals:   07/13/16 0613  BP: 126/86  Pulse: (!) 115  Resp: 16  Temp: (!) 35.9 C    Last Pain:  Vitals:   07/13/16 0613  TempSrc: Tympanic  PainSc: 9          Complications: No apparent anesthesia complications

## 2016-07-13 NOTE — H&P (Signed)
Reviewed paper H+P, will be scanned into chart. No changes noted.  

## 2016-07-13 NOTE — Discharge Instructions (Addendum)
Activities as tolerated starting today. Remove Band-Aid on Saturday and okay to shower after that.  AMBULATORY SURGERY  DISCHARGE INSTRUCTIONS   1) The drugs that you were given will stay in your system until tomorrow so for the next 24 hours you should not:  A) Drive an automobile B) Make any legal decisions C) Drink any alcoholic beverage   2) You may resume regular meals tomorrow.  Today it is better to start with liquids and gradually work up to solid foods.  You may eat anything you prefer, but it is better to start with liquids, then soup and crackers, and gradually work up to solid foods.   3) Please notify your doctor immediately if you have any unusual bleeding, trouble breathing, redness and pain at the surgery site, drainage, fever, or pain not relieved by medication.    4) Additional Instructions:        Please contact your physician with any problems or Same Day Surgery at [phone], Monday through Friday 6 am to 4 pm, or [place] at [facility] number at [phone].

## 2016-07-13 NOTE — Op Note (Signed)
07/13/2016  8:14 AM  PATIENT:  Melody Green  76 y.o. female  PRE-OPERATIVE DIAGNOSIS:  COMPRESSION FRACTURE- L1  POST-OPERATIVE DIAGNOSIS:  COMPRESSION FRACTURE- L1  PROCEDURE:  Procedure(s): KYPHOPLASTY (N/A)  SURGEON: Laurene Footman, MD  ASSISTANTS: none  ANESTHESIA:   local and MAC  EBL:  Total I/O In: 500 [I.V.:500] Out: 0   BLOOD ADMINISTERED:none  DRAINS: none   LOCAL MEDICATIONS USED:  MARCAINE    and XYLOCAINE   SPECIMEN:  No Specimen  DISPOSITION OF SPECIMEN:  N/A  COUNTS:  YES  TOURNIQUET:  * No tourniquets in log *  IMPLANTS: Bone cement  DICTATION: .Dragon Dictation patient was brought the operating room and after adequate sedation was given patient was placed prone C-arm was brought in and good visualization of L1 was obtained. With bolsters under the pelvis and chest there was partial correction of her deformity with a postural reduction of the L1 compression fracture. Timeout procedures carried out and local anesthetic was infiltrated on either side of L1. The back was then prepped and draped in usual sterile fashion repeat timeout procedure carried out. Spinal needle was used to get down on the right side with a pedicle is better visualized and Sensorcaine with appendectomy and Xylocaine was infiltrated and a tract. After allowing this to set a small incision was made and a trocar advanced and extrapedicular fashion. Drilling was carried out across the cleft and and then balloon and inflated to 4 cc with very good correction of her deformity. The cement was mixed and when it was the appropriate consistency proximal before have cc infiltrated with very good fill of the vertebral body without extravasation. When the cemented set the trochars removed and permanent C-arm views obtained in both AP and lateral projections. The wound was closed with Dermabond followed by a Band-Aid  PLAN OF CARE: Discharge to home after PACU  PATIENT DISPOSITION:  PACU -  hemodynamically stable.

## 2016-07-13 NOTE — Anesthesia Postprocedure Evaluation (Signed)
Anesthesia Post Note  Patient: Melody Green  Procedure(s) Performed: Procedure(s) (LRB): KYPHOPLASTY (N/A)  Patient location during evaluation: PACU Anesthesia Type: General Level of consciousness: awake and alert Pain management: pain level controlled Vital Signs Assessment: post-procedure vital signs reviewed and stable Respiratory status: spontaneous breathing, nonlabored ventilation and respiratory function stable Cardiovascular status: blood pressure returned to baseline and stable Postop Assessment: no signs of nausea or vomiting Anesthetic complications: no     Last Vitals:  Vitals:   07/13/16 0845 07/13/16 0900  BP: (!) 161/97 (!) 164/100  Pulse: 76 74  Resp: 13 13  Temp:      Last Pain:  Vitals:   07/13/16 0915  TempSrc:   PainSc: 0-No pain                 Samai Corea

## 2016-07-14 ENCOUNTER — Encounter: Payer: Self-pay | Admitting: Orthopedic Surgery

## 2016-07-17 ENCOUNTER — Ambulatory Visit
Admission: RE | Admit: 2016-07-17 | Discharge: 2016-07-17 | Disposition: A | Discharge status: Home / Self Care | Payer: Medicare HMO | Source: Ambulatory Visit | Attending: Oncology | Admitting: Oncology

## 2016-07-17 DIAGNOSIS — C3492 Malignant neoplasm of unspecified part of left bronchus or lung: Secondary | ICD-10-CM | POA: Diagnosis not present

## 2016-07-17 DIAGNOSIS — M799 Soft tissue disorder, unspecified: Secondary | ICD-10-CM | POA: Insufficient documentation

## 2016-07-17 DIAGNOSIS — C7802 Secondary malignant neoplasm of left lung: Secondary | ICD-10-CM | POA: Diagnosis not present

## 2016-07-17 LAB — GLUCOSE, CAPILLARY: Glucose-Capillary: 82 mg/dL (ref 65–99)

## 2016-07-17 MED ORDER — FLUDEOXYGLUCOSE F - 18 (FDG) INJECTION
11.9250 | Freq: Once | INTRAVENOUS | Status: AC | PRN
  Administered 2016-07-17: 11.925 via INTRAVENOUS

## 2016-07-17 NOTE — Progress Notes (Deleted)
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  INTERVAL HISTORY: Patient returns to clinic today for further evaluation and consideration of cycle 4 of carboplatinum and etoposide. She continues to have chronic shortness of breath. She has had noticed increased abdominal swelling as well as bilateral peripheral edema. She continues to have weakness and fatigue, but this is relatively unchanged. She denies any fevers or illnesses. She denies any chest pain, cough, or hemoptysis. She has a good appetite. She has no nausea, vomiting, constipation, or diarrhea. She has no urinary complaints. Patient offers no further specific complaints.  REVIEW OF SYSTEMS:   Review of Systems  Constitutional: Positive for malaise/fatigue. Negative for fever and weight loss.  Eyes: Negative.  Negative for blurred vision and double vision.  Respiratory: Positive for shortness of breath. Negative for cough and hemoptysis.   Cardiovascular: Positive for leg swelling. Negative for chest pain.  Gastrointestinal: Negative.  Negative for abdominal pain.  Genitourinary: Negative.   Musculoskeletal: Negative.   Neurological: Positive for weakness. Negative for focal weakness and headaches.  Psychiatric/Behavioral: Negative.  The patient is not nervous/anxious.     As per HPI. Otherwise, a complete review of systems is negative.  PAST MEDICAL HISTORY: Past Medical History:  Diagnosis Date  . 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 Anxiety   . Cancer ([place]) 03/10/2016   lung  . Chronic kidney disease   . Collagenous  colitis   . COPD (chronic obstructive pulmonary disease) ([place])   . DDD (degenerative disc disease), cervical   . Depression   . Hernia of abdominal cavity   . Hyperlipidemia   . Hypothyroidism   . Osteoporosis   . Pneumonia   . Stroke ([place])   . Tobacco abuse   . Urine incontinence   . Vitamin B 12 deficiency     PAST SURGICAL HISTORY: Past Surgical History:  Procedure Laterality Date  . ABDOMINAL AORTIC ANEURYSM REPAIR    . ABDOMINAL HYSTERECTOMY    . APPENDECTOMY    . BACK SURGERY  [phone]  . CHOLECYSTECTOMY    . FLEXIBLE BRONCHOSCOPY N/A 03/17/2016   Procedure: FLEXIBLE BRONCHOSCOPY;  Surgeon: Wilhelmina Mcardle, MD;  Location: [facility] ORS;  Service: Pulmonary;  Laterality: N/A;  . KYPHOPLASTY N/A 07/13/2016   Procedure: KYPHOPLASTY;  Surgeon: Hessie Knows, MD;  Location: [facility] ORS;  Service: Orthopedics;  Laterality: N/A;  . PERIPHERAL VASCULAR CATHETERIZATION N/A 04/03/2016   Procedure: Glori Luis Cath Insertion;  Surgeon: Algernon Huxley, MD;  Location: [facility];  Service: Cardiovascular;  Laterality: N/A;    FAMILY HISTORY: Family History  Problem Relation Age of Onset  . Diabetes Mother   . Diabetes Father     ADVANCED DIRECTIVES (Y/N):  N  HEALTH MAINTENANCE: Social History  Substance Use Topics  . Smoking status: Former Smoker    Packs/day: 0.25    Types: Cigarettes  . Smokeless tobacco: Never Used     Comment: quit smoking 03/04/16  . Alcohol use No     Colonoscopy:  PAP:  Bone density:  Lipid panel:  Allergies  Allergen Reactions  . Hydrocodone Itching and Rash  .  Codeine Nausea Only  . Ultram [Tramadol] Itching    Current Outpatient Prescriptions  Medication Sig Dispense Refill  . albuterol (PROVENTIL HFA) 108 (90 Base) MCG/ACT inhaler Inhale 2 puffs into the lungs every 4 (four) hours as needed for wheezing or shortness of breath. 1 Inhaler 0  . amLODipine (NORVASC) 2.5 MG tablet Take 2.5 mg by mouth daily.    Marland Kitchen atorvastatin (LIPITOR) 20 MG  tablet Take 20 mg by mouth at bedtime.    . bisacodyl (DULCOLAX) 5 MG EC tablet Take 5 mg by mouth daily as needed for moderate constipation.    . calcium carbonate (OS-CAL) 600 MG TABS tablet Take 600 mg by mouth at bedtime.     . clopidogrel (PLAVIX) 75 MG tablet Take 75 mg by mouth at bedtime.    . cyanocobalamin (,VITAMIN B-12,) 1000 MCG/ML injection Inject 1,000 mcg into the muscle every 30 (thirty) days.    . cyclobenzaprine (FLEXERIL) 10 MG tablet Take 1 tablet (10 mg total) by mouth 2 (two) times daily with a meal. (Patient taking differently: Take 10 mg by mouth 2 (two) times daily as needed for muscle spasms. ) 30 tablet 0  . furosemide (LASIX) 20 MG tablet Take 1 tablet (20 mg total) by mouth daily as needed. (Patient not taking: Reported on 07/13/2016) 30 tablet 1  . levothyroxine (SYNTHROID, LEVOTHROID) 75 MCG tablet TAKE 1 TABLET EVERY DAY BEFORE BREAKFAST 90 tablet 1  . lidocaine (LIDODERM) 5 % Place 1 patch onto the skin daily. Remove & Discard patch within 12 hours or as directed by MD (Patient not taking: Reported on 07/11/2016) 30 patch 0  . lidocaine-prilocaine (EMLA) cream Apply to port site 1 hour before port is accessed each time (Patient taking differently: Apply 1 application topically daily as needed (applied to port 1 hours before accessed). Apply to port site 1 hour before port is accessed each time) 30 g 3  . LORazepam (ATIVAN) 1 MG tablet TAKE 1/2 TABLET EVERY DAY AS NEEDED FOR ANXIETY (Patient not taking: Reported on 07/13/2016) 30 tablet 1  . mometasone (NASONEX) 50 MCG/ACT nasal spray Place 2 sprays into the nose daily as needed (for rhinitis).     . Morphine Sulfate (MORPHINE CONCENTRATE) 10 mg / 0.5 ml concentrated solution Take 1 mL (20 mg total) by mouth every 6 (six) hours as needed for severe pain. (Patient not taking: Reported on 07/11/2016) 30 mL 0  . omeprazole (PRILOSEC) 20 MG capsule TAKE 1 CAPSULE (20 MG TOTAL) BY MOUTH DAILY. (Patient taking differently: TAKE 1  CAPSULE (20 MG TOTAL) BY MOUTH DAILY AS NEEDED FOR ACID REFLUX) 30 capsule 3  . ondansetron (ZOFRAN) 8 MG tablet Take 1 tablet (8 mg total) by mouth 2 (two) times daily as needed for refractory nausea / vomiting. (Patient not taking: Reported on 07/13/2016) 30 tablet 2  . oxyCODONE-acetaminophen (ROXICET) 5-325 MG tablet Take 1 tablet by mouth every 4 (four) hours as needed for severe pain. 10 tablet 0  . potassium chloride SA (K-DUR,KLOR-CON) 20 MEQ tablet Take 1 tablet (20 mEq total) by mouth 2 (two) times daily. 60 tablet 1  . prochlorperazine (COMPAZINE) 10 MG tablet Take 1 tablet (10 mg total) by mouth every 6 (six) hours as needed (Nausea or vomiting). (Patient not taking: Reported on 07/13/2016) 30 tablet 1   No current facility-administered medications for this visit.     OBJECTIVE: There were no vitals filed for this visit.   There is no height or weight on file  to calculate BMI.    ECOG FS:1 - Symptomatic but completely ambulatory  General: Thin, no acute distress. Eyes: Pink conjunctiva, anicteric sclera. Lungs: Clear to auscultation bilaterally. Heart: Regular rate and rhythm. No rubs, murmurs, or gallops. Abdomen: Mildly distended, nontender.. Musculoskeletal: No edema, cyanosis, or clubbing. Neuro: Alert, answering all questions appropriately. Cranial nerves grossly intact. Skin: No rashes or petechiae noted. Psych: Normal affect.   LAB RESULTS:  Lab Results  Component Value Date   NA 138 07/10/2016   K 4.4 07/10/2016   CL 107 07/10/2016   CO2 25 07/10/2016   GLUCOSE 113 (H) 07/10/2016   BUN 22 (H) 07/10/2016   CREATININE 1.02 (H) 07/10/2016   CALCIUM 9.1 07/10/2016   PROT 5.6 (L) 06/07/2016   ALBUMIN 3.0 (L) 06/07/2016   AST 21 06/07/2016   ALT 9 (L) 06/07/2016   ALKPHOS 125 06/07/2016   BILITOT 0.3 06/07/2016   GFRNONAA 52 (L) 07/10/2016   GFRAA >60 07/10/2016    Lab Results  Component Value Date   WBC 13.6 (H) 07/10/2016   NEUTROABS 23.2 (H) 06/07/2016     HGB 11.3 (L) 07/10/2016   HCT 35.0 07/10/2016   MCV 101.5 (H) 07/10/2016   PLT 240 07/10/2016     STUDIES: Dg Lumbar Spine 2-3 Views  Result Date: 07/13/2016 CLINICAL DATA:  Kyphoplasty EXAM: LUMBAR SPINE - 2-3 VIEW; DG C-ARM 61-120 MIN COMPARISON:  09/11/2013 FLUOROSCOPY TIME:  1 minutes 9 seconds Dose:  1.4 mGy Images obtained:  2 FINDINGS: Marked osseous demineralization. AP and lateral views of a section of the thoracolumbar spine are submitted. Unable to establish accurate spinal segment numbering due to lack of landmarks. Spinal augmentation procedures have been performed at 2 levels of the spine with a single intervening vertebral body of normal height. The more superior of the 2 levels was performed previously, with the more caudal level new. Both vertebra demonstrate mild anterior height losses. IMPRESSION: Prior kyphoplasties at 2 levels of the thoracolumbar spine as above. Electronically Signed   By: Lavonia Dana M.D.   On: 07/13/2016 08:33   Dg C-arm 1-60 Min  Result Date: 07/13/2016 CLINICAL DATA:  Kyphoplasty EXAM: LUMBAR SPINE - 2-3 VIEW; DG C-ARM 61-120 MIN COMPARISON:  09/11/2013 FLUOROSCOPY TIME:  1 minutes 9 seconds Dose:  1.4 mGy Images obtained:  2 FINDINGS: Marked osseous demineralization. AP and lateral views of a section of the thoracolumbar spine are submitted. Unable to establish accurate spinal segment numbering due to lack of landmarks. Spinal augmentation procedures have been performed at 2 levels of the spine with a single intervening vertebral body of normal height. The more superior of the 2 levels was performed previously, with the more caudal level new. Both vertebra demonstrate mild anterior height losses. IMPRESSION: Prior kyphoplasties at 2 levels of the thoracolumbar spine as above. Electronically Signed   By: Lavonia Dana M.D.   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  Stage IV  left hilar small cell lung cancer with right lung metastasis: Case discussed with pulmonary. Patient noted to have bilateral disease on bronchoscopy. PET scan results reviewed independently with bilateral lung metastasis as well as a hypermetabolic right sacral bone metastasis. MRI the brain is negative for intracranial metastasis. Proceed with cycle43, day 1 of carboplatinum and etoposide today. Return to clinic in 1 and 2 days for etoposide only and then in 6 weeks for repeat  imaging and consideration of cycle 5 if necessary.  2. Headaches: MRI negative for metastasis, monitor. 3. Pain: Patient does not complain of pain today. Monitor. 4. Weight loss: Likely multifactorial including possible underlying malignancy. Consider Megace in the future. 5. Thrombocytopenia: Resolved. Secondary chemotherapy, monitor.  6. Leukocytosis: Likely secondary to Neulasta, monitor. 7. Abdominal swelling: Ultrasound negative for ascites. Monitor.   Patient expressed understanding and was in agreement with this plan. She also understands that She can call clinic at any time with any questions, concerns, or complaints.   Primary cancer of left lung metastatic to other site [facility])   Staging form: Lung, AJCC 7th Edition   - Clinical stage from 03/12/2016: Stage IV (T3, N3, M1a) - Signed by Lloyd Huger, MD on 03/26/2016  Lloyd Huger, MD   07/17/2016 9:39 PM

## 2016-07-18 ENCOUNTER — Other Ambulatory Visit: Payer: Self-pay | Admitting: Oncology

## 2016-07-19 ENCOUNTER — Inpatient Hospital Stay: Payer: Medicare HMO | Admitting: Oncology

## 2016-07-19 ENCOUNTER — Inpatient Hospital Stay: Payer: Medicare HMO

## 2016-07-20 ENCOUNTER — Ambulatory Visit: Payer: Commercial Managed Care - HMO

## 2016-07-21 ENCOUNTER — Ambulatory Visit: Payer: Commercial Managed Care - HMO

## 2016-07-25 NOTE — Progress Notes (Signed)
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  INTERVAL HISTORY: Patient returns to clinic today for further evaluation and in discussion of her imaging results she complains of worsening pain, but her son states she refuses to take her prescribed narcotics as recommended. She continues to have chronic shortness of breath. She continues to have weakness and fatigue, but this is relatively unchanged. She denies any fevers or illnesses. She denies any chest pain, cough, or hemoptysis. She has a fair appetite. She has no nausea, vomiting, constipation, or diarrhea. She has no urinary complaints. Patient offers no further specific complaints.  REVIEW OF SYSTEMS:   Review of Systems  Constitutional: Positive for malaise/fatigue. Negative for fever and weight loss.  Eyes: Negative.  Negative for blurred vision and double vision.  Respiratory: Positive for shortness of breath. Negative for cough and hemoptysis.   Cardiovascular: Positive for leg swelling. Negative for chest pain.  Gastrointestinal: Negative.  Negative for abdominal pain.  Genitourinary: Negative.   Musculoskeletal: Negative.   Neurological: Positive for weakness. Negative for focal weakness and headaches.  Psychiatric/Behavioral: Negative.  The patient is not nervous/anxious.     As per HPI. Otherwise, a complete review of systems is negative.  PAST MEDICAL HISTORY: Past Medical History:  Diagnosis Date  . Abdominal aneurysm ([place])   . Anxiety   . Cancer ([place]) 03/10/2016   lung  . Chronic kidney disease   .  Collagenous colitis   . COPD (chronic obstructive pulmonary disease) ([place])   . DDD (degenerative disc disease), cervical   . Depression   . Hernia of abdominal cavity   . Hyperlipidemia   . Hypothyroidism   . Osteoporosis   . Pneumonia   . Stroke ([place])   . Tobacco abuse   . Urine incontinence   . Vitamin B 12 deficiency     PAST SURGICAL HISTORY: Past Surgical History:  Procedure Laterality Date  . ABDOMINAL AORTIC ANEURYSM REPAIR    . ABDOMINAL HYSTERECTOMY    . APPENDECTOMY    . BACK SURGERY  [phone]  . CHOLECYSTECTOMY    . FLEXIBLE BRONCHOSCOPY N/A 03/17/2016   Procedure: FLEXIBLE BRONCHOSCOPY;  Surgeon: Wilhelmina Mcardle, MD;  Location: [facility];  Service: Pulmonary;  Laterality: N/A;  . KYPHOPLASTY N/A 07/13/2016   Procedure: KYPHOPLASTY;  Surgeon: Hessie Knows, MD;  Location: [facility];  Service: Orthopedics;  Laterality: N/A;  . PERIPHERAL VASCULAR CATHETERIZATION N/A 04/03/2016   Procedure: Glori Luis Cath Insertion;  Surgeon: Algernon Huxley, MD;  Location: [facility];  Service: Cardiovascular;  Laterality: N/A;    FAMILY HISTORY: Family History  Problem Relation Age of Onset  . Diabetes Mother   . Diabetes Father     ADVANCED DIRECTIVES (Y/N):  N  HEALTH MAINTENANCE: Social History  Substance Use Topics  . Smoking status: Former Smoker    Packs/day: 0.25    Types: Cigarettes  . Smokeless tobacco: Never Used     Comment: quit smoking 03/04/16  . Alcohol use No     Colonoscopy:  PAP:  Bone density:  Lipid panel:  Allergies  Allergen Reactions  . Hydrocodone Itching  and Rash  . Codeine Nausea Only  . Ultram [Tramadol] Itching    Current Outpatient Prescriptions  Medication Sig Dispense Refill  . albuterol (PROVENTIL HFA) 108 (90 Base) MCG/ACT inhaler Inhale 2 puffs into the lungs every 4 (four) hours as needed for wheezing or shortness of breath. 1 Inhaler 0  . amLODipine (NORVASC) 2.5 MG tablet Take 2.5 mg by mouth daily.    Marland Kitchen atorvastatin  (LIPITOR) 20 MG tablet Take 20 mg by mouth at bedtime.    . bisacodyl (DULCOLAX) 5 MG EC tablet Take 5 mg by mouth daily as needed for moderate constipation.    . calcium carbonate (OS-CAL) 600 MG TABS tablet Take 600 mg by mouth at bedtime.     . cyanocobalamin (,VITAMIN B-12,) 1000 MCG/ML injection Inject 1,000 mcg into the muscle every 30 (thirty) days.    . cyclobenzaprine (FLEXERIL) 10 MG tablet Take 1 tablet (10 mg total) by mouth 2 (two) times daily with a meal. (Patient taking differently: Take 10 mg by mouth 2 (two) times daily as needed for muscle spasms. ) 30 tablet 0  . levothyroxine (SYNTHROID, LEVOTHROID) 75 MCG tablet TAKE 1 TABLET EVERY DAY BEFORE BREAKFAST 90 tablet 1  . lidocaine-prilocaine (EMLA) cream Apply to port site 1 hour before port is accessed each time (Patient taking differently: Apply 1 application topically daily as needed (applied to port 1 hours before accessed). Apply to port site 1 hour before port is accessed each time) 30 g 3  . mometasone (NASONEX) 50 MCG/ACT nasal spray Place 2 sprays into the nose daily as needed (for rhinitis).     Marland Kitchen omeprazole (PRILOSEC) 20 MG capsule TAKE 1 CAPSULE (20 MG TOTAL) BY MOUTH DAILY. (Patient taking differently: TAKE 1 CAPSULE (20 MG TOTAL) BY MOUTH DAILY AS NEEDED FOR ACID REFLUX) 30 capsule 3  . ondansetron (ZOFRAN) 8 MG tablet Take 1 tablet (8 mg total) by mouth 2 (two) times daily as needed for refractory nausea / vomiting. 30 tablet 2  . potassium chloride SA (K-DUR,KLOR-CON) 20 MEQ tablet Take 1 tablet (20 mEq total) by mouth 2 (two) times daily. 60 tablet 1  . prochlorperazine (COMPAZINE) 10 MG tablet Take 1 tablet (10 mg total) by mouth every 6 (six) hours as needed (Nausea or vomiting). 30 tablet 1  . clopidogrel (PLAVIX) 75 MG tablet Take 75 mg by mouth at bedtime.    Marland Kitchen oxyCODONE-acetaminophen (ROXICET) 5-325 MG tablet Take 1 tablet by mouth every 4 (four) hours as needed for severe pain. (Patient not taking: Reported on  07/26/2016) 10 tablet 0   No current facility-administered medications for this visit.     OBJECTIVE: Vitals:   07/26/16 1022  BP: (!) 150/96  Pulse: (!) 110  Resp: 18  Temp: (!) 96.2 F (35.7 C)     Body mass index is 14.04 kg/m.    ECOG FS:2 - Symptomatic, <50% confined to bed  General: Thin, no acute distress. Eyes: Pink conjunctiva, anicteric sclera. Lungs: Clear to auscultation bilaterally. Heart: Regular rate and rhythm. No rubs, murmurs, or gallops. Abdomen: Mildly distended, nontender.. Musculoskeletal: No edema, cyanosis, or clubbing. Neuro: Alert, answering all questions appropriately. Cranial nerves grossly intact. Skin: No rashes or petechiae noted. Psych: Normal affect.   LAB RESULTS:  Lab Results  Component Value Date   NA 133 (L) 07/26/2016   K 4.2 07/26/2016   CL 101 07/26/2016   CO2 25 07/26/2016   GLUCOSE 89 07/26/2016   BUN 15 07/26/2016   CREATININE 0.84  07/26/2016   CALCIUM 8.7 (L) 07/26/2016   PROT 6.5 07/26/2016   ALBUMIN 3.5 07/26/2016   AST 20 07/26/2016   ALT 9 (L) 07/26/2016   ALKPHOS 88 07/26/2016   BILITOT 0.5 07/26/2016   GFRNONAA >60 07/26/2016   GFRAA >60 07/26/2016    Lab Results  Component Value Date   WBC 10.3 07/26/2016   NEUTROABS 9.0 (H) 07/26/2016   HGB 11.5 (L) 07/26/2016   HCT 34.6 (L) 07/26/2016   MCV 101.5 (H) 07/26/2016   PLT 230 07/26/2016     STUDIES: Dg Lumbar Spine 2-3 Views  Result Date: 07/13/2016 CLINICAL DATA:  Kyphoplasty EXAM: LUMBAR SPINE - 2-3 VIEW; DG C-ARM 61-120 MIN COMPARISON:  09/11/2013 FLUOROSCOPY TIME:  1 minutes 9 seconds Dose:  1.4 mGy Images obtained:  2 FINDINGS: Marked osseous demineralization. AP and lateral views of a section of the thoracolumbar spine are submitted. Unable to establish accurate spinal segment numbering due to lack of landmarks. Spinal augmentation procedures have been performed at 2 levels of the spine with a single intervening vertebral body of normal height. The  more superior of the 2 levels was performed previously, with the more caudal level new. Both vertebra demonstrate mild anterior height losses. IMPRESSION: Prior kyphoplasties at 2 levels of the thoracolumbar spine as above. Electronically Signed   By: Lavonia Dana M.D.   On: 07/13/2016 08:33   Nm Pet Image Restag (ps) Skull Base To Thigh  Result Date: 07/19/2016 CLINICAL DATA:  Subsequent treatment strategy for left-sided lung cancer. Known metastasis. EXAM: NUCLEAR MEDICINE PET SKULL BASE TO THIGH TECHNIQUE: 11.9 mCi F-18 FDG was injected intravenously. Full-ring PET imaging was performed from the skull base to thigh after the radiotracer. CT data was obtained and used for attenuation correction and anatomic localization. FASTING BLOOD GLUCOSE:  Value: 82 mg/dl COMPARISON:  03/20/2016 FINDINGS: NECK Resolution of right supraclavicular/ low jugular adenopathy. No new cervical nodes. Right carotid atherosclerosis. CHEST Marked improvement in appearance of the chest. Resolution of previously described left mediastinal adenopathy versus infiltrative mass. Residual left infrahilar soft tissue thickening measures a S.U.V. max of 2.5 including on image 94/series 4. More peripheral left lower lobe soft tissue thickening/nodularity measures 12 mm and a S.U.V. max of 3.4 on image 107/series 4. Mild centrilobular emphysema. Posterior left upper lobe interstitial thickening adjacent to major fissure thickening is unchanged. Trace left pleural fluid. Mild cardiomegaly with coronary artery atherosclerosis. ABDOMEN/PELVIS No abdominal parenchymal or nodal hypermetabolism. Abdominal aortic atherosclerosis. Normal adrenal glands. Mild degradation secondary to patient arm position. Cholecystectomy. SKELETON right sacral hypermetabolism is resolved. Mild sclerosis in this area on image 190/series 4. Vertebroplasty at L3, L1, and T11 with low-level hypermetabolism. Osteopenia. Lower right rib fractures. IMPRESSION: 1. Marked  response to therapy. 2. The only areas of residual soft tissue disease include mild left infrahilar soft tissue thickening and left lower lobe hypermetabolic soft tissue thickening/nodularity. Electronically Signed   By: Abigail Miyamoto M.D.   On: 07/19/2016 09:41   Dg C-arm 1-60 Min  Result Date: 07/13/2016 CLINICAL DATA:  Kyphoplasty EXAM: LUMBAR SPINE - 2-3 VIEW; DG C-ARM 61-120 MIN COMPARISON:  09/11/2013 FLUOROSCOPY TIME:  1 minutes 9 seconds Dose:  1.4 mGy Images obtained:  2 FINDINGS: Marked osseous demineralization. AP and lateral views of a section of the thoracolumbar spine are submitted. Unable to establish accurate spinal segment numbering due to lack of landmarks. Spinal augmentation procedures have been performed at 2 levels of the spine with a single intervening vertebral body of normal  height. The more superior of the 2 levels was performed previously, with the more caudal level new. Both vertebra demonstrate mild anterior height losses. IMPRESSION: Prior kyphoplasties at 2 levels of the thoracolumbar spine as above. Electronically Signed   By: Lavonia Dana M.D.   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 Patient likely has residual disease and will require additional chemotherapy in the future, but given her declining performance status it was decided to hold treatment at this time. Patient will return to clinic in 6 weeks with repeat CT scan and further evaluation. If she has progression of disease, would continue carboplatinum and etoposide as previous.  2. Headaches: MRI negative for metastasis, monitor. 3. Pain: Patient refuses to take her narcotics as prescribed. She was given a prescription for tramadol today.  4. Weight loss: Likely  multifactorial including possible underlying malignancy. Consider Megace in the future. 5. Thrombocytopenia: Resolved. Secondary chemotherapy, monitor.  6. Leukocytosis: Resolved.   Patient expressed understanding and was in agreement with this plan. She also understands that She can call clinic at any time with any questions, concerns, or complaints.   Cancer Staging Primary cancer of left lung metastatic to other site [facility]) Staging form: Lung, AJCC 7th Edition - Clinical stage from 03/12/2016: Stage IV (T3, N3, M1a) - Signed by Lloyd Huger, MD on 03/26/2016   Lloyd Huger, MD   07/26/2016 10:26 AM

## 2016-07-26 ENCOUNTER — Inpatient Hospital Stay: Payer: Medicare HMO

## 2016-07-26 ENCOUNTER — Inpatient Hospital Stay: Payer: Medicare HMO | Attending: Oncology

## 2016-07-26 ENCOUNTER — Inpatient Hospital Stay (HOSPITAL_BASED_OUTPATIENT_CLINIC_OR_DEPARTMENT_OTHER): Payer: Medicare HMO | Admitting: Oncology

## 2016-07-26 VITALS — BP 150/96 | HR 110 | Temp 96.2°F | Resp 18 | Wt 79.3 lb

## 2016-07-26 DIAGNOSIS — E538 Deficiency of other specified B group vitamins: Secondary | ICD-10-CM | POA: Insufficient documentation

## 2016-07-26 DIAGNOSIS — M818 Other osteoporosis without current pathological fracture: Secondary | ICD-10-CM | POA: Diagnosis not present

## 2016-07-26 DIAGNOSIS — J449 Chronic obstructive pulmonary disease, unspecified: Secondary | ICD-10-CM

## 2016-07-26 DIAGNOSIS — R634 Abnormal weight loss: Secondary | ICD-10-CM | POA: Diagnosis not present

## 2016-07-26 DIAGNOSIS — Z8673 Personal history of transient ischemic attack (TIA), and cerebral infarction without residual deficits: Secondary | ICD-10-CM

## 2016-07-26 DIAGNOSIS — M5030 Other cervical disc degeneration, unspecified cervical region: Secondary | ICD-10-CM | POA: Insufficient documentation

## 2016-07-26 DIAGNOSIS — F419 Anxiety disorder, unspecified: Secondary | ICD-10-CM | POA: Diagnosis not present

## 2016-07-26 DIAGNOSIS — I6521 Occlusion and stenosis of right carotid artery: Secondary | ICD-10-CM | POA: Insufficient documentation

## 2016-07-26 DIAGNOSIS — Z9049 Acquired absence of other specified parts of digestive tract: Secondary | ICD-10-CM

## 2016-07-26 DIAGNOSIS — I714 Abdominal aortic aneurysm, without rupture: Secondary | ICD-10-CM | POA: Insufficient documentation

## 2016-07-26 DIAGNOSIS — N189 Chronic kidney disease, unspecified: Secondary | ICD-10-CM | POA: Diagnosis not present

## 2016-07-26 DIAGNOSIS — C7801 Secondary malignant neoplasm of right lung: Secondary | ICD-10-CM | POA: Diagnosis not present

## 2016-07-26 DIAGNOSIS — Z8701 Personal history of pneumonia (recurrent): Secondary | ICD-10-CM | POA: Diagnosis not present

## 2016-07-26 DIAGNOSIS — R531 Weakness: Secondary | ICD-10-CM | POA: Diagnosis not present

## 2016-07-26 DIAGNOSIS — E785 Hyperlipidemia, unspecified: Secondary | ICD-10-CM | POA: Insufficient documentation

## 2016-07-26 DIAGNOSIS — Z8781 Personal history of (healed) traumatic fracture: Secondary | ICD-10-CM | POA: Diagnosis not present

## 2016-07-26 DIAGNOSIS — C3492 Malignant neoplasm of unspecified part of left bronchus or lung: Secondary | ICD-10-CM

## 2016-07-26 DIAGNOSIS — R0602 Shortness of breath: Secondary | ICD-10-CM | POA: Insufficient documentation

## 2016-07-26 DIAGNOSIS — R32 Unspecified urinary incontinence: Secondary | ICD-10-CM | POA: Insufficient documentation

## 2016-07-26 DIAGNOSIS — C3402 Malignant neoplasm of left main bronchus: Secondary | ICD-10-CM | POA: Insufficient documentation

## 2016-07-26 DIAGNOSIS — R5383 Other fatigue: Secondary | ICD-10-CM | POA: Diagnosis not present

## 2016-07-26 DIAGNOSIS — Z87891 Personal history of nicotine dependence: Secondary | ICD-10-CM | POA: Insufficient documentation

## 2016-07-26 DIAGNOSIS — Z8619 Personal history of other infectious and parasitic diseases: Secondary | ICD-10-CM | POA: Insufficient documentation

## 2016-07-26 DIAGNOSIS — E039 Hypothyroidism, unspecified: Secondary | ICD-10-CM

## 2016-07-26 DIAGNOSIS — F329 Major depressive disorder, single episode, unspecified: Secondary | ICD-10-CM | POA: Insufficient documentation

## 2016-07-26 DIAGNOSIS — R51 Headache: Secondary | ICD-10-CM | POA: Diagnosis not present

## 2016-07-26 LAB — CBC WITH DIFFERENTIAL/PLATELET
Basophils Absolute: 0.1 10*3/uL (ref 0–0.1)
Basophils Relative: 1 %
Eosinophils Absolute: 0.1 10*3/uL (ref 0–0.7)
Eosinophils Relative: 1 %
HEMATOCRIT: 34.6 % — AB (ref 35.0–47.0)
HEMOGLOBIN: 11.5 g/dL — AB (ref 12.0–16.0)
LYMPHS ABS: 0.8 10*3/uL — AB (ref 1.0–3.6)
Lymphocytes Relative: 7 %
MCH: 33.8 pg (ref 26.0–34.0)
MCHC: 33.3 g/dL (ref 32.0–36.0)
MCV: 101.5 fL — ABNORMAL HIGH (ref 80.0–100.0)
MONOS PCT: 4 %
Monocytes Absolute: 0.4 10*3/uL (ref 0.2–0.9)
NEUTROS PCT: 87 %
Neutro Abs: 9 10*3/uL — ABNORMAL HIGH (ref 1.4–6.5)
Platelets: 230 10*3/uL (ref 150–440)
RBC: 3.41 MIL/uL — AB (ref 3.80–5.20)
RDW: 15.9 % — ABNORMAL HIGH (ref 11.5–14.5)
WBC: 10.3 10*3/uL (ref 3.6–11.0)

## 2016-07-26 LAB — COMPREHENSIVE METABOLIC PANEL
ALK PHOS: 88 U/L (ref 38–126)
ALT: 9 U/L — AB (ref 14–54)
ANION GAP: 7 (ref 5–15)
AST: 20 U/L (ref 15–41)
Albumin: 3.5 g/dL (ref 3.5–5.0)
BUN: 15 mg/dL (ref 6–20)
CALCIUM: 8.7 mg/dL — AB (ref 8.9–10.3)
CO2: 25 mmol/L (ref 22–32)
CREATININE: 0.84 mg/dL (ref 0.44–1.00)
Chloride: 101 mmol/L (ref 101–111)
Glucose, Bld: 89 mg/dL (ref 65–99)
Potassium: 4.2 mmol/L (ref 3.5–5.1)
Sodium: 133 mmol/L — ABNORMAL LOW (ref 135–145)
TOTAL PROTEIN: 6.5 g/dL (ref 6.5–8.1)
Total Bilirubin: 0.5 mg/dL (ref 0.3–1.2)

## 2016-07-26 MED ORDER — HEPARIN SOD (PORK) LOCK FLUSH 100 UNIT/ML IV SOLN
500.0000 [IU] | Freq: Once | INTRAVENOUS | Status: AC
  Administered 2016-07-26: 500 [IU] via INTRAVENOUS
  Filled 2016-07-26: qty 5

## 2016-07-26 MED ORDER — TRAMADOL HCL 50 MG PO TABS: 50.0000 mg | ORAL_TABLET | Freq: Four times a day (QID) | ORAL | 0 refills | Status: DC | PRN

## 2016-07-26 NOTE — Progress Notes (Signed)
Complains of severe back pain, requests IV pain medication today. Pt states only takes tylenol for pain.

## 2016-07-27 ENCOUNTER — Inpatient Hospital Stay: Payer: Medicare HMO

## 2016-07-28 ENCOUNTER — Inpatient Hospital Stay: Payer: Medicare HMO

## 2016-08-02 DIAGNOSIS — Z9889 Other specified postprocedural states: Secondary | ICD-10-CM | POA: Diagnosis not present

## 2016-08-02 NOTE — Patient Instructions (Signed)
  Your procedure is scheduled on: 08/03/16 Report to Day Surgery.MEDICAL [facility] SECOND FLOOR   Remember: Instructions that are not followed completely may result in serious medical risk, up to and including death, or upon the discretion of your surgeon and anesthesiologist your surgery may need to be rescheduled.    ___X_ 1. Do not eat food or drink liquids after midnight. No gum chewing or hard candies.     ____ 2. No Alcohol for 24 hours before or after surgery.   ____ 3. Do Not Smoke For 24 Hours Prior to Your Surgery.   ____ 4. Bring all medications with you on the day of surgery if instructed.    __X__ 5. Notify your doctor if there is any change in your medical condition     (cold, fever, infections).       Do not wear jewelry, make-up, hairpins, clips or nail polish.  Do not wear lotions, powders, or perfumes. You may wear deodorant.  Do not shave 48 hours prior to surgery. Men may shave face and neck.  Do not bring valuables to the hospital.    [facility] is not responsible for any belongings or valuables.               Contacts, dentures or bridgework may not be worn into surgery.  Leave your suitcase in the car. After surgery it may be brought to your room.  For patients admitted to the hospital, discharge time is determined by your                treatment team.   Patients discharged the day of surgery will not be allowed to drive home.   __X__ Take these medicines the morning of surgery with A SIP OF WATER:    1. OMEPRAZOLE BEDTIME 08/02/16 AND AM SURGERY  2. CAN TAKE ATIVAN IF NEEDED  3.   4.  5.  6.  ____ Fleet Enema (as directed)   ____ Use CHG Soap as directed  _X___ Use inhalers on the day of surgery  ____ Stop metformin 2 days prior to surgery    ____ Take 1/2 of usual insulin dose the night before surgery and none on the morning of surgery.   ____ Stop Coumadin/Plavix/aspirin on  ____ Stop Anti-inflammatories on    ____ Stop supplements until after  surgery.    ____ Bring C-Pap to the hospital.

## 2016-08-03 ENCOUNTER — Encounter
Admission: RE | Disposition: A | Discharge status: Home / Self Care | Payer: Self-pay | Source: Ambulatory Visit | Attending: Orthopedic Surgery

## 2016-08-03 ENCOUNTER — Encounter: Payer: Self-pay | Admitting: *Deleted

## 2016-08-03 ENCOUNTER — Ambulatory Visit: Payer: Medicare HMO | Admitting: Anesthesiology

## 2016-08-03 ENCOUNTER — Ambulatory Visit: Payer: Medicare HMO

## 2016-08-03 ENCOUNTER — Ambulatory Visit
Admission: RE | Admit: 2016-08-03 | Discharge: 2016-08-03 | Disposition: A | Discharge status: Home / Self Care | Payer: Medicare HMO | Source: Ambulatory Visit | Attending: Orthopedic Surgery | Admitting: Orthopedic Surgery

## 2016-08-03 DIAGNOSIS — Z79899 Other long term (current) drug therapy: Secondary | ICD-10-CM | POA: Insufficient documentation

## 2016-08-03 DIAGNOSIS — M4854XA Collapsed vertebra, not elsewhere classified, thoracic region, initial encounter for fracture: Secondary | ICD-10-CM | POA: Insufficient documentation

## 2016-08-03 DIAGNOSIS — M4856XA Collapsed vertebra, not elsewhere classified, lumbar region, initial encounter for fracture: Secondary | ICD-10-CM | POA: Diagnosis not present

## 2016-08-03 DIAGNOSIS — F329 Major depressive disorder, single episode, unspecified: Secondary | ICD-10-CM | POA: Diagnosis not present

## 2016-08-03 DIAGNOSIS — E039 Hypothyroidism, unspecified: Secondary | ICD-10-CM | POA: Diagnosis not present

## 2016-08-03 DIAGNOSIS — C3490 Malignant neoplasm of unspecified part of unspecified bronchus or lung: Secondary | ICD-10-CM | POA: Diagnosis not present

## 2016-08-03 DIAGNOSIS — I739 Peripheral vascular disease, unspecified: Secondary | ICD-10-CM | POA: Insufficient documentation

## 2016-08-03 DIAGNOSIS — J449 Chronic obstructive pulmonary disease, unspecified: Secondary | ICD-10-CM | POA: Insufficient documentation

## 2016-08-03 DIAGNOSIS — I10 Essential (primary) hypertension: Secondary | ICD-10-CM | POA: Diagnosis not present

## 2016-08-03 DIAGNOSIS — M810 Age-related osteoporosis without current pathological fracture: Secondary | ICD-10-CM | POA: Diagnosis not present

## 2016-08-03 DIAGNOSIS — S22000A Wedge compression fracture of unspecified thoracic vertebra, initial encounter for closed fracture: Secondary | ICD-10-CM | POA: Diagnosis not present

## 2016-08-03 DIAGNOSIS — E785 Hyperlipidemia, unspecified: Secondary | ICD-10-CM | POA: Insufficient documentation

## 2016-08-03 DIAGNOSIS — S32010A Wedge compression fracture of first lumbar vertebra, initial encounter for closed fracture: Secondary | ICD-10-CM | POA: Diagnosis not present

## 2016-08-03 DIAGNOSIS — Z419 Encounter for procedure for purposes other than remedying health state, unspecified: Secondary | ICD-10-CM

## 2016-08-03 DIAGNOSIS — F419 Anxiety disorder, unspecified: Secondary | ICD-10-CM | POA: Diagnosis not present

## 2016-08-03 DIAGNOSIS — Z87891 Personal history of nicotine dependence: Secondary | ICD-10-CM | POA: Insufficient documentation

## 2016-08-03 DIAGNOSIS — Z462 Encounter for fitting and adjustment of other devices related to nervous system and special senses: Secondary | ICD-10-CM | POA: Diagnosis not present

## 2016-08-03 DIAGNOSIS — Z7902 Long term (current) use of antithrombotics/antiplatelets: Secondary | ICD-10-CM | POA: Insufficient documentation

## 2016-08-03 HISTORY — PX: KYPHOPLASTY: SHX5884

## 2016-08-03 SURGERY — KYPHOPLASTY
Anesthesia: Monitor Anesthesia Care | Wound class: Clean

## 2016-08-03 MED ORDER — SODIUM CHLORIDE 0.9 % IV SOLN: INTRAVENOUS | Status: DC

## 2016-08-03 MED ORDER — LABETALOL HCL 5 MG/ML IV SOLN
INTRAVENOUS | Status: AC
  Administered 2016-08-03: 5 mg via INTRAVENOUS
  Filled 2016-08-03: qty 4

## 2016-08-03 MED ORDER — LABETALOL HCL 5 MG/ML IV SOLN
5.0000 mg | INTRAVENOUS | Status: DC | PRN
  Administered 2016-08-03 (×3): 5 mg via INTRAVENOUS

## 2016-08-03 MED ORDER — ONDANSETRON HCL 4 MG/2ML IJ SOLN: 4.0000 mg | Freq: Once | INTRAMUSCULAR | Status: DC | PRN

## 2016-08-03 MED ORDER — PROPOFOL 500 MG/50ML IV EMUL
INTRAVENOUS | Status: DC | PRN
  Administered 2016-08-03: 15 ug/kg/min via INTRAVENOUS

## 2016-08-03 MED ORDER — FENTANYL CITRATE (PF) 100 MCG/2ML IJ SOLN: 25.0000 ug | INTRAMUSCULAR | Status: DC | PRN

## 2016-08-03 MED ORDER — LACTATED RINGERS IV SOLN
INTRAVENOUS | Status: DC
  Administered 2016-08-03 (×2): via INTRAVENOUS

## 2016-08-03 MED ORDER — FENTANYL CITRATE (PF) 100 MCG/2ML IJ SOLN
INTRAMUSCULAR | Status: AC
  Filled 2016-08-03: qty 2

## 2016-08-03 MED ORDER — CEFAZOLIN IN D5W 1 GM/50ML IV SOLN
1.0000 g | Freq: Once | INTRAVENOUS | Status: AC
  Administered 2016-08-03: 1 g via INTRAVENOUS

## 2016-08-03 MED ORDER — FENTANYL CITRATE (PF) 100 MCG/2ML IJ SOLN
INTRAMUSCULAR | Status: DC | PRN
  Administered 2016-08-03 (×4): 25 ug via INTRAVENOUS

## 2016-08-03 MED ORDER — ONDANSETRON HCL 4 MG/2ML IJ SOLN: 4.0000 mg | Freq: Four times a day (QID) | INTRAMUSCULAR | Status: DC | PRN

## 2016-08-03 MED ORDER — METOCLOPRAMIDE HCL 10 MG PO TABS: 5.0000 mg | ORAL_TABLET | Freq: Three times a day (TID) | ORAL | Status: DC | PRN

## 2016-08-03 MED ORDER — LIDOCAINE HCL 1 % IJ SOLN
INTRAMUSCULAR | Status: DC | PRN
  Administered 2016-08-03: 30 mL

## 2016-08-03 MED ORDER — IOPAMIDOL (ISOVUE-M 200) INJECTION 41%
INTRAMUSCULAR | Status: DC | PRN
  Administered 2016-08-03: 20 mL

## 2016-08-03 MED ORDER — BUPIVACAINE-EPINEPHRINE (PF) 0.5% -1:200000 IJ SOLN
INTRAMUSCULAR | Status: DC | PRN
  Administered 2016-08-03: 20 mL

## 2016-08-03 MED ORDER — PROPOFOL 10 MG/ML IV BOLUS
INTRAVENOUS | Status: DC | PRN
  Administered 2016-08-03 (×2): 10 mg via INTRAVENOUS

## 2016-08-03 MED ORDER — PROPOFOL 500 MG/50ML IV EMUL
INTRAVENOUS | Status: AC
  Filled 2016-08-03: qty 50

## 2016-08-03 MED ORDER — METOCLOPRAMIDE HCL 5 MG/ML IJ SOLN: 5.0000 mg | Freq: Three times a day (TID) | INTRAMUSCULAR | Status: DC | PRN

## 2016-08-03 MED ORDER — ONDANSETRON HCL 4 MG PO TABS: 4.0000 mg | ORAL_TABLET | Freq: Four times a day (QID) | ORAL | Status: DC | PRN

## 2016-08-03 SURGICAL SUPPLY — 15 items
CEMENT KYPHON CX01A KIT/MIXER (Cement) ×3 IMPLANT
DEVICE BIOPSY BONE KYPHX (INSTRUMENTS) ×3 IMPLANT
DRAPE C-ARM XRAY 36X54 (DRAPES) ×3 IMPLANT
DURAPREP 26ML APPLICATOR (WOUND CARE) ×3 IMPLANT
GLOVE SURG SYN 9.0  PF PI (GLOVE) ×2
GLOVE SURG SYN 9.0 PF PI (GLOVE) ×1 IMPLANT
GOWN SRG 2XL LVL 4 RGLN SLV (GOWNS) ×1 IMPLANT
GOWN STRL NON-REIN 2XL LVL4 (GOWNS) ×2
GOWN STRL REUS W/ TWL LRG LVL3 (GOWN DISPOSABLE) ×1 IMPLANT
GOWN STRL REUS W/TWL LRG LVL3 (GOWN DISPOSABLE) ×2
LIQUID BAND (GAUZE/BANDAGES/DRESSINGS) ×3 IMPLANT
PACK KYPHOPLASTY (MISCELLANEOUS) ×3 IMPLANT
STRAP SAFETY BODY (MISCELLANEOUS) ×3 IMPLANT
TRAY KYPHOPAK 15/3 EXPRESS 1ST (MISCELLANEOUS) IMPLANT
TRAY KYPHOPAK 20/3 EXPRESS 1ST (MISCELLANEOUS) ×3 IMPLANT

## 2016-08-03 NOTE — Discharge Instructions (Addendum)
Activities as tolerated starting tomorrow. Remove Band-Aid's on Saturday, okay to shower after that. Call for any problems.  AMBULATORY SURGERY  DISCHARGE INSTRUCTIONS   1) The drugs that you were given will stay in your system until tomorrow so for the next 24 hours you should not:  A) Drive an automobile B) Make any legal decisions C) Drink any alcoholic beverage   2) You may resume regular meals tomorrow.  Today it is better to start with liquids and gradually work up to solid foods.  You may eat anything you prefer, but it is better to start with liquids, then soup and crackers, and gradually work up to solid foods.   3) Please notify your doctor immediately if you have any unusual bleeding, trouble breathing, redness and pain at the surgery site, drainage, fever, or pain not relieved by medication.    4) Additional Instructions:        Please contact your physician with any problems or Same Day Surgery at [phone], Monday through Friday 6 am to 4 pm, or [place] at [facility] number at [phone].

## 2016-08-03 NOTE — H&P (Signed)
Reviewed paper H+P, will be scanned into chart. No changes noted.  

## 2016-08-03 NOTE — Op Note (Signed)
08/03/2016  1:16 PM  PATIENT:  Melody Green  76 y.o. female  PRE-OPERATIVE DIAGNOSIS:  COMPRESSION FRACTURE T12, L2  POST-OPERATIVE DIAGNOSIS:  COMPRESSION FRACTURE T12, L2  PROCEDURE:  Procedure(s): KYPHOPLASTY T12, L2 (N/A)  SURGEON: Laurene Footman, MD  ASSISTANTS: None  ANESTHESIA:   local and MAC  EBL:  Total I/O In: 400 [I.V.:400] Out: 0   BLOOD ADMINISTERED:none  DRAINS: none   LOCAL MEDICATIONS USED:  MARCAINE    and XYLOCAINE   SPECIMEN:  Source of Specimen:  T12 vertebral body  DISPOSITION OF SPECIMEN:  PATHOLOGY  COUNTS:  YES  TOURNIQUET:  * No tourniquets in log *  IMPLANTS: Bone cement  DICTATION: .Dragon Dictation patient brought the operating room and after adequate anesthesia was obtained the patient was placed prone and C-arm was brought in and good visualization of both affected levels. After patient identification timeout procedure were completed skin was prepped with this chlorhexidine and local anesthetic was infiltrated subcutaneously with 1% Xylocaine. The back was then prepped and draped in sterile fashion and repeat timeout procedure carried out. Spinal needle was used to get local asthenic down to the pedicle on the right side at T12 and L2. After allowing this to set a small incision was made and a trocar advanced into the vertebral body. At L2 there was a significant positional correction of the deformity and the vertebral body "quite a bit no put no biopsy was obtained at that level but there was a biopsy obtained at T12 area of the trochars were advanced care being taken to stay lateral to the medial wall the pedicle as it was being advanced on the lateral view. Drilling was carried out followed by a balloon inflation about 2 cc at each level. Cement was mixed and when it was the appropriate consistency was infiltrated in the vertebral bodies without 3 cc infiltrated at T12 and 4 at L2 with good interdigitation. After the cemented set trochars removed  and permanent C-arm views obtained. Dermabond was used to close the skin followed by a Band-Aid's  PLAN OF CARE: Discharge to home after PACU  PATIENT DISPOSITION:  PACU - hemodynamically stable.

## 2016-08-03 NOTE — Anesthesia Preprocedure Evaluation (Addendum)
Anesthesia Evaluation  Patient identified by MRN, date of birth, ID band Patient awake    Reviewed: Allergy & Precautions, NPO status , Patient's Chart, lab work & pertinent test results, reviewed documented beta blocker date and time   Airway Mallampati: II  TM Distance: >3 FB     Dental  (+) Chipped, Upper Dentures, Partial Lower   Pulmonary pneumonia, resolved, COPD, former smoker,           Cardiovascular hypertension, + Peripheral Vascular Disease       Neuro/Psych PSYCHIATRIC DISORDERS Anxiety Depression CVA    GI/Hepatic   Endo/Other  Hypothyroidism   Renal/GU Renal disease     Musculoskeletal  (+) Arthritis ,   Abdominal   Peds  Hematology   Anesthesia Other Findings Lung ca. No hx of stroke. Possible TIA.  Reproductive/Obstetrics                           Anesthesia Physical Anesthesia Plan  ASA: III  Anesthesia Plan: MAC   Post-op Pain Management:    Induction:   Airway Management Planned:   Additional Equipment:   Intra-op Plan:   Post-operative Plan:   Informed Consent: I have reviewed the patients History and Physical, chart, labs and discussed the procedure including the risks, benefits and alternatives for the proposed anesthesia with the patient or authorized representative who has indicated his/her understanding and acceptance.     Plan Discussed with: CRNA  Anesthesia Plan Comments:         Anesthesia Quick Evaluation

## 2016-08-03 NOTE — Anesthesia Post-op Follow-up Note (Cosign Needed)
Anesthesia QCDR form completed.        

## 2016-08-03 NOTE — Transfer of Care (Signed)
Immediate Anesthesia Transfer of Care Note  Patient: Melody Green  Procedure(s) Performed: Procedure(s): KYPHOPLASTY T12, L2 (N/A)  Patient Location: PACU  Anesthesia Type:MAC  Level of Consciousness: awake, alert  and oriented  Airway & Oxygen Therapy: Patient Spontanous Breathing  Post-op Assessment: Report given to RN and Post -op Vital signs reviewed and stable  Post vital signs: Reviewed and stable  Last Vitals:  Vitals:   08/03/16 1039  BP: (!) 151/98  Pulse: (!) 101  Resp: 20  Temp: 36.1 C    Last Pain:  Vitals:   08/03/16 1039  TempSrc: Tympanic  PainSc: 10-Worst pain ever         Complications: No apparent anesthesia complications

## 2016-08-04 LAB — SURGICAL PATHOLOGY

## 2016-08-04 NOTE — Anesthesia Postprocedure Evaluation (Signed)
Anesthesia Post Note  Patient: Melody Green  Procedure(s) Performed: Procedure(s) (LRB): KYPHOPLASTY T12, L2 (N/A)  Patient location during evaluation: PACU Anesthesia Type: MAC Level of consciousness: awake and alert Pain management: pain level controlled Vital Signs Assessment: post-procedure vital signs reviewed and stable Respiratory status: spontaneous breathing, nonlabored ventilation, respiratory function stable and patient connected to nasal cannula oxygen Cardiovascular status: stable and blood pressure returned to baseline Anesthetic complications: no     Last Vitals:  Vitals:   08/03/16 1406 08/03/16 1434  BP: (!) 146/92 (!) 148/86  Pulse: 70 71  Resp: 16 16  Temp: 36.2 C     Last Pain:  Vitals:   08/04/16 0848  TempSrc:   PainSc: [place]

## 2016-08-08 ENCOUNTER — Encounter (INDEPENDENT_AMBULATORY_CARE_PROVIDER_SITE_OTHER): Payer: Self-pay

## 2016-08-10 ENCOUNTER — Other Ambulatory Visit (INDEPENDENT_AMBULATORY_CARE_PROVIDER_SITE_OTHER): Payer: Medicare HMO

## 2016-08-10 ENCOUNTER — Other Ambulatory Visit (INDEPENDENT_AMBULATORY_CARE_PROVIDER_SITE_OTHER): Payer: Self-pay | Admitting: Vascular Surgery

## 2016-08-10 DIAGNOSIS — I700 Atherosclerosis of aorta: Secondary | ICD-10-CM

## 2016-08-15 ENCOUNTER — Encounter (INDEPENDENT_AMBULATORY_CARE_PROVIDER_SITE_OTHER): Payer: Self-pay

## 2016-08-21 DIAGNOSIS — Z9889 Other specified postprocedural states: Secondary | ICD-10-CM | POA: Diagnosis not present

## 2016-08-22 ENCOUNTER — Encounter (INDEPENDENT_AMBULATORY_CARE_PROVIDER_SITE_OTHER): Payer: Self-pay

## 2016-08-22 ENCOUNTER — Ambulatory Visit (INDEPENDENT_AMBULATORY_CARE_PROVIDER_SITE_OTHER): Payer: Self-pay | Admitting: Vascular Surgery

## 2016-09-08 ENCOUNTER — Ambulatory Visit
Admission: RE | Admit: 2016-09-08 | Discharge: 2016-09-08 | Disposition: A | Discharge status: Home / Self Care | Payer: Medicare HMO | Source: Ambulatory Visit | Attending: Oncology | Admitting: Oncology

## 2016-09-08 DIAGNOSIS — I77810 Thoracic aortic ectasia: Secondary | ICD-10-CM | POA: Diagnosis not present

## 2016-09-08 DIAGNOSIS — I700 Atherosclerosis of aorta: Secondary | ICD-10-CM | POA: Insufficient documentation

## 2016-09-08 DIAGNOSIS — I2510 Atherosclerotic heart disease of native coronary artery without angina pectoris: Secondary | ICD-10-CM | POA: Insufficient documentation

## 2016-09-08 DIAGNOSIS — Z85118 Personal history of other malignant neoplasm of bronchus and lung: Secondary | ICD-10-CM | POA: Diagnosis not present

## 2016-09-08 DIAGNOSIS — C3492 Malignant neoplasm of unspecified part of left bronchus or lung: Secondary | ICD-10-CM

## 2016-09-08 LAB — POCT I-STAT CREATININE: CREATININE: 0.9 mg/dL (ref 0.44–1.00)

## 2016-09-08 MED ORDER — IOPAMIDOL (ISOVUE-300) INJECTION 61%
50.0000 mL | Freq: Once | INTRAVENOUS | Status: AC | PRN
  Administered 2016-09-08: 50 mL via INTRAVENOUS

## 2016-09-10 NOTE — Progress Notes (Signed)
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  INTERVAL HISTORY: Patient returns to clinic today for further evaluation and in discussion of her imaging results. She continues to have significant back pain that is being addressed by orthopedics. She has a good appetite, but continues to lose weight. She continues to have chronic shortness of breath. She continues to have weakness and fatigue, but this is relatively unchanged. She denies any fevers or illnesses. She denies any chest pain, cough, or hemoptysis. She has a fair appetite. She has no nausea, vomiting, constipation, or diarrhea. She has no urinary complaints. Patient offers no further specific complaints.  REVIEW OF SYSTEMS:   Review of Systems  Constitutional: Positive for malaise/fatigue. Negative for fever and weight loss.  Eyes: Negative.  Negative for blurred vision and double vision.  Respiratory: Positive for shortness of breath. Negative for cough and hemoptysis.   Cardiovascular: Negative for chest pain and leg swelling.  Gastrointestinal: Negative.  Negative for abdominal pain.  Genitourinary: Negative.   Musculoskeletal: Positive for back pain.  Neurological: Positive for weakness. Negative for focal weakness and headaches.  Psychiatric/Behavioral: Negative.  The patient is not nervous/anxious.     As per HPI. Otherwise, a complete review of systems is negative.  PAST MEDICAL HISTORY: Past Medical History:  Diagnosis Date  . Abdominal aneurysm ([place])   . Anxiety   . Cancer ([place]) 03/10/2016   lung  .  Chronic kidney disease   . Collagenous colitis   . COPD (chronic obstructive pulmonary disease) ([place])   . DDD (degenerative disc disease), cervical   . Depression   . Hernia of abdominal cavity   . Hyperlipidemia   . Hypertension   . Hypothyroidism   . Osteoporosis   . Pneumonia   . Stroke ([place])   . Tobacco abuse   . Urine incontinence   . Vitamin B 12 deficiency     PAST SURGICAL HISTORY: Past Surgical History:  Procedure Laterality Date  . ABDOMINAL AORTIC ANEURYSM REPAIR    . ABDOMINAL HYSTERECTOMY    . APPENDECTOMY    . BACK SURGERY  [phone]  . CHOLECYSTECTOMY    . FLEXIBLE BRONCHOSCOPY N/A 03/17/2016   Procedure: FLEXIBLE BRONCHOSCOPY;  Surgeon: Wilhelmina Mcardle, MD;  Location: [facility];  Service: Pulmonary;  Laterality: N/A;  . KYPHOPLASTY N/A 07/13/2016   Procedure: KYPHOPLASTY;  Surgeon: Hessie Knows, MD;  Location: [facility];  Service: Orthopedics;  Laterality: N/A;  . KYPHOPLASTY N/A 08/03/2016   Procedure: KYPHOPLASTY T12, L2;  Surgeon: Hessie Knows, MD;  Location: [facility];  Service: Orthopedics;  Laterality: N/A;  . PERIPHERAL VASCULAR CATHETERIZATION N/A 04/03/2016   Procedure: Glori Luis Cath Insertion;  Surgeon: Algernon Huxley, MD;  Location: [facility];  Service: Cardiovascular;  Laterality: N/A;    FAMILY HISTORY: Family History  Problem Relation Age of Onset  . Diabetes Mother   . Diabetes Father     ADVANCED DIRECTIVES (Y/N):  N  HEALTH MAINTENANCE: Social History  Substance Use Topics  . Smoking status: Former Smoker    Packs/day: 0.25    Types: Cigarettes  . Smokeless tobacco: Never  Used     Comment: quit smoking 03/04/16  . Alcohol use No     Colonoscopy:  PAP:  Bone density:  Lipid panel:  Allergies  Allergen Reactions  . Hydrocodone Itching and Rash  . Codeine Rash  . Oxycodone Itching and Rash    Current Outpatient Prescriptions  Medication Sig Dispense Refill  . acetaminophen (TYLENOL) 500 MG tablet Take 1,000 mg by mouth  every 6 (six) hours as needed for moderate pain or headache.    . albuterol (PROVENTIL HFA) 108 (90 Base) MCG/ACT inhaler Inhale 2 puffs into the lungs every 4 (four) hours as needed for wheezing or shortness of breath. 1 Inhaler 0  . amLODipine (NORVASC) 2.5 MG tablet Take 2.5 mg by mouth daily.    Marland Kitchen atorvastatin (LIPITOR) 20 MG tablet Take 20 mg by mouth at bedtime.    . bisacodyl (DULCOLAX) 5 MG EC tablet Take 5 mg by mouth daily as needed for moderate constipation.    . calcium carbonate (OS-CAL) 600 MG TABS tablet Take 600 mg by mouth 2 (two) times daily.     . clopidogrel (PLAVIX) 75 MG tablet Take 75 mg by mouth at bedtime.    . cyanocobalamin (,VITAMIN B-12,) 1000 MCG/ML injection Inject 1,000 mcg into the muscle every 30 (thirty) days.    . cyclobenzaprine (FLEXERIL) 10 MG tablet Take 1 tablet (10 mg total) by mouth 2 (two) times daily with a meal. (Patient taking differently: Take 10 mg by mouth every 8 (eight) hours as needed for muscle spasms. ) 30 tablet 0  . levothyroxine (SYNTHROID, LEVOTHROID) 75 MCG tablet TAKE 1 TABLET EVERY DAY BEFORE BREAKFAST 90 tablet 1  . lidocaine-prilocaine (EMLA) cream Apply to port site 1 hour before port is accessed each time (Patient taking differently: Apply 1 application topically daily as needed (applied to port 1 hours before accessed). Apply to port site 1 hour before port is accessed each time) 30 g 3  . LORazepam (ATIVAN) 1 MG tablet Take 1 mg by mouth 2 (two) times daily as needed for anxiety.    . mometasone (NASONEX) 50 MCG/ACT nasal spray Place 2 sprays into the nose daily as needed (for rhinitis).     Marland Kitchen omeprazole (PRILOSEC) 20 MG capsule TAKE 1 CAPSULE (20 MG TOTAL) BY MOUTH DAILY. (Patient taking differently: TAKE 1 CAPSULE (20 MG TOTAL) BY MOUTH DAILY AS NEEDED FOR ACID REFLUX) 30 capsule 3  . ondansetron (ZOFRAN) 8 MG tablet Take 1 tablet (8 mg total) by mouth 2 (two) times daily as needed for refractory nausea / vomiting. 30 tablet 2  .  prochlorperazine (COMPAZINE) 10 MG tablet Take 1 tablet (10 mg total) by mouth every 6 (six) hours as needed (Nausea or vomiting). 30 tablet 1  . traMADol (ULTRAM) 50 MG tablet Take 1 tablet (50 mg total) by mouth every 6 (six) hours as needed. (Patient taking differently: Take 50 mg by mouth every 6 (six) hours as needed for moderate pain. ) 30 tablet 0   No current facility-administered medications for this visit.     OBJECTIVE: Vitals:   09/11/16 0957  BP: 106/72  Pulse: (!) 111  Resp: 18  Temp: (!) 96.4 F (35.8 C)     Body mass index is 12.5 kg/m.    ECOG FS:1 - Symptomatic but completely ambulatory  General: Thin, no acute distress. Eyes: Pink conjunctiva, anicteric sclera. Lungs: Clear to auscultation bilaterally. Heart: Regular rate and rhythm. No rubs, murmurs, or gallops. Abdomen: Mildly distended, nontender.Marland Kitchen  Musculoskeletal: No edema, cyanosis, or clubbing. Neuro: Alert, answering all questions appropriately. Cranial nerves grossly intact. Skin: No rashes or petechiae noted. Psych: Normal affect.   LAB RESULTS:  Lab Results  Component Value Date   NA 133 (L) 07/26/2016   K 4.2 07/26/2016   CL 101 07/26/2016   CO2 25 07/26/2016   GLUCOSE 89 07/26/2016   BUN 15 07/26/2016   CREATININE 0.90 09/08/2016   CALCIUM 8.7 (L) 07/26/2016   PROT 6.5 07/26/2016   ALBUMIN 3.5 07/26/2016   AST 20 07/26/2016   ALT 9 (L) 07/26/2016   ALKPHOS 88 07/26/2016   BILITOT 0.5 07/26/2016   GFRNONAA >60 07/26/2016   GFRAA >60 07/26/2016    Lab Results  Component Value Date   WBC 10.3 07/26/2016   NEUTROABS 9.0 (H) 07/26/2016   HGB 11.5 (L) 07/26/2016   HCT 34.6 (L) 07/26/2016   MCV 101.5 (H) 07/26/2016   PLT 230 07/26/2016     STUDIES: Ct Chest W Contrast  Result Date: 09/08/2016 CLINICAL DATA:  76 year old female an with history of stage IV small cell carcinoma status post chemotherapy 2 months ago. Followup study. EXAM: CT CHEST WITH CONTRAST TECHNIQUE:  Multidetector CT imaging of the chest was performed during intravenous contrast administration. CONTRAST:  95m ISOVUE-300 IOPAMIDOL (ISOVUE-300) INJECTION 61% COMPARISON:  Multiple priors, most recently PET-CT 07/17/2016. FINDINGS: Cardiovascular: Heart size is normal. There is no significant pericardial fluid, thickening or pericardial calcification. There is aortic atherosclerosis, as well as atherosclerosis of the great vessels of the mediastinum and the coronary arteries, including calcified atherosclerotic plaque in the left main, left anterior descending, left circumflex and right coronary arteries. Thickening calcification of the aortic valve and mitral valve. 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 Mediastinum/Nodes: No pathologically enlarged mediastinal or hilar lymph nodes. Esophagus is unremarkable in appearance. No axillary lymphadenopathy. Lungs/Pleura: Architectural distortion in the left lower lobe at the site of the treated neoplasm, overall similar in appearance to prior examination from 07/17/2016. No focal soft tissue mass to suggest local recurrence of disease on today's examination. 3 mm subpleural nodule in the periphery of the lateral segment of the right middle lobe (image 106 of series 3) is stable compared to prior studies, presumably a subpleural lymph node. No new pulmonary nodules or masses are noted elsewhere in the lungs. No acute consolidative airspace disease. No pleural effusions. Upper Abdomen: Aortic atherosclerosis. Musculoskeletal: Old compression fractures of T6 and T10 are again noted, with post vertebroplasty changes at T10. 30% loss of anterior vertebral body height at T6. There are new vertebroplasty changes noted at T11, T12, L1 and L2, with new compression fractures identified at T12, L1 and L2, most severe at T12 where there is approximately 70% loss of anterior vertebral body height. There are no aggressive appearing lytic or blastic lesions noted in  the visualized portions of the skeleton. IMPRESSION: 1. Today's study again demonstrates scarring in the left lower lobe at the site of the treated neoplasm, without definite evidence to suggest local recurrence of disease. No adenopathy or no other signs of metastatic disease in the thorax on today's examination. 2. Aortic atherosclerosis, in addition to left main and 3 vessel coronary artery disease. Assessment for potential risk factor modification, dietary therapy or pharmacologic therapy may be warranted, if clinically indicated. 3. 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 Attention at time of routine followup examinations is recommended to ensure continued stability. 4. There are calcifications of the  aortic valve and mitral valve. Echocardiographic correlation for evaluation of potential valvular dysfunction may be warranted if clinically indicated. 5. Additional incidental findings, as above. Electronically Signed   By: Vinnie Langton M.D.   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 Because patient was noted to have disease that was not seen on PET scan or CT scan at her initial diagnosis, have referred patient back to pulmonology for further evaluation. Patient will likely require additional chemotherapy in the future. Return to clinic in 3 months with repeat imaging and further evaluation. If she has progression of disease, would use carboplatinum and etoposide as previous.  2. Headaches: MRI negative for metastasis, monitor. 3. Pain: Continue treatment evaluation per orthopedics.  4. Weight loss: Likely multifactorial including possible underlying malignancy. Patient  states she has a good appetite. 5. Thrombocytopenia: Resolved. Secondary chemotherapy, monitor.  6. Leukocytosis: Resolved.   Patient expressed understanding and was in agreement with this plan. She also understands that She can call clinic at any time with any questions, concerns, or complaints.   Cancer Staging Primary cancer of left lung metastatic to other site [facility]) Staging form: Lung, AJCC 7th Edition - Clinical stage from 03/12/2016: Stage IV (T3, N3, M1a) - Signed by Lloyd Huger, MD on 03/26/2016   Lloyd Huger, MD   09/11/2016 10:39 AM

## 2016-09-11 ENCOUNTER — Inpatient Hospital Stay: Payer: Medicare HMO | Attending: Oncology | Admitting: Oncology

## 2016-09-11 VITALS — BP 106/72 | HR 111 | Temp 96.4°F | Resp 18 | Wt 70.5 lb

## 2016-09-11 DIAGNOSIS — C7801 Secondary malignant neoplasm of right lung: Secondary | ICD-10-CM | POA: Diagnosis not present

## 2016-09-11 DIAGNOSIS — E039 Hypothyroidism, unspecified: Secondary | ICD-10-CM | POA: Diagnosis not present

## 2016-09-11 DIAGNOSIS — R634 Abnormal weight loss: Secondary | ICD-10-CM | POA: Insufficient documentation

## 2016-09-11 DIAGNOSIS — I700 Atherosclerosis of aorta: Secondary | ICD-10-CM | POA: Insufficient documentation

## 2016-09-11 DIAGNOSIS — Z8701 Personal history of pneumonia (recurrent): Secondary | ICD-10-CM | POA: Diagnosis not present

## 2016-09-11 DIAGNOSIS — R51 Headache: Secondary | ICD-10-CM | POA: Diagnosis not present

## 2016-09-11 DIAGNOSIS — R0602 Shortness of breath: Secondary | ICD-10-CM | POA: Diagnosis not present

## 2016-09-11 DIAGNOSIS — M818 Other osteoporosis without current pathological fracture: Secondary | ICD-10-CM | POA: Insufficient documentation

## 2016-09-11 DIAGNOSIS — R5383 Other fatigue: Secondary | ICD-10-CM | POA: Diagnosis not present

## 2016-09-11 DIAGNOSIS — F419 Anxiety disorder, unspecified: Secondary | ICD-10-CM | POA: Diagnosis not present

## 2016-09-11 DIAGNOSIS — J449 Chronic obstructive pulmonary disease, unspecified: Secondary | ICD-10-CM | POA: Insufficient documentation

## 2016-09-11 DIAGNOSIS — E785 Hyperlipidemia, unspecified: Secondary | ICD-10-CM

## 2016-09-11 DIAGNOSIS — R32 Unspecified urinary incontinence: Secondary | ICD-10-CM | POA: Diagnosis not present

## 2016-09-11 DIAGNOSIS — I129 Hypertensive chronic kidney disease with stage 1 through stage 4 chronic kidney disease, or unspecified chronic kidney disease: Secondary | ICD-10-CM | POA: Diagnosis not present

## 2016-09-11 DIAGNOSIS — M549 Dorsalgia, unspecified: Secondary | ICD-10-CM | POA: Diagnosis not present

## 2016-09-11 DIAGNOSIS — C3492 Malignant neoplasm of unspecified part of left bronchus or lung: Secondary | ICD-10-CM

## 2016-09-11 DIAGNOSIS — F329 Major depressive disorder, single episode, unspecified: Secondary | ICD-10-CM | POA: Insufficient documentation

## 2016-09-11 DIAGNOSIS — Z87891 Personal history of nicotine dependence: Secondary | ICD-10-CM | POA: Insufficient documentation

## 2016-09-11 DIAGNOSIS — Z8719 Personal history of other diseases of the digestive system: Secondary | ICD-10-CM | POA: Diagnosis not present

## 2016-09-11 DIAGNOSIS — M5030 Other cervical disc degeneration, unspecified cervical region: Secondary | ICD-10-CM | POA: Diagnosis not present

## 2016-09-11 DIAGNOSIS — R531 Weakness: Secondary | ICD-10-CM

## 2016-09-11 DIAGNOSIS — C3402 Malignant neoplasm of left main bronchus: Secondary | ICD-10-CM | POA: Insufficient documentation

## 2016-09-11 DIAGNOSIS — E559 Vitamin D deficiency, unspecified: Secondary | ICD-10-CM | POA: Insufficient documentation

## 2016-09-11 DIAGNOSIS — N189 Chronic kidney disease, unspecified: Secondary | ICD-10-CM | POA: Insufficient documentation

## 2016-09-11 DIAGNOSIS — Z8673 Personal history of transient ischemic attack (TIA), and cerebral infarction without residual deficits: Secondary | ICD-10-CM

## 2016-09-11 NOTE — Progress Notes (Signed)
States is feeling weak today. Had back pain yesterday but has resolved today.

## 2016-09-12 ENCOUNTER — Telehealth: Payer: Self-pay | Admitting: Family Medicine

## 2016-09-12 NOTE — Telephone Encounter (Signed)
Patient needs her Lorazepam sent to [facility]  Thanks

## 2016-09-13 MED ORDER — LORAZEPAM 1 MG PO TABS: 1.0000 mg | ORAL_TABLET | Freq: Two times a day (BID) | ORAL | 2 refills | Status: DC | PRN

## 2016-09-13 NOTE — Telephone Encounter (Signed)
Routing to provider  

## 2016-09-13 NOTE — Telephone Encounter (Signed)
Patient notified that medication was called in for her.

## 2016-09-13 NOTE — Telephone Encounter (Signed)
Lorazepam RX called into pharmacy for the patient. Will call and let her know.

## 2016-09-13 NOTE — Telephone Encounter (Signed)
OK to call in lorazepam

## 2016-09-21 ENCOUNTER — Inpatient Hospital Stay: Payer: Medicare HMO

## 2016-09-21 DIAGNOSIS — R51 Headache: Secondary | ICD-10-CM | POA: Diagnosis not present

## 2016-09-21 DIAGNOSIS — F419 Anxiety disorder, unspecified: Secondary | ICD-10-CM | POA: Diagnosis not present

## 2016-09-21 DIAGNOSIS — R634 Abnormal weight loss: Secondary | ICD-10-CM | POA: Diagnosis not present

## 2016-09-21 DIAGNOSIS — M549 Dorsalgia, unspecified: Secondary | ICD-10-CM | POA: Diagnosis not present

## 2016-09-21 DIAGNOSIS — R0602 Shortness of breath: Secondary | ICD-10-CM | POA: Diagnosis not present

## 2016-09-21 DIAGNOSIS — C7801 Secondary malignant neoplasm of right lung: Secondary | ICD-10-CM | POA: Diagnosis not present

## 2016-09-21 DIAGNOSIS — Z95828 Presence of other vascular implants and grafts: Secondary | ICD-10-CM

## 2016-09-21 DIAGNOSIS — C3402 Malignant neoplasm of left main bronchus: Secondary | ICD-10-CM | POA: Diagnosis not present

## 2016-09-21 DIAGNOSIS — R5383 Other fatigue: Secondary | ICD-10-CM | POA: Diagnosis not present

## 2016-09-21 DIAGNOSIS — R531 Weakness: Secondary | ICD-10-CM | POA: Diagnosis not present

## 2016-09-21 MED ORDER — HEPARIN SOD (PORK) LOCK FLUSH 100 UNIT/ML IV SOLN
500.0000 [IU] | Freq: Once | INTRAVENOUS | Status: AC
  Administered 2016-09-21: 500 [IU] via INTRAVENOUS
  Filled 2016-09-21: qty 5

## 2016-09-21 MED ORDER — SODIUM CHLORIDE 0.9% FLUSH
10.0000 mL | INTRAVENOUS | Status: DC | PRN
  Administered 2016-09-21: 10 mL via INTRAVENOUS
  Filled 2016-09-21: qty 10

## 2016-09-27 ENCOUNTER — Encounter (INDEPENDENT_AMBULATORY_CARE_PROVIDER_SITE_OTHER): Payer: Self-pay

## 2016-10-04 DIAGNOSIS — Z9889 Other specified postprocedural states: Secondary | ICD-10-CM | POA: Diagnosis not present

## 2016-10-04 DIAGNOSIS — M549 Dorsalgia, unspecified: Secondary | ICD-10-CM | POA: Diagnosis not present

## 2016-10-05 ENCOUNTER — Other Ambulatory Visit: Payer: Self-pay | Admitting: Orthopedic Surgery

## 2016-10-05 DIAGNOSIS — Z9889 Other specified postprocedural states: Secondary | ICD-10-CM

## 2016-10-05 DIAGNOSIS — M549 Dorsalgia, unspecified: Secondary | ICD-10-CM

## 2016-10-09 ENCOUNTER — Other Ambulatory Visit (INDEPENDENT_AMBULATORY_CARE_PROVIDER_SITE_OTHER): Payer: Self-pay | Admitting: Vascular Surgery

## 2016-10-09 DIAGNOSIS — I739 Peripheral vascular disease, unspecified: Secondary | ICD-10-CM

## 2016-10-09 DIAGNOSIS — I700 Atherosclerosis of aorta: Secondary | ICD-10-CM

## 2016-10-09 DIAGNOSIS — I701 Atherosclerosis of renal artery: Secondary | ICD-10-CM

## 2016-10-10 ENCOUNTER — Ambulatory Visit (INDEPENDENT_AMBULATORY_CARE_PROVIDER_SITE_OTHER): Payer: Medicare HMO | Admitting: Vascular Surgery

## 2016-10-10 ENCOUNTER — Ambulatory Visit (INDEPENDENT_AMBULATORY_CARE_PROVIDER_SITE_OTHER): Payer: Medicare HMO

## 2016-10-10 ENCOUNTER — Encounter (INDEPENDENT_AMBULATORY_CARE_PROVIDER_SITE_OTHER): Payer: Self-pay | Admitting: Vascular Surgery

## 2016-10-10 VITALS — BP 123/77 | HR 92 | Resp 17 | Wt 73.0 lb

## 2016-10-10 DIAGNOSIS — I701 Atherosclerosis of renal artery: Secondary | ICD-10-CM | POA: Diagnosis not present

## 2016-10-10 DIAGNOSIS — I129 Hypertensive chronic kidney disease with stage 1 through stage 4 chronic kidney disease, or unspecified chronic kidney disease: Secondary | ICD-10-CM

## 2016-10-10 DIAGNOSIS — E785 Hyperlipidemia, unspecified: Secondary | ICD-10-CM | POA: Diagnosis not present

## 2016-10-10 DIAGNOSIS — I700 Atherosclerosis of aorta: Secondary | ICD-10-CM

## 2016-10-10 DIAGNOSIS — N184 Chronic kidney disease, stage 4 (severe): Secondary | ICD-10-CM | POA: Diagnosis not present

## 2016-10-10 DIAGNOSIS — R1084 Generalized abdominal pain: Secondary | ICD-10-CM

## 2016-10-10 DIAGNOSIS — I739 Peripheral vascular disease, unspecified: Secondary | ICD-10-CM | POA: Diagnosis not present

## 2016-10-10 NOTE — Assessment & Plan Note (Signed)
The patient has had a previous aortobiiliac bypass. Her perfusion is currently intact. I will plan to recheck this in one year.

## 2016-10-10 NOTE — Assessment & Plan Note (Signed)
blood pressure control important in reducing the progression of atherosclerotic disease. On appropriate oral medications.  

## 2016-10-10 NOTE — Assessment & Plan Note (Signed)
I do not have an obvious, clear cause of this. She does not have significant mesenteric artery stenosis by duplex I do not think this is chronic visceral ischemia. We can check and aortoiliac duplex given her previous history of aortobiiliac bypass, but I do not think this would explain her abdominal pain. She may have some degree of hernia formation although that is not clear on exam.

## 2016-10-10 NOTE — Progress Notes (Signed)
MRN : [id number]  Melody Green is a 76 y.o. (01/19/1941) female who presents with chief complaint of  Chief Complaint  Patient presents with  . Follow-up  .  History of Present Illness: Patient returns today in follow up of Multiple vascular issues. She is being treated for malignancy and has had a very difficult time. She has had a large number of vascular studies including a mesenteric duplex which demonstrated no hemodynamically significant celiac, SMA, or IMA stenosis. Her renal artery duplex demonstrated normal renal artery velocities bilaterally without hemodynamically significant stenosis. Her ABIs were normal with normal waveforms bilaterally. She has a lot of lower abdominal pain which is chronic. She says it is worse recently. She has stopped smoking since her last visit.  Current Outpatient Prescriptions  Medication Sig Dispense Refill  . acetaminophen (TYLENOL) 500 MG tablet Take 1,000 mg by mouth every 6 (six) hours as needed for moderate pain or headache.    . albuterol (PROVENTIL HFA) 108 (90 Base) MCG/ACT inhaler Inhale 2 puffs into the lungs every 4 (four) hours as needed for wheezing or shortness of breath. 1 Inhaler 0  . amLODipine (NORVASC) 2.5 MG tablet Take 2.5 mg by mouth daily.    Marland Kitchen atorvastatin (LIPITOR) 20 MG tablet Take 20 mg by mouth at bedtime.    . bisacodyl (DULCOLAX) 5 MG EC tablet Take 5 mg by mouth daily as needed for moderate constipation.    . calcium carbonate (OS-CAL) 600 MG TABS tablet Take 600 mg by mouth 2 (two) times daily.     . clopidogrel (PLAVIX) 75 MG tablet Take 75 mg by mouth at bedtime.    . cyanocobalamin (,VITAMIN B-12,) 1000 MCG/ML injection Inject 1,000 mcg into the muscle every 30 (thirty) days.    . cyclobenzaprine (FLEXERIL) 10 MG tablet Take 1 tablet (10 mg total) by mouth 2 (two) times daily with a meal. (Patient taking differently: Take 10 mg by mouth every 8 (eight) hours as needed for muscle spasms. ) 30 tablet 0  . levothyroxine  (SYNTHROID, LEVOTHROID) 75 MCG tablet TAKE 1 TABLET EVERY DAY BEFORE BREAKFAST 90 tablet 1  . lidocaine-prilocaine (EMLA) cream Apply to port site 1 hour before port is accessed each time (Patient taking differently: Apply 1 application topically daily as needed (applied to port 1 hours before accessed). Apply to port site 1 hour before port is accessed each time) 30 g 3  . LORazepam (ATIVAN) 1 MG tablet Take 1 tablet (1 mg total) by mouth 2 (two) times daily as needed for anxiety. 30 tablet 2  . mometasone (NASONEX) 50 MCG/ACT nasal spray Place 2 sprays into the nose daily as needed (for rhinitis).     Marland Kitchen omeprazole (PRILOSEC) 20 MG capsule TAKE 1 CAPSULE (20 MG TOTAL) BY MOUTH DAILY. (Patient taking differently: TAKE 1 CAPSULE (20 MG TOTAL) BY MOUTH DAILY AS NEEDED FOR ACID REFLUX) 30 capsule 3  . ondansetron (ZOFRAN) 8 MG tablet Take 1 tablet (8 mg total) by mouth 2 (two) times daily as needed for refractory nausea / vomiting. 30 tablet 2  . prochlorperazine (COMPAZINE) 10 MG tablet Take 1 tablet (10 mg total) by mouth every 6 (six) hours as needed (Nausea or vomiting). 30 tablet 1  . traMADol (ULTRAM) 50 MG tablet Take 1 tablet (50 mg total) by mouth every 6 (six) hours as needed. (Patient taking differently: Take 50 mg by mouth every 6 (six) hours as needed for moderate pain. ) 30 tablet 0  No current facility-administered medications for this visit.     Past Medical History:  Diagnosis Date  . 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 Anxiety   . Cancer ([place]) 03/10/2016   lung  . Chronic kidney disease   . Collagenous colitis   . COPD (chronic obstructive pulmonary disease) ([place])   . DDD (degenerative disc disease), cervical   . Depression   . Hernia of abdominal cavity   . Hyperlipidemia   . Hypertension   . Hypothyroidism   . Osteoporosis   . Pneumonia   . Stroke ([place])   . Tobacco abuse   . Urine incontinence   . Vitamin B 12 deficiency     Past Surgical History:  Procedure Laterality  Date  . ABDOMINAL AORTIC ANEURYSM REPAIR    . ABDOMINAL HYSTERECTOMY    . APPENDECTOMY    . BACK SURGERY  [phone]  . CHOLECYSTECTOMY    . FLEXIBLE BRONCHOSCOPY N/A 03/17/2016   Procedure: FLEXIBLE BRONCHOSCOPY;  Surgeon: Wilhelmina Mcardle, MD;  Location: [facility];  Service: Pulmonary;  Laterality: N/A;  . KYPHOPLASTY N/A 07/13/2016   Procedure: KYPHOPLASTY;  Surgeon: Hessie Knows, MD;  Location: [facility];  Service: Orthopedics;  Laterality: N/A;  . KYPHOPLASTY N/A 08/03/2016   Procedure: KYPHOPLASTY T12, L2;  Surgeon: Hessie Knows, MD;  Location: [facility];  Service: Orthopedics;  Laterality: N/A;  . PERIPHERAL VASCULAR CATHETERIZATION N/A 04/03/2016   Procedure: Glori Luis Cath Insertion;  Surgeon: Algernon Huxley, MD;  Location: [facility];  Service: Cardiovascular;  Laterality: N/A;    Social History Social History  Substance Use Topics  . Smoking status: Former Smoker    Packs/day: 0.25    Types: Cigarettes  . Smokeless tobacco: Never Used     Comment: quit smoking 03/04/16  . Alcohol use No  No IV drug use  Family History Family History  Problem Relation Age of Onset  . Diabetes Mother   . Diabetes Father   No bleeding or clotting disorders  Allergies  Allergen Reactions  . Hydrocodone Itching and Rash  . Codeine Rash  . Oxycodone Itching and Rash     REVIEW OF SYSTEMS (Negative unless checked)  Constitutional: [x] Weight loss  [] Fever  [] Chills Cardiac: [] Chest pain   [] Chest pressure   [] Palpitations   [] Shortness of breath when laying flat   [] Shortness of breath at rest   [x] Shortness of breath with exertion. Vascular:  [x] Pain in legs with walking   [] Pain in legs at rest   [] Pain in legs when laying flat   [] Claudication   [] Pain in feet when walking  [] Pain in feet at rest  [] Pain in feet when laying flat   [] History of DVT   [] Phlebitis   [] Swelling in legs   [] Varicose veins   [] Non-healing ulcers Pulmonary:   [] Uses home oxygen   [x] Productive cough    [] Hemoptysis   [] Wheeze  [x] COPD   [] Asthma Neurologic:  [] Dizziness  [] Blackouts   [] Seizures   [] History of stroke   [] History of TIA  [] Aphasia   [] Temporary blindness   [] Dysphagia   [] Weakness or numbness in arms   [] Weakness or numbness in legs Musculoskeletal:  [] Arthritis   [] Joint swelling   [] Joint pain   [] Low back pain Hematologic:  [] Easy bruising  [] Easy bleeding   [] Hypercoagulable state   [] Anemic   Gastrointestinal:  [] Blood in stool   [] Vomiting blood  [] Gastroesophageal reflux/heartburn   [x] Abdominal pain Genitourinary:  [] Chronic kidney disease   [] Difficult urination  []   Frequent urination  [] Burning with urination   [] Hematuria Skin:  [] Rashes   [] Ulcers   [] Wounds Psychological:  [] History of anxiety   []  History of major depression.  Physical Examination  BP 123/77   Pulse 92   Resp 17   Wt 73 lb (33.1 kg)   BMI 12.93 kg/m  Gen: Frail, cachectic white female in no apparent distress Head: [place]/AT, + temporalis wasting. Ear/Nose/Throat: Hearing grossly intact, nares w/o erythema or drainage, trachea midline Eyes: Conjunctiva clear. Sclera non-icteric Neck: Supple.  No JVD.  Pulmonary:  Good air movement, no use of accessory muscles.  Cardiac: RRR, normal S1, S2 Vascular:  Vessel Right Left  Radial Palpable Palpable  Ulnar Palpable Palpable  Brachial Palpable Palpable  Carotid Palpable, without bruit Palpable, without bruit  Aorta Not palpable N/A  Femoral Palpable Palpable  Popliteal Palpable Palpable  PT Palpable Palpable  DP Palpable Palpable   Gastrointestinal: soft, Mildly tender in the lower abdomen  Musculoskeletal: M/S 5/5 throughout.  No deformity or atrophy. No edema. Neurologic: Sensation grossly intact in extremities.  Symmetrical.  Speech is fluent.  Psychiatric: Judgment intact, anxious affect Dermatologic: No rashes or ulcers noted.  No cellulitis or open wounds. Lymph : No Cervical, Axillary, or Inguinal  lymphadenopathy.      Labs Recent Results (from the past 2160 hour(s))  Glucose, capillary     Status: None   Collection Time: 07/17/16 10:21 AM  Result Value Ref Range   Glucose-Capillary 82 65 - 99 mg/dL  CBC with Differential     Status: Abnormal   Collection Time: 07/26/16  9:44 AM  Result Value Ref Range   WBC 10.3 3.6 - 11.0 K/uL   RBC 3.41 (L) 3.80 - 5.20 MIL/uL   Hemoglobin 11.5 (L) 12.0 - 16.0 g/dL   HCT 34.6 (L) 35.0 - 47.0 %   MCV 101.5 (H) 80.0 - 100.0 fL   MCH 33.8 26.0 - 34.0 pg   MCHC 33.3 32.0 - 36.0 g/dL   RDW 15.9 (H) 11.5 - 14.5 %   Platelets 230 150 - 440 K/uL   Neutrophils Relative % 87 %   Neutro Abs 9.0 (H) 1.4 - 6.5 K/uL   Lymphocytes Relative 7 %   Lymphs Abs 0.8 (L) 1.0 - 3.6 K/uL   Monocytes Relative 4 %   Monocytes Absolute 0.4 0.2 - 0.9 K/uL   Eosinophils Relative 1 %   Eosinophils Absolute 0.1 0 - 0.7 K/uL   Basophils Relative 1 %   Basophils Absolute 0.1 0 - 0.1 K/uL  Comprehensive metabolic panel     Status: Abnormal   Collection Time: 07/26/16  9:44 AM  Result Value Ref Range   Sodium 133 (L) 135 - 145 mmol/L   Potassium 4.2 3.5 - 5.1 mmol/L   Chloride 101 101 - 111 mmol/L   CO2 25 22 - 32 mmol/L   Glucose, Bld 89 65 - 99 mg/dL   BUN 15 6 - 20 mg/dL   Creatinine, Ser 0.84 0.44 - 1.00 mg/dL   Calcium 8.7 (L) 8.9 - 10.3 mg/dL   Total Protein 6.5 6.5 - 8.1 g/dL   Albumin 3.5 3.5 - 5.0 g/dL   AST 20 15 - 41 U/L   ALT 9 (L) 14 - 54 U/L   Alkaline Phosphatase 88 38 - 126 U/L   Total Bilirubin 0.5 0.3 - 1.2 mg/dL   GFR calc non Af Amer >60 >60 mL/min   GFR calc Af Amer >60 >60 mL/min  Comment: (NOTE) The eGFR has been calculated using the CKD EPI equation. This calculation has not been validated in all clinical situations. eGFR's persistently <60 mL/min signify possible Chronic Kidney Disease.    Anion gap 7 5 - 15  Surgical pathology     Status: None   Collection Time: 08/03/16 12:53 PM  Result Value Ref Range   SURGICAL  PATHOLOGY      Surgical Pathology CASE: [id number] PATIENT: Verley Leamer Surgical Pathology Report     SPECIMEN SUBMITTED: A. T12 bone; biopsy  CLINICAL HISTORY: None provided  PRE-OPERATIVE DIAGNOSIS: Compression fracture  POST-OPERATIVE DIAGNOSIS: Same as pre-op     DIAGNOSIS: A. T12 BONE; BIOPSY: - SCANT BENIGN BONE. - MARROW WITH HEMORRHAGE AND TRILINEAGE HEMATOPOIESIS.   GROSS DESCRIPTION:  A. Labeled: T12 bone biopsy  Tissue fragment(s): multiple  Size: aggregate, 1.6 x 1.1 x 0.1 cm  Description: blood clot and bony fragments  Entirely submitted in one cassette(s) following decalcification.    Final Diagnosis performed by Delorse Lek, MD.  Electronically signed 08/04/2016 9:45:01AM    The electronic signature indicates that the named Attending Pathologist has evaluated the specimen  Technical component performed at [facility], [address] [place], [place] [address] Lab: [id number] Dir: Darrick Penna. Evette Doffing, MD  Professional component performed at  [facility], [address] [facility], [place], [place], [place] [address] Lab: [id number] Dir: Dellia Nims. Rubinas, MD    I-STAT creatinine     Status: None   Collection Time: 09/08/16  9:21 AM  Result Value Ref Range   Creatinine, Ser 0.90 0.44 - 1.00 mg/dL    Radiology No results found.   Assessment/Plan  Hyperlipidemia lipid control important in reducing the progression of atherosclerotic disease. Continue statin therapy   Abdominal pain I do not have an obvious, clear cause of this. She does not have significant mesenteric artery stenosis by duplex I do not think this is chronic visceral ischemia. We can check and aortoiliac duplex given her previous history of aortobiiliac bypass, but I do not think this would explain her abdominal pain. She may have some degree of hernia formation although that is not clear on exam.  Benign hypertension with CKD (chronic kidney disease) stage  IV (HCC) blood pressure control important in reducing the progression of atherosclerotic disease. On appropriate oral medications.   PVD (peripheral vascular disease) ([place]) The patient has had a previous aortobiiliac bypass. Her perfusion is currently intact. I will plan to recheck this in one year.    Leotis Pain, MD  10/10/2016 5:14 PM    This note was created with Dragon medical transcription system.  Any errors from dictation are purely unintentional

## 2016-10-10 NOTE — Assessment & Plan Note (Signed)
lipid control important in reducing the progression of atherosclerotic disease. Continue statin therapy  

## 2016-10-12 ENCOUNTER — Encounter
Admission: RE | Admit: 2016-10-12 | Discharge: 2016-10-12 | Disposition: A | Discharge status: Home / Self Care | Payer: Medicare HMO | Source: Ambulatory Visit | Attending: Orthopedic Surgery | Admitting: Orthopedic Surgery

## 2016-10-12 DIAGNOSIS — M549 Dorsalgia, unspecified: Secondary | ICD-10-CM | POA: Diagnosis not present

## 2016-10-12 DIAGNOSIS — Z9889 Other specified postprocedural states: Secondary | ICD-10-CM

## 2016-10-12 MED ORDER — TECHNETIUM TC 99M MEDRONATE IV KIT
21.7510 | PACK | Freq: Once | INTRAVENOUS | Status: AC | PRN
  Administered 2016-10-12: 21.751 via INTRAVENOUS

## 2016-10-24 ENCOUNTER — Encounter (INDEPENDENT_AMBULATORY_CARE_PROVIDER_SITE_OTHER): Payer: Medicare HMO

## 2016-10-24 ENCOUNTER — Ambulatory Visit (INDEPENDENT_AMBULATORY_CARE_PROVIDER_SITE_OTHER): Payer: Medicare HMO | Admitting: Vascular Surgery

## 2016-10-24 ENCOUNTER — Telehealth: Payer: Self-pay | Admitting: Family Medicine

## 2016-10-24 ENCOUNTER — Other Ambulatory Visit: Payer: Self-pay | Admitting: Family Medicine

## 2016-10-24 DIAGNOSIS — C3492 Malignant neoplasm of unspecified part of left bronchus or lung: Secondary | ICD-10-CM

## 2016-10-24 MED ORDER — TRAMADOL HCL 50 MG PO TABS: 50.0000 mg | ORAL_TABLET | Freq: Four times a day (QID) | ORAL | 2 refills | Status: AC | PRN

## 2016-10-24 NOTE — Telephone Encounter (Signed)
Unable to call it in, Rx up front for her to pick up.

## 2016-10-24 NOTE — Telephone Encounter (Signed)
Patient would like Dr Wynetta Emery to call her in some Tramadol '50mg'$  tp [facility]  Thank you

## 2016-10-24 NOTE — Telephone Encounter (Signed)
Patient notified

## 2016-10-25 ENCOUNTER — Telehealth: Payer: Self-pay | Admitting: Family Medicine

## 2016-10-25 NOTE — Telephone Encounter (Signed)
Just FYI----Patient requested tramadol '50mg'$ .  Dr Wynetta Emery gave patient script   [facility]   Thanks

## 2016-10-31 ENCOUNTER — Ambulatory Visit (INDEPENDENT_AMBULATORY_CARE_PROVIDER_SITE_OTHER): Payer: Commercial Managed Care - HMO | Admitting: Family Medicine

## 2016-10-31 ENCOUNTER — Encounter: Payer: Self-pay | Admitting: Family Medicine

## 2016-10-31 ENCOUNTER — Telehealth: Payer: Self-pay | Admitting: Family Medicine

## 2016-10-31 VITALS — BP 132/90 | HR 97 | Temp 97.9°F | Wt 72.6 lb

## 2016-10-31 DIAGNOSIS — N184 Chronic kidney disease, stage 4 (severe): Secondary | ICD-10-CM | POA: Diagnosis not present

## 2016-10-31 DIAGNOSIS — M546 Pain in thoracic spine: Secondary | ICD-10-CM | POA: Diagnosis not present

## 2016-10-31 DIAGNOSIS — E039 Hypothyroidism, unspecified: Secondary | ICD-10-CM | POA: Diagnosis not present

## 2016-10-31 DIAGNOSIS — I129 Hypertensive chronic kidney disease with stage 1 through stage 4 chronic kidney disease, or unspecified chronic kidney disease: Secondary | ICD-10-CM

## 2016-10-31 DIAGNOSIS — R410 Disorientation, unspecified: Secondary | ICD-10-CM | POA: Diagnosis not present

## 2016-10-31 DIAGNOSIS — Z720 Tobacco use: Secondary | ICD-10-CM | POA: Diagnosis not present

## 2016-10-31 DIAGNOSIS — R627 Adult failure to thrive: Secondary | ICD-10-CM | POA: Diagnosis not present

## 2016-10-31 DIAGNOSIS — E785 Hyperlipidemia, unspecified: Secondary | ICD-10-CM | POA: Diagnosis not present

## 2016-10-31 DIAGNOSIS — G8929 Other chronic pain: Secondary | ICD-10-CM | POA: Diagnosis not present

## 2016-10-31 DIAGNOSIS — E43 Unspecified severe protein-calorie malnutrition: Secondary | ICD-10-CM

## 2016-10-31 MED ORDER — MIRTAZAPINE 15 MG PO TBDP: 15.0000 mg | ORAL_TABLET | Freq: Every day | ORAL | 1 refills | Status: DC

## 2016-10-31 MED ORDER — AMLODIPINE BESYLATE 2.5 MG PO TABS: 2.5000 mg | ORAL_TABLET | Freq: Every day | ORAL | 1 refills | Status: AC

## 2016-10-31 MED ORDER — OMEPRAZOLE 20 MG PO CPDR: DELAYED_RELEASE_CAPSULE | ORAL | 1 refills | Status: DC

## 2016-10-31 MED ORDER — ATORVASTATIN CALCIUM 20 MG PO TABS: 20.0000 mg | ORAL_TABLET | Freq: Every day | ORAL | 1 refills | Status: AC

## 2016-10-31 NOTE — Assessment & Plan Note (Signed)
Will start remeron. Rx sent to her pharmacy. Will check back in 1 month. Call with any concerns. Will also get home health in. Referral generated today.

## 2016-10-31 NOTE — Progress Notes (Signed)
BP 132/90 (BP Location: Left Arm, Cuff Size: Small)   Pulse 97   Temp 97.9 F (36.6 C)   Wt 72 lb 9.6 oz (32.9 kg)   SpO2 98%   BMI 12.86 kg/m    Subjective:    Patient ID: Melody Green, female    DOB: [birth year], 76 y.o.   MRN: [id number]  HPI: Melody Green is a 76 y.o. female  Chief Complaint  Patient presents with  . Back Pain    Patients spine is protruding from her back, she states that it is so painful that she can not lay on her back at all  . Nicotine Dependence    Patient would like a new prescription for Wellbutrin   BACK PAIN- fell off her couch, unclear when and has been having some pain in her back since then. Unclear if she was seen during that time. She notes that her back hurts all the time, can't lay on it at all. Tramadol works, but constipates her and only lasts a little bit.  Duration: chronic Mechanism of injury: unknown Location: entire back Onset: gradual Severity: severe Quality: aching, stabbing and throbbing Frequency: constant Radiation: none Aggravating factors: laying on it Alleviating factors: tramadol Status: worse Treatments attempted: none, rest, ice, heat, APAP, ibuprofen and aleve  Relief with NSAIDs?: mild Nighttime pain:  no Paresthesias / decreased sensation:  no Bowel / bladder incontinence:  no Fevers:  no Dysuria / urinary frequency:  no  SMOKING CESSATION Smoking Status: current everyday smoker Smoking Amount: 1/4 ppd Smoking Quit Date: Not set Type of tobacco use: cigarettes Children in the house: no Other household members who smoke: yes Treatments attempted: Wellbutrin Pneumovax: up to date  Has been getting up in the middle of the night and getting confused. Cannot leave her room, stumbling into things. Doesn't know if she's asleep or nor when this happens.   Relevant past medical, surgical, family and social history reviewed and updated as indicated. Interim medical history since our last visit reviewed. Allergies  and medications reviewed and updated.  Review of Systems  Constitutional: Positive for appetite change and fatigue. Negative for activity change, chills, diaphoresis, fever and unexpected weight change.  HENT: Negative.   Respiratory: Negative.   Cardiovascular: Negative.   Gastrointestinal: Positive for abdominal distention. Negative for abdominal pain, anal bleeding, blood in stool, constipation, diarrhea, nausea, rectal pain and vomiting.  Endocrine: Negative.   Genitourinary: Negative.   Musculoskeletal: Positive for arthralgias, back pain, gait problem, myalgias, neck pain and neck stiffness. Negative for joint swelling.  Neurological: Positive for dizziness, weakness and light-headedness. Negative for tremors, seizures, syncope, facial asymmetry, speech difficulty, numbness and headaches.  Psychiatric/Behavioral: Positive for behavioral problems, confusion and sleep disturbance. Negative for agitation, decreased concentration, dysphoric mood, hallucinations, self-injury and suicidal ideas. The patient is not nervous/anxious and is not hyperactive.     Per HPI unless specifically indicated above     Objective:    BP 132/90 (BP Location: Left Arm, Cuff Size: Small)   Pulse 97   Temp 97.9 F (36.6 C)   Wt 72 lb 9.6 oz (32.9 kg)   SpO2 98%   BMI 12.86 kg/m   Wt Readings from Last 3 Encounters:  10/31/16 72 lb 9.6 oz (32.9 kg)  10/10/16 73 lb (33.1 kg)  09/11/16 70 lb 8.8 oz (32 kg)    Physical Exam  Constitutional: She is oriented to person, place, and time. She appears well-developed. She appears cachectic. She appears ill.  No distress.  HENT:  Head: Normocephalic and atraumatic.  Right Ear: Hearing normal.  Left Ear: Hearing normal.  Nose: Nose normal.  Eyes: Conjunctivae and lids are normal. Right eye exhibits no discharge. Left eye exhibits no discharge. No scleral icterus.  Cardiovascular: Normal rate, regular rhythm, normal heart sounds and intact distal pulses.  Exam  reveals no gallop and no friction rub.   No murmur heard. Pulmonary/Chest: Effort normal and breath sounds normal. No respiratory distress. She has no wheezes. She has no rales. She exhibits no tenderness.  Musculoskeletal: Normal range of motion.  Neurological: She is alert and oriented to person, place, and time.  Skin: Skin is warm, dry and intact. No rash noted. No erythema. No pallor.  Psychiatric: She has a normal mood and affect. Her speech is normal and behavior is normal. Judgment and thought content normal. Cognition and memory are normal.  Nursing note and vitals reviewed.   Results for orders placed or performed during the hospital encounter of 09/08/16  I-STAT creatinine  Result Value Ref Range   Creatinine, Ser 0.90 0.44 - 1.00 mg/dL      Assessment & Plan:   Problem List Items Addressed This Visit      Cardiovascular and Mediastinum   Benign hypertension with CKD (chronic kidney disease) stage IV ([place])    Better on recheck. Continue current regimen. Continue to monitor. Call with any concerns.       Relevant Medications   furosemide (LASIX) 20 MG tablet   atorvastatin (LIPITOR) 20 MG tablet   amLODipine (NORVASC) 2.5 MG tablet   Other Relevant Orders   Microalbumin, Urine Waived     Endocrine   Hypothyroid    Rechecking levels today. Await results.         Other   Hyperlipidemia    Rechecking levels today. Await results.       Relevant Medications   furosemide (LASIX) 20 MG tablet   atorvastatin (LIPITOR) 20 MG tablet   amLODipine (NORVASC) 2.5 MG tablet   Other Relevant Orders   Lipid Panel w/o Chol/HDL Ratio   Protein-calorie malnutrition, severe    Will start remeron. Rx sent to her pharmacy. Will check back in 1 month. Call with any concerns. Will also get home health in. Referral generated today.       Tobacco abuse    Will restart wellbutrin, watching weight very closely.       Adult failure to thrive - Primary    Will start remeron. Rx  sent to her pharmacy. Will check back in 1 month. Call with any concerns. Will also get home health in. Referral generated today.       Relevant Medications   mirtazapine (REMERON SOL-TAB) 15 MG disintegrating tablet   Other Relevant Orders   CBC with Differential/Platelet   Comprehensive metabolic panel   TSH   UA/M w/rflx Culture, Routine    Other Visit Diagnoses    Confusion       Unclear if this is just due to being in the dark. Will check labs. Will get home health in. Consider clapper or something to turn light on at night.    Chronic midline thoracic back pain       Seems to be due to no tissue protecting her spine. I think she would benefit from some form of egg crate or cushion on her bed. Will refer to home health.    Relevant Medications   mirtazapine (REMERON SOL-TAB) 15 MG disintegrating tablet  Follow up plan: Return 3-4 weeks , for follow up weight.

## 2016-10-31 NOTE — Assessment & Plan Note (Signed)
Rechecking levels today. Await results.  

## 2016-10-31 NOTE — Assessment & Plan Note (Signed)
Better on recheck. Continue current regimen. Continue to monitor. Call with any concerns.  

## 2016-10-31 NOTE — Assessment & Plan Note (Signed)
Will restart wellbutrin, watching weight very closely.

## 2016-11-01 ENCOUNTER — Telehealth: Payer: Self-pay | Admitting: Family Medicine

## 2016-11-01 ENCOUNTER — Inpatient Hospital Stay: Payer: Medicare HMO

## 2016-11-01 ENCOUNTER — Inpatient Hospital Stay: Payer: Medicare HMO | Attending: Oncology

## 2016-11-01 DIAGNOSIS — Z452 Encounter for adjustment and management of vascular access device: Secondary | ICD-10-CM | POA: Diagnosis not present

## 2016-11-01 DIAGNOSIS — Z85118 Personal history of other malignant neoplasm of bronchus and lung: Secondary | ICD-10-CM | POA: Insufficient documentation

## 2016-11-01 DIAGNOSIS — Z95828 Presence of other vascular implants and grafts: Secondary | ICD-10-CM

## 2016-11-01 LAB — COMPREHENSIVE METABOLIC PANEL
A/G RATIO: 1.6 (ref 1.2–2.2)
ALT: 43 IU/L — AB (ref 0–32)
AST: 39 IU/L (ref 0–40)
Albumin: 3.9 g/dL (ref 3.5–4.8)
Alkaline Phosphatase: 140 IU/L — ABNORMAL HIGH (ref 39–117)
BUN/Creatinine Ratio: 19 (ref 12–28)
BUN: 17 mg/dL (ref 8–27)
Bilirubin Total: 0.5 mg/dL (ref 0.0–1.2)
CALCIUM: 9.4 mg/dL (ref 8.7–10.3)
CO2: 25 mmol/L (ref 18–29)
CREATININE: 0.89 mg/dL (ref 0.57–1.00)
Chloride: 101 mmol/L (ref 96–106)
GFR, EST AFRICAN AMERICAN: 73 mL/min/{1.73_m2} (ref >59)
GFR, EST NON AFRICAN AMERICAN: 63 mL/min/{1.73_m2} (ref >59)
Globulin, Total: 2.5 g/dL (ref 1.5–4.5)
Glucose: 84 mg/dL (ref 65–99)
POTASSIUM: 4.6 mmol/L (ref 3.5–5.2)
Sodium: 141 mmol/L (ref 134–144)
TOTAL PROTEIN: 6.4 g/dL (ref 6.0–8.5)

## 2016-11-01 LAB — LIPID PANEL W/O CHOL/HDL RATIO
Cholesterol, Total: 179 mg/dL (ref 100–199)
HDL: 70 mg/dL (ref >39)
LDL CALC: 95 mg/dL (ref 0–99)
TRIGLYCERIDES: 71 mg/dL (ref 0–149)
VLDL CHOLESTEROL CAL: 14 mg/dL (ref 5–40)

## 2016-11-01 LAB — CBC WITH DIFFERENTIAL/PLATELET
BASOS ABS: 0 10*3/uL (ref 0.0–0.2)
BASOS: 0 %
EOS (ABSOLUTE): 0.1 10*3/uL (ref 0.0–0.4)
Eos: 1 %
Hematocrit: 39.8 % (ref 34.0–46.6)
Hemoglobin: 12.6 g/dL (ref 11.1–15.9)
IMMATURE GRANULOCYTES: 0 %
Immature Grans (Abs): 0 10*3/uL (ref 0.0–0.1)
LYMPHS: 12 %
Lymphocytes Absolute: 1.3 10*3/uL (ref 0.7–3.1)
MCH: 30.7 pg (ref 26.6–33.0)
MCHC: 31.7 g/dL (ref 31.5–35.7)
MCV: 97 fL (ref 79–97)
MONOS ABS: 0.7 10*3/uL (ref 0.1–0.9)
Monocytes: 7 %
NEUTROS PCT: 80 %
Neutrophils Absolute: 8.1 10*3/uL — ABNORMAL HIGH (ref 1.4–7.0)
PLATELETS: 196 10*3/uL (ref 150–379)
RBC: 4.1 x10E6/uL (ref 3.77–5.28)
RDW: 14.1 % (ref 12.3–15.4)
WBC: 10.2 10*3/uL (ref 3.4–10.8)

## 2016-11-01 LAB — TSH: TSH: 3.13 u[IU]/mL (ref 0.450–4.500)

## 2016-11-01 MED ORDER — HEPARIN SOD (PORK) LOCK FLUSH 100 UNIT/ML IV SOLN
500.0000 [IU] | Freq: Once | INTRAVENOUS | Status: AC
  Administered 2016-11-01: 500 [IU] via INTRAVENOUS

## 2016-11-01 MED ORDER — SODIUM CHLORIDE 0.9% FLUSH
10.0000 mL | INTRAVENOUS | Status: DC | PRN
  Administered 2016-11-01: 10 mL via INTRAVENOUS
  Filled 2016-11-01: qty 10

## 2016-11-01 NOTE — Telephone Encounter (Signed)
Please let Melody Green know that her labs came back normal and I'm just waiting on her urine. Thanks!

## 2016-11-01 NOTE — Telephone Encounter (Signed)
Patient notified

## 2016-11-01 NOTE — Progress Notes (Signed)
Survivorship Care Plan visit completed.  Treatment summary reviewed and given to patient.  ASCO answers booklet reviewed and given to patient.  CARE program and Cancer Transitions discussed with patient along with other resources cancer center offers to patients and caregivers.  Patient verbalized understanding.    

## 2016-11-01 NOTE — Telephone Encounter (Signed)
Spoke with patient, notified her that [place] did not call a pain patch in.

## 2016-11-02 ENCOUNTER — Other Ambulatory Visit: Payer: Self-pay | Admitting: Family Medicine

## 2016-11-02 ENCOUNTER — Telehealth: Payer: Self-pay | Admitting: Family Medicine

## 2016-11-02 MED ORDER — NITROFURANTOIN MONOHYD MACRO 100 MG PO CAPS: 100.0000 mg | ORAL_CAPSULE | Freq: Two times a day (BID) | ORAL | 0 refills | Status: DC

## 2016-11-02 NOTE — Telephone Encounter (Signed)
Patient notified

## 2016-11-02 NOTE — Telephone Encounter (Signed)
Please let Miss Melody Green know that her urine grew out bacteria. I've sent an antibiotic to her pharmacy, and that should make her feel a bit better. Thanks!

## 2016-11-04 LAB — UA/M W/RFLX CULTURE, ROUTINE
Bilirubin, UA: NEGATIVE
Glucose, UA: NEGATIVE
NITRITE UA: POSITIVE — AB
PH UA: 5 (ref 5.0–7.5)
RBC, UA: NEGATIVE
SPEC GRAV UA: 1.025 (ref 1.005–1.030)
Urobilinogen, Ur: 1 mg/dL (ref 0.2–1.0)

## 2016-11-04 LAB — MICROALBUMIN, URINE WAIVED
CREATININE, URINE WAIVED: 300 mg/dL (ref 10–300)
MICROALB, UR WAIVED: 150 mg/L — AB (ref 0–19)

## 2016-11-04 LAB — URINE CULTURE, REFLEX

## 2016-11-04 LAB — MICROSCOPIC EXAMINATION: RBC, UA: NONE SEEN /hpf (ref 0–?)

## 2016-11-06 ENCOUNTER — Telehealth: Payer: Self-pay | Admitting: Family Medicine

## 2016-11-06 NOTE — Telephone Encounter (Signed)
Is it ok to give verbal orders

## 2016-11-06 NOTE — Telephone Encounter (Signed)
Yes please

## 2016-11-06 NOTE — Telephone Encounter (Signed)
Called and left a message giving verbal orders.

## 2016-11-08 ENCOUNTER — Ambulatory Visit (INDEPENDENT_AMBULATORY_CARE_PROVIDER_SITE_OTHER): Payer: Medicare HMO

## 2016-11-08 ENCOUNTER — Ambulatory Visit (INDEPENDENT_AMBULATORY_CARE_PROVIDER_SITE_OTHER): Payer: Medicare HMO | Admitting: Vascular Surgery

## 2016-11-08 ENCOUNTER — Encounter (INDEPENDENT_AMBULATORY_CARE_PROVIDER_SITE_OTHER): Payer: Self-pay | Admitting: Vascular Surgery

## 2016-11-08 VITALS — BP 132/87 | HR 93 | Resp 16 | Wt 75.0 lb

## 2016-11-08 DIAGNOSIS — R1084 Generalized abdominal pain: Secondary | ICD-10-CM

## 2016-11-08 DIAGNOSIS — E785 Hyperlipidemia, unspecified: Secondary | ICD-10-CM

## 2016-11-08 DIAGNOSIS — I739 Peripheral vascular disease, unspecified: Secondary | ICD-10-CM | POA: Diagnosis not present

## 2016-11-08 NOTE — Progress Notes (Signed)
Subjective:    Patient ID: Melody Green, female    DOB: Dec 07, 1940, 76 y.o.   MRN: [id number] Chief Complaint  Patient presents with  . Follow-up   Patient presents to review vascular studies. She was last seen on 10/10/16 with a chief complaint of abdominal pain. Patient states abdominal pain occurs with eating. She has a history of what sounds like esophageal dysmotility. PAD and mesenteric studies without ischemia. Aorto-iliac today shows patent aorto-iliac bypass graft. Denies any fever, nausea or vomiting.    Review of Systems  Constitutional: Negative.   HENT: Negative.   Eyes: Negative.   Respiratory: Negative.   Cardiovascular: Negative.   Gastrointestinal: Positive for abdominal pain.  Endocrine: Negative.   Genitourinary: Negative.   Musculoskeletal: Negative.   Skin: Negative.   Allergic/Immunologic: Negative.   Neurological: Negative.   Hematological: Negative.   Psychiatric/Behavioral: Negative.       Objective:   Physical Exam  Constitutional: She is oriented to person, place, and time. She appears well-developed and well-nourished. No distress.  HENT:  Head: Normocephalic and atraumatic.  Eyes: Conjunctivae are normal. Pupils are equal, round, and reactive to light.  Neck: Normal range of motion.  Cardiovascular: Normal rate, regular rhythm and normal heart sounds.   Pulses:      Radial pulses are 2+ on the right side, and 2+ on the left side.  Pulmonary/Chest: Effort normal.  Abdominal: Soft.  Musculoskeletal: Normal range of motion.  Neurological: She is alert and oriented to person, place, and time.  Skin: Skin is warm and dry. She is not diaphoretic.  Psychiatric: She has a normal mood and affect. Her behavior is normal. Judgment and thought content normal.  Vitals reviewed.  BP 132/87   Pulse 93   Resp 16   Wt 75 lb (34 kg)   BMI 13.29 kg/m   Past Medical History:  Diagnosis Date  . Abdominal aneurysm ([place])   . Anxiety   . Cancer ([place])  03/10/2016   lung  . Chronic kidney disease   . Collagenous colitis   . COPD (chronic obstructive pulmonary disease) ([place])   . DDD (degenerative disc disease), cervical   . Depression   . Hernia of abdominal cavity   . Hyperlipidemia   . Hypertension   . Hypothyroidism   . Osteoporosis   . Pneumonia   . Stroke ([place])   . Tobacco abuse   . Urine incontinence   . Vitamin B 12 deficiency     Social History   Social History  . Marital status: Widowed    Spouse name: N/A  . Number of children: N/A  . Years of education: N/A   Occupational History  . Not on file.   Social History Main Topics  . Smoking status: Current Every Day Smoker    Packs/day: 0.25    Types: Cigarettes  . Smokeless tobacco: Never Used     Comment: quit smoking 03/04/16  . Alcohol use No  . Drug use: No  . Sexual activity: No   Other Topics Concern  . Not on file   Social History Narrative  . No narrative on file    Past Surgical History:  Procedure Laterality Date  . ABDOMINAL AORTIC ANEURYSM REPAIR    . ABDOMINAL HYSTERECTOMY    . APPENDECTOMY    . BACK SURGERY  [phone]  . CHOLECYSTECTOMY    . FLEXIBLE BRONCHOSCOPY N/A 03/17/2016   Procedure: FLEXIBLE BRONCHOSCOPY;  Surgeon: Wilhelmina Mcardle, MD;  Location: [facility]  ORS;  Service: Pulmonary;  Laterality: N/A;  . KYPHOPLASTY N/A 07/13/2016   Procedure: KYPHOPLASTY;  Surgeon: Hessie Knows, MD;  Location: [facility] ORS;  Service: Orthopedics;  Laterality: N/A;  . KYPHOPLASTY N/A 08/03/2016   Procedure: KYPHOPLASTY T12, L2;  Surgeon: Hessie Knows, MD;  Location: [facility] ORS;  Service: Orthopedics;  Laterality: N/A;  . PERIPHERAL VASCULAR CATHETERIZATION N/A 04/03/2016   Procedure: Glori Luis Cath Insertion;  Surgeon: Algernon Huxley, MD;  Location: [facility];  Service: Cardiovascular;  Laterality: N/A;    Family History  Problem Relation Age of Onset  . Diabetes Mother   . Diabetes Father     Allergies  Allergen Reactions  . Hydrocodone Itching and  Rash  . Codeine Rash  . Oxycodone Itching and Rash       Assessment & Plan:  Patient presents to review vascular studies. She was last seen on 10/10/16 with a chief complaint of abdominal pain. Patient states abdominal pain occurs with eating. She has a history of what sounds like esophageal dysmotility. PAD and mesenteric studies without ischemia. Aorto-iliac today shows patent aorto-iliac bypass graft. Denies any fever, nausea or vomiting.   1. PVD (peripheral vascular disease) ([place]) s/p Aorto-Iliac (about 12 years ago) - Stable Patient with patent bypass. No claudication. No ulceration. Continued abdominal pain. Patient reports hx of what sounds like esophageal dysmotility.  Recommend workup with gastroeneterologist. Patient would like to speak with internist first.   2. Hyperlipidemia, unspecified hyperlipidemia type - Stable Encouraged good control as its slows the progression of atherosclerotic disease  3. Generalized abdominal pain - Stable As above.  Current Outpatient Prescriptions on File Prior to Visit  Medication Sig Dispense Refill  . acetaminophen (TYLENOL) 500 MG tablet Take 1,000 mg by mouth every 6 (six) hours as needed for moderate pain or headache.    Marland Kitchen amLODipine (NORVASC) 2.5 MG tablet Take 1 tablet (2.5 mg total) by mouth daily. 90 tablet 1  . atorvastatin (LIPITOR) 20 MG tablet Take 1 tablet (20 mg total) by mouth at bedtime. 90 tablet 1  . bisacodyl (DULCOLAX) 5 MG EC tablet Take 5 mg by mouth daily as needed for moderate constipation.    . clopidogrel (PLAVIX) 75 MG tablet Take 75 mg by mouth at bedtime.    . cyanocobalamin (,VITAMIN B-12,) 1000 MCG/ML injection Inject 1,000 mcg into the muscle every 30 (thirty) days.    . cyclobenzaprine (FLEXERIL) 10 MG tablet Take 1 tablet (10 mg total) by mouth 2 (two) times daily with a meal. (Patient taking differently: Take 10 mg by mouth every 8 (eight) hours as needed for muscle spasms. ) 30 tablet 0  . furosemide  (LASIX) 20 MG tablet Take 20 mg by mouth daily as needed.    Marland Kitchen levothyroxine (SYNTHROID, LEVOTHROID) 75 MCG tablet TAKE 1 TABLET EVERY DAY BEFORE BREAKFAST 90 tablet 1  . lidocaine-prilocaine (EMLA) cream Apply to port site 1 hour before port is accessed each time 30 g 3  . LORazepam (ATIVAN) 1 MG tablet Take 1 tablet (1 mg total) by mouth 2 (two) times daily as needed for anxiety. 30 tablet 2  . mirtazapine (REMERON SOL-TAB) 15 MG disintegrating tablet Take 1 tablet (15 mg total) by mouth at bedtime. 30 tablet 1  . nitrofurantoin, macrocrystal-monohydrate, (MACROBID) 100 MG capsule Take 1 capsule (100 mg total) by mouth 2 (two) times daily. 14 capsule 0  . omeprazole (PRILOSEC) 20 MG capsule TAKE 1 CAPSULE (20 MG TOTAL) BY MOUTH DAILY. 90 capsule 1  .  ondansetron (ZOFRAN) 8 MG tablet Take 1 tablet (8 mg total) by mouth 2 (two) times daily as needed for refractory nausea / vomiting. 30 tablet 2  . prochlorperazine (COMPAZINE) 10 MG tablet Take 1 tablet (10 mg total) by mouth every 6 (six) hours as needed (Nausea or vomiting). 30 tablet 1  . traMADol (ULTRAM) 50 MG tablet Take 1 tablet (50 mg total) by mouth every 6 (six) hours as needed for moderate pain. 30 tablet 2   No current facility-administered medications on file prior to visit.     There are no Patient Instructions on file for this visit. No Follow-up on file.   Kenishia Plack A Shynia Daleo, PA-C

## 2016-11-16 ENCOUNTER — Telehealth: Payer: Self-pay | Admitting: Family Medicine

## 2016-11-24 NOTE — Telephone Encounter (Signed)
ERROR

## 2016-12-04 NOTE — Telephone Encounter (Signed)
Melody Green with [facility] called regarding patient needing to have a bone density test done before 07-10   She is faxing over a documentation also.  Patient has an appt 6/6 with Dr Wynetta Emery.  She is asking that you address this with her.  Thanks  [phone] if you need to speak with [facility]

## 2016-12-05 ENCOUNTER — Telehealth: Payer: Self-pay

## 2016-12-05 ENCOUNTER — Ambulatory Visit (INDEPENDENT_AMBULATORY_CARE_PROVIDER_SITE_OTHER): Payer: Medicare HMO | Admitting: Family Medicine

## 2016-12-05 ENCOUNTER — Encounter: Payer: Self-pay | Admitting: Family Medicine

## 2016-12-05 VITALS — BP 101/67 | HR 110 | Temp 98.1°F | Wt 72.9 lb

## 2016-12-05 DIAGNOSIS — S41112A Laceration without foreign body of left upper arm, initial encounter: Secondary | ICD-10-CM

## 2016-12-05 DIAGNOSIS — E43 Unspecified severe protein-calorie malnutrition: Secondary | ICD-10-CM

## 2016-12-05 DIAGNOSIS — Z13820 Encounter for screening for osteoporosis: Secondary | ICD-10-CM

## 2016-12-05 DIAGNOSIS — C3492 Malignant neoplasm of unspecified part of left bronchus or lung: Secondary | ICD-10-CM

## 2016-12-05 DIAGNOSIS — R627 Adult failure to thrive: Secondary | ICD-10-CM | POA: Diagnosis not present

## 2016-12-05 MED ORDER — OMEPRAZOLE 20 MG PO CPDR: DELAYED_RELEASE_CAPSULE | ORAL | 1 refills | Status: DC

## 2016-12-05 MED ORDER — MIRTAZAPINE 30 MG PO TBDP: 30.0000 mg | ORAL_TABLET | Freq: Every day | ORAL | 1 refills | Status: AC

## 2016-12-05 MED ORDER — GUAIFENESIN 200 MG PO TABS: 400.0000 mg | ORAL_TABLET | Freq: Every day | ORAL | 3 refills | Status: AC

## 2016-12-05 NOTE — Telephone Encounter (Signed)
-----   Message from Melody Green, [place] sent at 12/05/2016  1:11 PM EDT ----- Can we see if we can get her in for a bone density tomorrow when she is going for her CT scan for Dr. Grayland Ormond? Thanks!

## 2016-12-05 NOTE — Telephone Encounter (Signed)
Patient notified

## 2016-12-05 NOTE — Assessment & Plan Note (Signed)
Following with Dr. Grayland Ormond. To have CT scan tomorrow. No evidence of reocurrance right now, was supposed to see pulmonology, but hasn't seen them. Patient states that she is not going to have chemo again. Has not had any discussion with palliative care regarding her goals. Will have them come to her for this discussion. Referral generated today.

## 2016-12-05 NOTE — Telephone Encounter (Signed)
Called [employer] and left a message letting them know that patient is scheduled for a scan in July.

## 2016-12-05 NOTE — Assessment & Plan Note (Addendum)
Has lost another 3 pounds on remeron. Will increase to 30mg  today and recheck in 2 weeks. May require megace. Discussion today regarding hospice. Would be interested in having palliative care come to the house and talk to her about her goals. Order placed today.

## 2016-12-05 NOTE — Progress Notes (Signed)
BP 101/67 (BP Location: Right Arm, Patient Position: Sitting, Cuff Size: Small)   Pulse (!) 110   Temp 98.1 F (36.7 C)   Wt 72 lb 14.4 oz (33.1 kg)   SpO2 96%   BMI 12.91 kg/m    Subjective:    Patient ID: Melody Green, female    DOB: Dec 05, 1940, 76 y.o.   MRN: [id number]  HPI: Melody Green is a 76 y.o. female  Chief Complaint  Patient presents with  . Weight Check  . Medication Refill    Tramadol and flexeril   WEIGHT LOSS- known lung cancer, on chemo, started on remeron last visit Duration: 2 years Amount of weight loss: 24 lbs  Fevers: no Decreased appetite: yes Night sweats: no Dysphagia/odynophagia: no Chest pain: no Shortness of breath: no Cough: yes Nausea: yes Vomiting: no Abdominal pain: yes Blood in stool: no Easy bruising/bleeding: yes Jaundice: no Polydipsia/polyuria: no Depression: no  Relevant past medical, surgical, family and social history reviewed and updated as indicated. Interim medical history since our last visit reviewed. Allergies and medications reviewed and updated.  Review of Systems  Constitutional: Positive for unexpected weight change. Negative for activity change, appetite change, chills, diaphoresis, fatigue and fever.  Respiratory: Positive for cough. Negative for apnea, choking, chest tightness, shortness of breath, wheezing and stridor.   Cardiovascular: Negative.   Gastrointestinal: Positive for abdominal distention, abdominal pain and nausea. Negative for anal bleeding, blood in stool, constipation, diarrhea, rectal pain and vomiting.  Musculoskeletal: Positive for back pain and myalgias. Negative for arthralgias, gait problem, joint swelling, neck pain and neck stiffness.  Skin: Positive for pallor and wound. Negative for color change and rash.  Psychiatric/Behavioral: Negative.     Per HPI unless specifically indicated above     Objective:    BP 101/67 (BP Location: Right Arm, Patient Position: Sitting, Cuff Size:  Small)   Pulse (!) 110   Temp 98.1 F (36.7 C)   Wt 72 lb 14.4 oz (33.1 kg)   SpO2 96%   BMI 12.91 kg/m   Wt Readings from Last 3 Encounters:  12/05/16 72 lb 14.4 oz (33.1 kg)  11/08/16 75 lb (34 kg)  10/31/16 72 lb 9.6 oz (32.9 kg)    Physical Exam  Constitutional: She is oriented to person, place, and time. She appears cachectic. No distress.  HENT:  Head: Normocephalic and atraumatic.  Right Ear: Hearing normal.  Left Ear: Hearing normal.  Nose: Nose normal.  Eyes: Conjunctivae and lids are normal. Right eye exhibits no discharge. Left eye exhibits no discharge. No scleral icterus.  Cardiovascular: Normal rate, regular rhythm, normal heart sounds and intact distal pulses.  Exam reveals no gallop and no friction rub.   No murmur heard. Pulmonary/Chest: Effort normal and breath sounds normal. No respiratory distress. She has no wheezes. She has no rales. She exhibits no tenderness.  Musculoskeletal: Normal range of motion.  Neurological: She is alert and oriented to person, place, and time.  Skin: Skin is warm, dry and intact. No rash noted. She is not diaphoretic. No erythema. No pallor.  Skin tear inner upper L arm- healing well  Psychiatric: She has a normal mood and affect. Her speech is normal and behavior is normal. Judgment and thought content normal. Cognition and memory are normal.  Nursing note and vitals reviewed.   Results for orders placed or performed in visit on 10/31/16  Microscopic Examination  Result Value Ref Range   WBC, UA 6-10 (A) 0 -  5 /hpf   RBC, UA None seen 0 - 2 /hpf   Epithelial Cells (non renal) 0-10 0 - 10 /hpf   Bacteria, UA Many (A) None seen/Few  CBC with Differential/Platelet  Result Value Ref Range   WBC 10.2 3.4 - 10.8 x10E3/uL   RBC 4.10 3.77 - 5.28 x10E6/uL   Hemoglobin 12.6 11.1 - 15.9 g/dL   Hematocrit 39.8 34.0 - 46.6 %   MCV 97 79 - 97 fL   MCH 30.7 26.6 - 33.0 pg   MCHC 31.7 31.5 - 35.7 g/dL   RDW 14.1 12.3 - 15.4 %    Platelets 196 150 - 379 x10E3/uL   Neutrophils 80 Not Estab. %   Lymphs 12 Not Estab. %   Monocytes 7 Not Estab. %   Eos 1 Not Estab. %   Basos 0 Not Estab. %   Neutrophils Absolute 8.1 (H) 1.4 - 7.0 x10E3/uL   Lymphocytes Absolute 1.3 0.7 - 3.1 x10E3/uL   Monocytes Absolute 0.7 0.1 - 0.9 x10E3/uL   EOS (ABSOLUTE) 0.1 0.0 - 0.4 x10E3/uL   Basophils Absolute 0.0 0.0 - 0.2 x10E3/uL   Immature Granulocytes 0 Not Estab. %   Immature Grans (Abs) 0.0 0.0 - 0.1 x10E3/uL  Comprehensive metabolic panel  Result Value Ref Range   Glucose 84 65 - 99 mg/dL   BUN 17 8 - 27 mg/dL   Creatinine, Ser 0.89 0.57 - 1.00 mg/dL   GFR calc non Af Amer 63 >59 mL/min/1.73   GFR calc Af Amer 73 >59 mL/min/1.73   BUN/Creatinine Ratio 19 12 - 28   Sodium 141 134 - 144 mmol/L   Potassium 4.6 3.5 - 5.2 mmol/L   Chloride 101 96 - 106 mmol/L   CO2 25 18 - 29 mmol/L   Calcium 9.4 8.7 - 10.3 mg/dL   Total Protein 6.4 6.0 - 8.5 g/dL   Albumin 3.9 3.5 - 4.8 g/dL   Globulin, Total 2.5 1.5 - 4.5 g/dL   Albumin/Globulin Ratio 1.6 1.2 - 2.2   Bilirubin Total 0.5 0.0 - 1.2 mg/dL   Alkaline Phosphatase 140 (H) 39 - 117 IU/L   AST 39 0 - 40 IU/L   ALT 43 (H) 0 - 32 IU/L  TSH  Result Value Ref Range   TSH 3.130 0.450 - 4.500 uIU/mL  UA/M w/rflx Culture, Routine  Result Value Ref Range   Specific Gravity, UA 1.025 1.005 - 1.030   pH, UA 5.0 5.0 - 7.5   Color, UA Yellow Yellow   Appearance Ur Cloudy (A) Clear   Leukocytes, UA Trace (A) Negative   Protein, UA 1+ (A) Negative/Trace   Glucose, UA Negative Negative   Ketones, UA Trace (A) Negative   RBC, UA Negative Negative   Bilirubin, UA Negative Negative   Urobilinogen, Ur 1.0 0.2 - 1.0 mg/dL   Nitrite, UA Positive (A) Negative   Microscopic Examination See below:    Urinalysis Reflex Comment   Microalbumin, Urine Waived  Result Value Ref Range   Microalb, Ur Waived 150 (H) 0 - 19 mg/L   Creatinine, Urine Waived 300 10 - 300 mg/dL   Microalb/Creat Ratio  30-300 (H) <30 mg/g  Lipid Panel w/o Chol/HDL Ratio  Result Value Ref Range   Cholesterol, Total 179 100 - 199 mg/dL   Triglycerides 71 0 - 149 mg/dL   HDL 70 >39 mg/dL   VLDL Cholesterol Cal 14 5 - 40 mg/dL   LDL Calculated 95 0 - 99 mg/dL  Urine  Culture, Routine  Result Value Ref Range   Urine Culture, Routine Final report (A)    Urine Culture result 1 Comment (A)    ANTIMICROBIAL SUSCEPTIBILITY Comment       Assessment & Plan:   Problem List Items Addressed This Visit      Respiratory   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 Grayland Ormond. To have CT scan tomorrow. No evidence of reocurrance right now, was supposed to see pulmonology, but hasn't seen them. Patient states that she is not going to have chemo again. Has not had any discussion with palliative care regarding her goals. Will have them come to her for this discussion. Referral generated today.         Other   Protein-calorie malnutrition, severe    Has lost another 3 pounds on remeron. Will increase to 30mg  today and recheck in 2 weeks. May require megace. Discussion today regarding hospice. Would be interested in having palliative care come to the house and talk to her about her goals. Order placed today.      Adult failure to thrive - Primary    Has lost another 3 pounds on remeron. Will increase to 30mg  today and recheck in 2 weeks. May require megace. Discussion today regarding hospice. Would be interested in having palliative care come to the house and talk to her about her goals. Order placed today.      Relevant Medications   mirtazapine (REMERON SOL-TAB) 30 MG disintegrating tablet    Other Visit Diagnoses    Screening for osteoporosis       To have tomorrow when she gets her CT scan for Dr. Grayland Ormond.    Skin tear of left upper arm without complication, initial encounter       Wrapped today. Continue to monitor. Call with any concerns.        Follow up  plan: Return 2-3 weeks, for follow up weight.

## 2016-12-05 NOTE — Telephone Encounter (Signed)
Bone Density scheduled for 01/23/17 @ 2:20pm.   Called patient, no answer, left message for patient to return my call.

## 2016-12-06 ENCOUNTER — Telehealth: Payer: Self-pay | Admitting: *Deleted

## 2016-12-06 ENCOUNTER — Ambulatory Visit
Admission: RE | Admit: 2016-12-06 | Discharge: 2016-12-06 | Disposition: A | Discharge status: Home / Self Care | Payer: Medicare HMO | Source: Ambulatory Visit | Attending: Oncology | Admitting: Oncology

## 2016-12-06 DIAGNOSIS — J90 Pleural effusion, not elsewhere classified: Secondary | ICD-10-CM | POA: Insufficient documentation

## 2016-12-06 DIAGNOSIS — I700 Atherosclerosis of aorta: Secondary | ICD-10-CM | POA: Diagnosis not present

## 2016-12-06 DIAGNOSIS — C3492 Malignant neoplasm of unspecified part of left bronchus or lung: Secondary | ICD-10-CM | POA: Insufficient documentation

## 2016-12-06 DIAGNOSIS — R590 Localized enlarged lymph nodes: Secondary | ICD-10-CM | POA: Insufficient documentation

## 2016-12-06 DIAGNOSIS — I2510 Atherosclerotic heart disease of native coronary artery without angina pectoris: Secondary | ICD-10-CM | POA: Insufficient documentation

## 2016-12-06 HISTORY — DX: Malignant neoplasm of unspecified part of left bronchus or lung: C34.92

## 2016-12-06 MED ORDER — IOPAMIDOL (ISOVUE-300) INJECTION 61%
60.0000 mL | Freq: Once | INTRAVENOUS | Status: AC | PRN
  Administered 2016-12-06: 60 mL via INTRAVENOUS

## 2016-12-06 NOTE — Telephone Encounter (Signed)
Called report:

## 2016-12-06 NOTE — Telephone Encounter (Signed)
Patient has an appointment on June 14, this can be moved sooner if needed.

## 2016-12-06 NOTE — Telephone Encounter (Signed)
According to chart, she went to see PCP yesterday with failure to thrive and it is document ed that she is not goin g to do chemo again. So with that do you want to leave her appt for the 14th?

## 2016-12-06 NOTE — Telephone Encounter (Signed)
Called report:  IMPRESSION: 1. Findings most consistent with extensive recurrent and progressive disease. Adenopathy throughout the mediastinum, low neck, and within the right infrahilar region. New and increased pulmonary nodularity as detailed above. 2. New small left pleural effusion. 3.  Coronary artery atherosclerosis. Aortic atherosclerosis. 4. Esophageal air fluid level suggests dysmotility or gastroesophageal reflux. These results will be called to the ordering clinician or representative by the Radiologist Assistant, and communication documented in the PACS or zVision Dashboard

## 2016-12-07 ENCOUNTER — Telehealth: Payer: Self-pay | Admitting: Family Medicine

## 2016-12-07 NOTE — Telephone Encounter (Signed)
Spoke with patient, she needed to know what the number for hospice palliative care was, number given.

## 2016-12-07 NOTE — Telephone Encounter (Signed)
Patient would like to speak with CMA regarding advice from her last visit pertaining to a call she had received.   Please Advise.  Thank you

## 2016-12-11 ENCOUNTER — Ambulatory Visit: Payer: Medicare HMO | Admitting: Oncology

## 2016-12-11 ENCOUNTER — Inpatient Hospital Stay: Payer: Medicare HMO

## 2016-12-13 NOTE — Progress Notes (Deleted)
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  INTERVAL HISTORY: Patient returns to clinic today for further evaluation and in discussion of her imaging results. She continues to have significant back pain that is being addressed by orthopedics. She has a good appetite, but continues to lose weight. She continues to have chronic shortness of breath. She continues to have weakness and fatigue, but this is relatively unchanged. She denies any fevers or illnesses. She denies any chest pain, cough, or hemoptysis. She has a fair appetite. She has no nausea, vomiting, constipation, or diarrhea. She has no urinary complaints. Patient offers no further specific complaints.  REVIEW OF SYSTEMS:   Review of Systems  Constitutional: Positive for malaise/fatigue. Negative for fever and weight loss.  Eyes: Negative.  Negative for blurred vision and double vision.  Respiratory: Positive for shortness of breath. Negative for cough and hemoptysis.   Cardiovascular: Negative for chest pain and leg swelling.  Gastrointestinal: Negative.  Negative for abdominal pain.  Genitourinary: Negative.   Musculoskeletal: Positive for back pain.  Neurological: Positive for weakness. Negative for focal weakness and headaches.  Psychiatric/Behavioral: Negative.  The patient is not nervous/anxious.     As per HPI. Otherwise, a complete review of systems is negative.  PAST MEDICAL HISTORY: Past Medical History:  Diagnosis Date  . Abdominal aneurysm  ([place])   . Anxiety   . Cancer of left lung ([place]) 03/10/2016   lung  . Chronic kidney disease   . Collagenous colitis   . COPD (chronic obstructive pulmonary disease) ([place])   . DDD (degenerative disc disease), cervical   . Depression   . Hernia of abdominal cavity   . Hyperlipidemia   . Hypertension   . Hypothyroidism   . Osteoporosis   . Pneumonia   . Stroke ([place])   . Tobacco abuse   . Urine incontinence   . Vitamin B 12 deficiency     PAST SURGICAL HISTORY: Past Surgical History:  Procedure Laterality Date  . ABDOMINAL AORTIC ANEURYSM REPAIR    . ABDOMINAL HYSTERECTOMY    . APPENDECTOMY    . BACK SURGERY  [phone]  . CHOLECYSTECTOMY    . FLEXIBLE BRONCHOSCOPY N/A 03/17/2016   Procedure: FLEXIBLE BRONCHOSCOPY;  Surgeon: Wilhelmina Mcardle, MD;  Location: [facility];  Service: Pulmonary;  Laterality: N/A;  . KYPHOPLASTY N/A 07/13/2016   Procedure: KYPHOPLASTY;  Surgeon: Hessie Knows, MD;  Location: [facility] ORS;  Service: Orthopedics;  Laterality: N/A;  . KYPHOPLASTY N/A 08/03/2016   Procedure: KYPHOPLASTY T12, L2;  Surgeon: Hessie Knows, MD;  Location: [facility];  Service: Orthopedics;  Laterality: N/A;  . PERIPHERAL VASCULAR CATHETERIZATION N/A 04/03/2016   Procedure: Glori Luis Cath Insertion;  Surgeon: Algernon Huxley, MD;  Location: [facility];  Service: Cardiovascular;  Laterality: N/A;    FAMILY HISTORY: Family History  Problem Relation Age of Onset  . Diabetes Mother   . Diabetes Father     ADVANCED DIRECTIVES (Y/N):  N  HEALTH MAINTENANCE: Social History  Substance Use Topics  . Smoking status: Current Every Day Smoker  Packs/day: 0.25    Types: Cigarettes  . Smokeless tobacco: Never Used     Comment: quit smoking 03/04/16  . Alcohol use No     Colonoscopy:  PAP:  Bone density:  Lipid panel:  Allergies  Allergen Reactions  . Hydrocodone Itching and Rash  . Codeine Rash  . Oxycodone Itching and Rash    Current Outpatient Prescriptions  Medication Sig  Dispense Refill  . acetaminophen (TYLENOL) 500 MG tablet Take 1,000 mg by mouth every 6 (six) hours as needed for moderate pain or headache.    Marland Kitchen amLODipine (NORVASC) 2.5 MG tablet Take 1 tablet (2.5 mg total) by mouth daily. 90 tablet 1  . atorvastatin (LIPITOR) 20 MG tablet Take 1 tablet (20 mg total) by mouth at bedtime. 90 tablet 1  . bisacodyl (DULCOLAX) 5 MG EC tablet Take 5 mg by mouth daily as needed for moderate constipation.    . clopidogrel (PLAVIX) 75 MG tablet Take 75 mg by mouth at bedtime.    . cyanocobalamin (,VITAMIN B-12,) 1000 MCG/ML injection Inject 1,000 mcg into the muscle every 30 (thirty) days.    . cyclobenzaprine (FLEXERIL) 10 MG tablet Take 1 tablet (10 mg total) by mouth 2 (two) times daily with a meal. (Patient taking differently: Take 10 mg by mouth every 8 (eight) hours as needed for muscle spasms. ) 30 tablet 0  . furosemide (LASIX) 20 MG tablet Take 20 mg by mouth daily as needed.    Marland Kitchen guaiFENesin 200 MG tablet Take 2 tablets (400 mg total) by mouth daily. 180 tablet 3  . levothyroxine (SYNTHROID, LEVOTHROID) 75 MCG tablet TAKE 1 TABLET EVERY DAY BEFORE BREAKFAST 90 tablet 1  . lidocaine-prilocaine (EMLA) cream Apply to port site 1 hour before port is accessed each time 30 g 3  . LORazepam (ATIVAN) 1 MG tablet Take 1 tablet (1 mg total) by mouth 2 (two) times daily as needed for anxiety. 30 tablet 2  . mirtazapine (REMERON SOL-TAB) 30 MG disintegrating tablet Take 1 tablet (30 mg total) by mouth at bedtime. 30 tablet 1  . omeprazole (PRILOSEC) 20 MG capsule TAKE 1 CAPSULE (20 MG TOTAL) BY MOUTH DAILY. 90 capsule 1  . ondansetron (ZOFRAN) 8 MG tablet Take 1 tablet (8 mg total) by mouth 2 (two) times daily as needed for refractory nausea / vomiting. 30 tablet 2  . prochlorperazine (COMPAZINE) 10 MG tablet Take 1 tablet (10 mg total) by mouth every 6 (six) hours as needed (Nausea or vomiting). 30 tablet 1  . traMADol (ULTRAM) 50 MG tablet Take 1 tablet (50 mg total) by  mouth every 6 (six) hours as needed for moderate pain. 30 tablet 2   No current facility-administered medications for this visit.     OBJECTIVE: There were no vitals filed for this visit.   There is no height or weight on file to calculate BMI.    ECOG FS:1 - Symptomatic but completely ambulatory  General: Thin, no acute distress. Eyes: Pink conjunctiva, anicteric sclera. Lungs: Clear to auscultation bilaterally. Heart: Regular rate and rhythm. No rubs, murmurs, or gallops. Abdomen: Mildly distended, nontender.. Musculoskeletal: No edema, cyanosis, or clubbing. Neuro: Alert, answering all questions appropriately. Cranial nerves grossly intact. Skin: No rashes or petechiae noted. Psych: Normal affect.   LAB RESULTS:  Lab Results  Component Value Date   NA 141 10/31/2016   K 4.6 10/31/2016   CL 101 10/31/2016   CO2 25 10/31/2016   GLUCOSE 84 10/31/2016   BUN 17  10/31/2016   CREATININE 0.89 10/31/2016   CALCIUM 9.4 10/31/2016   PROT 6.4 10/31/2016   ALBUMIN 3.9 10/31/2016   AST 39 10/31/2016   ALT 43 (H) 10/31/2016   ALKPHOS 140 (H) 10/31/2016   BILITOT 0.5 10/31/2016   GFRNONAA 63 10/31/2016   GFRAA 73 10/31/2016    Lab Results  Component Value Date   WBC 10.2 10/31/2016   NEUTROABS 8.1 (H) 10/31/2016   HGB 12.6 10/31/2016   HCT 39.8 10/31/2016   MCV 97 10/31/2016   PLT 196 10/31/2016     STUDIES: Ct Chest W Contrast  Result Date: 12/06/2016 CLINICAL DATA:  Left lung cancer diagnosed in September. On chemotherapy. Cough and intermittent chest pain. Current smoker. EXAM: CT CHEST WITH CONTRAST TECHNIQUE: Multidetector CT imaging of the chest was performed during intravenous contrast administration. CONTRAST:  76mL ISOVUE-300 IOPAMIDOL (ISOVUE-300) INJECTION 61% COMPARISON:  09/08/2016.  PET 07/17/2016. FINDINGS: Cardiovascular: Advanced aortic and branch vessel atherosclerosis. Normal heart size. Multivessel coronary artery atherosclerosis. No central pulmonary  embolism, on this non-dedicated study Mediastinum/Nodes: New right supraclavicular/ low jugular adenopathy at 9 mm on image 16/series infiltrative mediastinal tumor/adenopathy. fills the subcarinal station and AP window regions. Subcarinal component measures 4.0 x 3.3 cm on image 61/series 2.Partially calcified right infrahilar node is newly enlarged 1.8 cm on image 80/series 2. Left infrahilar adenopathy versus central extension of pulmonary metastasis on image 69/series 2.Upper esophageal fluid level on image 28/series 2. Lungs/Pleura: New small left pleural effusion. New soft tissue density within the right middle lobe bronchus and branches, including on image 79/series 3. Left upper lobe nodularity, much of which is within the bronchi, is new, including on image 33/series 3. Redemonstration of scarring in the left lower lobe. New areas of surrounding nodularity. Example medially at 12 mm on image 87/ series 3. Upper Abdomen: Beam hardening artifact from vertebral augmentation. Normal imaged portions of the spleen, liver, stomach, pancreas, adrenal glands, kidneys. Abdominal aortic and branch vessel atherosclerosis. Musculoskeletal: Osteopenia. Vertebral augmentation at T10 through L2. Mild T6 compression deformity is unchanged. IMPRESSION: 1. Findings most consistent with extensive recurrent and progressive disease. Adenopathy throughout the mediastinum, low neck, and within the right infrahilar region. New and increased pulmonary nodularity as detailed above. 2. New small left pleural effusion. 3.  Coronary artery atherosclerosis. Aortic atherosclerosis. 4. Esophageal air fluid level suggests dysmotility or gastroesophageal reflux. These results will be called to the ordering clinician or representative by the Radiologist Assistant, and communication documented in the PACS or zVision Dashboard. Electronically Signed   By: Abigail Miyamoto M.D.   On: 12/06/2016 13:47    ASSESSMENT: Stage IV left hilar small cell  lung cancer with right lung metastasis  PLAN:     1.  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 Because patient was noted to have disease that was not seen on PET scan or CT scan at her initial diagnosis, have referred patient back to pulmonology for further evaluation. Patient will likely require additional chemotherapy in the future. Return to clinic in 3 months with repeat imaging and further evaluation. If she has progression of disease, would use carboplatinum and etoposide as previous.  2. Headaches: MRI negative for metastasis, monitor. 3. Pain: Continue treatment evaluation per orthopedics.  4. Weight loss: Likely multifactorial including possible underlying malignancy. Patient states she has a good appetite. 5.  Thrombocytopenia: Resolved. Secondary chemotherapy, monitor.  6. Leukocytosis: Resolved.   Patient expressed understanding and was in agreement with this plan. She also understands that She can call clinic at any time with any questions, concerns, or complaints.   Cancer Staging Primary cancer of left lung metastatic to other site [facility]) Staging form: Lung, AJCC 7th Edition - Clinical stage from 03/12/2016: Stage IV (T3, N3, M1a) - Signed by Lloyd Huger, MD on 03/26/2016   Lloyd Huger, MD   12/13/2016 11:52 PM

## 2016-12-14 ENCOUNTER — Inpatient Hospital Stay: Payer: Medicare HMO

## 2016-12-14 ENCOUNTER — Inpatient Hospital Stay: Payer: Medicare HMO | Admitting: Oncology

## 2016-12-19 ENCOUNTER — Telehealth: Payer: Self-pay | Admitting: Family Medicine

## 2016-12-19 ENCOUNTER — Encounter: Payer: Self-pay | Admitting: Family Medicine

## 2016-12-19 MED ORDER — OMEPRAZOLE 40 MG PO CPDR: 40.0000 mg | DELAYED_RELEASE_CAPSULE | Freq: Every day | ORAL | 3 refills | Status: AC

## 2016-12-19 MED ORDER — CYCLOBENZAPRINE HCL 10 MG PO TABS: 10.0000 mg | ORAL_TABLET | Freq: Three times a day (TID) | ORAL | 1 refills | Status: AC | PRN

## 2016-12-19 MED ORDER — ALBUTEROL SULFATE HFA 108 (90 BASE) MCG/ACT IN AERS: 2.0000 | INHALATION_SPRAY | Freq: Four times a day (QID) | RESPIRATORY_TRACT | 0 refills | Status: DC | PRN

## 2016-12-19 NOTE — Telephone Encounter (Signed)
Patient notified

## 2016-12-19 NOTE — Telephone Encounter (Signed)
Patient asked about pain medicine and muscle relaxer. Patient states she was suppose to be prescribed the medication during last visit but is unsure why she has not received it.    Last OV: 12/05/2016  Please Advise.  Thank you

## 2016-12-19 NOTE — Telephone Encounter (Signed)
Muscle relaxor sent to her pharmacy. She should have refills on her tramadol-- is she out?

## 2016-12-19 NOTE — Telephone Encounter (Signed)
Higher dose omeprazole sent to her pharmacy

## 2016-12-19 NOTE — Addendum Note (Signed)
Addended by: Valerie Roys on: 12/19/2016 02:08 PM   Modules accepted: Orders

## 2016-12-19 NOTE — Telephone Encounter (Signed)
Tramdol refill at [facility], they will get it ready for patient.   Dr.Johnson, patient is experiencing extreme acid reflux, she states that it gets so bad she has to lay in the floor until it passes.  Is there a different medication that she can try for this or can she increase the Tramadol?

## 2016-12-19 NOTE — Telephone Encounter (Signed)
Patient is requesting a prescription for an inhaler.

## 2016-12-26 ENCOUNTER — Other Ambulatory Visit: Payer: Self-pay | Admitting: Family Medicine

## 2016-12-27 NOTE — Progress Notes (Signed)
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  INTERVAL HISTORY: Patient returns to clinic today for further evaluation and in discussion of her imaging results. She continues to lose weight and has a declining performance status. She continues to have chronic shortness of breath. She continues to have weakness and fatigue, but this is relatively unchanged. She denies any fevers or illnesses. She denies any chest pain, cough, or hemoptysis. She has a fair appetite. She has no nausea, vomiting, constipation, or diarrhea. She has no urinary complaints. Patient offers no further specific complaints.  REVIEW OF SYSTEMS:   Review of Systems  Constitutional: Positive for malaise/fatigue. Negative for fever and weight loss.  Eyes: Negative.  Negative for blurred vision and double vision.  Respiratory: Positive for shortness of breath. Negative for cough and hemoptysis.   Cardiovascular: Negative for chest pain and leg swelling.  Gastrointestinal: Negative.  Negative for abdominal pain.  Genitourinary: Negative.   Musculoskeletal: Positive for back pain.  Neurological: Positive for weakness. Negative for focal weakness and headaches.  Psychiatric/Behavioral: Negative.  The patient is not nervous/anxious.     As per HPI. Otherwise, a complete review of systems is negative.  PAST MEDICAL HISTORY: Past Medical History:  Diagnosis Date  . Abdominal aneurysm ([place])   . Anxiety   . Cancer of left lung ([place]) 03/10/2016   lung    . Chronic kidney disease   . Collagenous colitis   . COPD (chronic obstructive pulmonary disease) ([place])   . DDD (degenerative disc disease), cervical   . Depression   . Hernia of abdominal cavity   . Hyperlipidemia   . Hypertension   . Hypothyroidism   . Osteoporosis   . Pneumonia   . Stroke ([place])   . Tobacco abuse   . Urine incontinence   . Vitamin B 12 deficiency     PAST SURGICAL HISTORY: Past Surgical History:  Procedure Laterality Date  . ABDOMINAL AORTIC ANEURYSM REPAIR    . ABDOMINAL HYSTERECTOMY    . APPENDECTOMY    . BACK SURGERY  [phone]  . CHOLECYSTECTOMY    . FLEXIBLE BRONCHOSCOPY N/A 03/17/2016   Procedure: FLEXIBLE BRONCHOSCOPY;  Surgeon: Wilhelmina Mcardle, MD;  Location: [facility];  Service: Pulmonary;  Laterality: N/A;  . KYPHOPLASTY N/A 07/13/2016   Procedure: KYPHOPLASTY;  Surgeon: Hessie Knows, MD;  Location: [facility];  Service: Orthopedics;  Laterality: N/A;  . KYPHOPLASTY N/A 08/03/2016   Procedure: KYPHOPLASTY T12, L2;  Surgeon: Hessie Knows, MD;  Location: [facility];  Service: Orthopedics;  Laterality: N/A;  . PERIPHERAL VASCULAR CATHETERIZATION N/A 04/03/2016   Procedure: Glori Luis Cath Insertion;  Surgeon: Algernon Huxley, MD;  Location: [facility];  Service: Cardiovascular;  Laterality: N/A;    FAMILY HISTORY: Family History  Problem Relation Age of Onset  . Diabetes Mother   . Diabetes Father     ADVANCED DIRECTIVES (Y/N):  N  HEALTH MAINTENANCE: Social History  Substance Use Topics  . Smoking status: Current Every Day Smoker    Packs/day: 0.25    Types: Cigarettes  . Smokeless  tobacco: Never Used     Comment: quit smoking 03/04/16  . Alcohol use No     Colonoscopy:  PAP:  Bone density:  Lipid panel:  Allergies  Allergen Reactions  . Hydrocodone Itching and Rash  . Codeine Rash  . Oxycodone Itching and Rash    Current Outpatient Prescriptions  Medication Sig Dispense Refill  . acetaminophen (TYLENOL) 500 MG tablet Take 1,000  mg by mouth every 6 (six) hours as needed for moderate pain or headache.    . albuterol (PROVENTIL HFA;VENTOLIN HFA) 108 (90 Base) MCG/ACT inhaler Inhale 2 puffs into the lungs every 6 (six) hours as needed for wheezing or shortness of breath. 1 Inhaler 0  . amLODipine (NORVASC) 2.5 MG tablet Take 1 tablet (2.5 mg total) by mouth daily. 90 tablet 1  . atorvastatin (LIPITOR) 20 MG tablet Take 1 tablet (20 mg total) by mouth at bedtime. 90 tablet 1  . bisacodyl (DULCOLAX) 5 MG EC tablet Take 5 mg by mouth daily as needed for moderate constipation.    . clopidogrel (PLAVIX) 75 MG tablet Take 75 mg by mouth at bedtime.    . cyanocobalamin (,VITAMIN B-12,) 1000 MCG/ML injection Inject 1,000 mcg into the muscle every 30 (thirty) days.    . cyclobenzaprine (FLEXERIL) 10 MG tablet Take 1 tablet (10 mg total) by mouth every 8 (eight) hours as needed for muscle spasms. 90 tablet 1  . furosemide (LASIX) 20 MG tablet Take 20 mg by mouth daily as needed.    Marland Kitchen guaiFENesin 200 MG tablet Take 2 tablets (400 mg total) by mouth daily. 180 tablet 3  . levothyroxine (SYNTHROID, LEVOTHROID) 75 MCG tablet TAKE 1 TABLET EVERY DAY BEFORE BREAKFAST 90 tablet 1  . lidocaine-prilocaine (EMLA) cream Apply to port site 1 hour before port is accessed each time 30 g 3  . LORazepam (ATIVAN) 1 MG tablet TAKE 1 TABLET TWICE DAILY AS NEEDED FOR ANXIETY 30 tablet 2  . mirtazapine (REMERON SOL-TAB) 30 MG disintegrating tablet Take 1 tablet (30 mg total) by mouth at bedtime. 30 tablet 1  . omeprazole (PRILOSEC) 40 MG capsule Take 1 capsule (40 mg total) by mouth daily. 30 capsule 3  . ondansetron (ZOFRAN) 8 MG tablet Take 1 tablet (8 mg total) by mouth 2 (two) times daily as needed for refractory nausea / vomiting. 30 tablet 2  . prochlorperazine (COMPAZINE) 10 MG tablet Take 1 tablet (10 mg total) by mouth every 6 (six) hours as needed (Nausea or vomiting). 30 tablet 1  . traMADol (ULTRAM) 50 MG tablet Take 1 tablet (50 mg total) by  mouth every 6 (six) hours as needed for moderate pain. 30 tablet 2   No current facility-administered medications for this visit.     OBJECTIVE: Vitals:   12/28/16 1545  BP: (!) 159/107  Pulse: 93  Resp: 20  Temp: 97.2 F (36.2 C)     Body mass index is 12.84 kg/m.    ECOG FS:1 - Symptomatic but completely ambulatory  General: Thin, no acute distress. Eyes: Pink conjunctiva, anicteric sclera. Lungs: Clear to auscultation bilaterally. Heart: Regular rate and rhythm. No rubs, murmurs, or gallops. Abdomen: Mildly distended, nontender.. Musculoskeletal: No edema, cyanosis, or clubbing. Neuro: Alert, answering all questions appropriately. Cranial nerves grossly intact. Skin: No rashes or petechiae noted. Psych: Normal affect.   LAB RESULTS:  Lab Results  Component Value Date   NA 136 12/28/2016   K 3.8 12/28/2016   CL 101 12/28/2016   CO2 27 12/28/2016  GLUCOSE 91 12/28/2016   BUN 16 12/28/2016   CREATININE 0.93 12/28/2016   CALCIUM 8.9 12/28/2016   PROT 6.9 12/28/2016   ALBUMIN 3.8 12/28/2016   AST 36 12/28/2016   ALT 24 12/28/2016   ALKPHOS 81 12/28/2016   BILITOT 0.6 12/28/2016   GFRNONAA 58 (L) 12/28/2016   GFRAA >60 12/28/2016    Lab Results  Component Value Date   WBC 12.5 (H) 12/28/2016   NEUTROABS 10.6 (H) 12/28/2016   HGB 13.0 12/28/2016   HCT 38.5 12/28/2016   MCV 96.1 12/28/2016   PLT 215 12/28/2016     STUDIES: Ct Chest W Contrast  Result Date: 12/06/2016 CLINICAL DATA:  Left lung cancer diagnosed in September. On chemotherapy. Cough and intermittent chest pain. Current smoker. EXAM: CT CHEST WITH CONTRAST TECHNIQUE: Multidetector CT imaging of the chest was performed during intravenous contrast administration. CONTRAST:  78mL ISOVUE-300 IOPAMIDOL (ISOVUE-300) INJECTION 61% COMPARISON:  09/08/2016.  PET 07/17/2016. FINDINGS: Cardiovascular: Advanced aortic and branch vessel atherosclerosis. Normal heart size. Multivessel coronary artery  atherosclerosis. No central pulmonary embolism, on this non-dedicated study Mediastinum/Nodes: New right supraclavicular/ low jugular adenopathy at 9 mm on image 16/series infiltrative mediastinal tumor/adenopathy. fills the subcarinal station and AP window regions. Subcarinal component measures 4.0 x 3.3 cm on image 61/series 2.Partially calcified right infrahilar node is newly enlarged 1.8 cm on image 80/series 2. Left infrahilar adenopathy versus central extension of pulmonary metastasis on image 69/series 2.Upper esophageal fluid level on image 28/series 2. Lungs/Pleura: New small left pleural effusion. New soft tissue density within the right middle lobe bronchus and branches, including on image 79/series 3. Left upper lobe nodularity, much of which is within the bronchi, is new, including on image 33/series 3. Redemonstration of scarring in the left lower lobe. New areas of surrounding nodularity. Example medially at 12 mm on image 87/ series 3. Upper Abdomen: Beam hardening artifact from vertebral augmentation. Normal imaged portions of the spleen, liver, stomach, pancreas, adrenal glands, kidneys. Abdominal aortic and branch vessel atherosclerosis. Musculoskeletal: Osteopenia. Vertebral augmentation at T10 through L2. Mild T6 compression deformity is unchanged. IMPRESSION: 1. Findings most consistent with extensive recurrent and progressive disease. Adenopathy throughout the mediastinum, low neck, and within the right infrahilar region. New and increased pulmonary nodularity as detailed above. 2. New small left pleural effusion. 3.  Coronary artery atherosclerosis. Aortic atherosclerosis. 4. Esophageal air fluid level suggests dysmotility or gastroesophageal reflux. These results will be called to the ordering clinician or representative by the Radiologist Assistant, and communication documented in the PACS or zVision Dashboard. Electronically Signed   By: Abigail Miyamoto M.D.   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   hospice and end-of-life were discussed with the patient, which she refused. She wishes to proceed with aggressive treatment despite her decreased performance status. Will not use carboplatinum and etoposide as previous given the toxicity, therefore will proceed with single agent weekly topotecan. Return to clinic on Monday, January 01, 2017 to initiate cycle 1, day 1.  2. Headaches:  Patient does not complain of this today. Previously,MRI negative for metastasis, monitor. 3. Pain: Continue tramadol as needed.  4. Weight loss: Likely secondary to progressive disease. 5. Thrombocytopenia: Resolved. Secondary  chemotherapy, monitor.  6. Leukocytosis: Resolved.  Approximately 30 minutes was spent in discussion of which greater than 50% was consultation.    Patient expressed understanding and was in agreement with this plan. She also understands that She can call clinic at any time with any questions, concerns, or complaints.   Cancer Staging Primary cancer of left lung metastatic to other site [facility]) Staging form: Lung, AJCC 7th Edition - Clinical stage from 03/12/2016: Stage IV (T3, N3, M1a) - Signed by Lloyd Huger, MD on 03/26/2016   Lloyd Huger, MD   12/29/2016 12:38 PM

## 2016-12-27 NOTE — Telephone Encounter (Signed)
OK to call in. Thanks! 

## 2016-12-27 NOTE — Telephone Encounter (Signed)
Called into [facility].

## 2016-12-28 ENCOUNTER — Inpatient Hospital Stay: Payer: Medicare HMO | Admitting: Oncology

## 2016-12-28 ENCOUNTER — Inpatient Hospital Stay: Payer: Medicare HMO | Attending: Oncology | Admitting: Oncology

## 2016-12-28 ENCOUNTER — Inpatient Hospital Stay: Payer: Medicare HMO

## 2016-12-28 VITALS — BP 159/107 | HR 93 | Temp 97.2°F | Resp 20 | Wt 72.5 lb

## 2016-12-28 DIAGNOSIS — I129 Hypertensive chronic kidney disease with stage 1 through stage 4 chronic kidney disease, or unspecified chronic kidney disease: Secondary | ICD-10-CM | POA: Diagnosis not present

## 2016-12-28 DIAGNOSIS — F419 Anxiety disorder, unspecified: Secondary | ICD-10-CM | POA: Insufficient documentation

## 2016-12-28 DIAGNOSIS — N189 Chronic kidney disease, unspecified: Secondary | ICD-10-CM | POA: Diagnosis not present

## 2016-12-28 DIAGNOSIS — C7801 Secondary malignant neoplasm of right lung: Secondary | ICD-10-CM | POA: Insufficient documentation

## 2016-12-28 DIAGNOSIS — J90 Pleural effusion, not elsewhere classified: Secondary | ICD-10-CM | POA: Insufficient documentation

## 2016-12-28 DIAGNOSIS — Z8673 Personal history of transient ischemic attack (TIA), and cerebral infarction without residual deficits: Secondary | ICD-10-CM | POA: Insufficient documentation

## 2016-12-28 DIAGNOSIS — F17210 Nicotine dependence, cigarettes, uncomplicated: Secondary | ICD-10-CM

## 2016-12-28 DIAGNOSIS — R5383 Other fatigue: Secondary | ICD-10-CM | POA: Insufficient documentation

## 2016-12-28 DIAGNOSIS — C3492 Malignant neoplasm of unspecified part of left bronchus or lung: Secondary | ICD-10-CM

## 2016-12-28 DIAGNOSIS — R0602 Shortness of breath: Secondary | ICD-10-CM | POA: Diagnosis not present

## 2016-12-28 DIAGNOSIS — J449 Chronic obstructive pulmonary disease, unspecified: Secondary | ICD-10-CM | POA: Insufficient documentation

## 2016-12-28 DIAGNOSIS — I700 Atherosclerosis of aorta: Secondary | ICD-10-CM | POA: Insufficient documentation

## 2016-12-28 DIAGNOSIS — E039 Hypothyroidism, unspecified: Secondary | ICD-10-CM | POA: Insufficient documentation

## 2016-12-28 DIAGNOSIS — Z8701 Personal history of pneumonia (recurrent): Secondary | ICD-10-CM | POA: Insufficient documentation

## 2016-12-28 DIAGNOSIS — M5030 Other cervical disc degeneration, unspecified cervical region: Secondary | ICD-10-CM | POA: Diagnosis not present

## 2016-12-28 DIAGNOSIS — I2510 Atherosclerotic heart disease of native coronary artery without angina pectoris: Secondary | ICD-10-CM | POA: Insufficient documentation

## 2016-12-28 DIAGNOSIS — R531 Weakness: Secondary | ICD-10-CM | POA: Insufficient documentation

## 2016-12-28 DIAGNOSIS — E538 Deficiency of other specified B group vitamins: Secondary | ICD-10-CM | POA: Insufficient documentation

## 2016-12-28 DIAGNOSIS — M549 Dorsalgia, unspecified: Secondary | ICD-10-CM | POA: Diagnosis not present

## 2016-12-28 DIAGNOSIS — M818 Other osteoporosis without current pathological fracture: Secondary | ICD-10-CM | POA: Insufficient documentation

## 2016-12-28 DIAGNOSIS — F329 Major depressive disorder, single episode, unspecified: Secondary | ICD-10-CM | POA: Diagnosis not present

## 2016-12-28 DIAGNOSIS — R32 Unspecified urinary incontinence: Secondary | ICD-10-CM | POA: Diagnosis not present

## 2016-12-28 DIAGNOSIS — E785 Hyperlipidemia, unspecified: Secondary | ICD-10-CM | POA: Diagnosis not present

## 2016-12-28 DIAGNOSIS — R590 Localized enlarged lymph nodes: Secondary | ICD-10-CM | POA: Diagnosis not present

## 2016-12-28 DIAGNOSIS — R634 Abnormal weight loss: Secondary | ICD-10-CM | POA: Insufficient documentation

## 2016-12-28 DIAGNOSIS — Z95828 Presence of other vascular implants and grafts: Secondary | ICD-10-CM

## 2016-12-28 DIAGNOSIS — Z79899 Other long term (current) drug therapy: Secondary | ICD-10-CM | POA: Insufficient documentation

## 2016-12-28 DIAGNOSIS — R51 Headache: Secondary | ICD-10-CM | POA: Diagnosis not present

## 2016-12-28 DIAGNOSIS — Z85118 Personal history of other malignant neoplasm of bronchus and lung: Secondary | ICD-10-CM | POA: Diagnosis present

## 2016-12-28 LAB — CBC WITH DIFFERENTIAL/PLATELET
BASOS ABS: 0.1 10*3/uL (ref 0–0.1)
BASOS PCT: 1 %
EOS ABS: 0 10*3/uL (ref 0–0.7)
EOS PCT: 0 %
HCT: 38.5 % (ref 35.0–47.0)
Hemoglobin: 13 g/dL (ref 12.0–16.0)
Lymphocytes Relative: 10 %
Lymphs Abs: 1.2 10*3/uL (ref 1.0–3.6)
MCH: 32.3 pg (ref 26.0–34.0)
MCHC: 33.6 g/dL (ref 32.0–36.0)
MCV: 96.1 fL (ref 80.0–100.0)
MONO ABS: 0.5 10*3/uL (ref 0.2–0.9)
Monocytes Relative: 4 %
Neutro Abs: 10.6 10*3/uL — ABNORMAL HIGH (ref 1.4–6.5)
Neutrophils Relative %: 85 %
PLATELETS: 215 10*3/uL (ref 150–440)
RBC: 4.01 MIL/uL (ref 3.80–5.20)
RDW: 14.7 % — AB (ref 11.5–14.5)
WBC: 12.5 10*3/uL — AB (ref 3.6–11.0)

## 2016-12-28 LAB — COMPREHENSIVE METABOLIC PANEL
ALBUMIN: 3.8 g/dL (ref 3.5–5.0)
ALT: 24 U/L (ref 14–54)
AST: 36 U/L (ref 15–41)
Alkaline Phosphatase: 81 U/L (ref 38–126)
Anion gap: 8 (ref 5–15)
BUN: 16 mg/dL (ref 6–20)
CHLORIDE: 101 mmol/L (ref 101–111)
CO2: 27 mmol/L (ref 22–32)
Calcium: 8.9 mg/dL (ref 8.9–10.3)
Creatinine, Ser: 0.93 mg/dL (ref 0.44–1.00)
GFR calc Af Amer: >60 mL/min (ref >60)
GFR calc non Af Amer: 58 mL/min — ABNORMAL LOW (ref >60)
GLUCOSE: 91 mg/dL (ref 65–99)
POTASSIUM: 3.8 mmol/L (ref 3.5–5.1)
SODIUM: 136 mmol/L (ref 135–145)
Total Bilirubin: 0.6 mg/dL (ref 0.3–1.2)
Total Protein: 6.9 g/dL (ref 6.5–8.1)

## 2016-12-28 MED ORDER — HEPARIN SOD (PORK) LOCK FLUSH 100 UNIT/ML IV SOLN
500.0000 [IU] | Freq: Once | INTRAVENOUS | Status: AC
  Administered 2016-12-28: 500 [IU] via INTRAVENOUS

## 2016-12-28 MED ORDER — SODIUM CHLORIDE 0.9% FLUSH
10.0000 mL | INTRAVENOUS | Status: DC | PRN
  Administered 2016-12-28: 10 mL via INTRAVENOUS
  Filled 2016-12-28: qty 10

## 2016-12-28 NOTE — Progress Notes (Signed)
Patient denies any concerns today.  

## 2016-12-29 NOTE — Progress Notes (Signed)
DISCONTINUE ON PATHWAY REGIMEN - Small Cell Lung     A cycle is every 21 days:     Etoposide        Dose Mod: None     Carboplatin        Dose Mod: None  **Always confirm dose/schedule in your pharmacy ordering system**    REASON: Disease Progression PRIOR TREATMENT: [id number]: Etoposide 100 mg/m2 Days 1, 2, 3 + Carboplatin AUC=5 Day 1 q21 Days x 4 Cycles TREATMENT RESPONSE: Progressive Disease (PD)  START ON PATHWAY REGIMEN - Small Cell Lung     Administer weekly:     Topotecan   **Always confirm dose/schedule in your pharmacy ordering system**    Patient Characteristics: Extensive and Limited Stage, Second Line, Relapse 3 - 6 Months Stage Grouping: Extensive AJCC T Category: TX AJCC N Category: NX AJCC M Category: M1a AJCC 8 Stage Grouping: IVA Line of therapy: Second Line Would you be surprised if this patient died  in the next year? I would NOT be surprised if this patient died in the next year Time to Relapse: Relapse 3 - 6 Months Intent of Therapy: Non-Curative / Palliative Intent, Discussed with Patient

## 2016-12-31 NOTE — Progress Notes (Signed)
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  INTERVAL HISTORY: Patient returns to clinic today for further evaluation and initiation of cycle 1 of topotecan. She has worsening congestion and shortness of breath today, but denies any fevers. She continues to lose weight and has a declining performance status. She continues to have weakness and fatigue, but this is relatively unchanged. She denies any chest pain or hemoptysis. She has a fair appetite. She has no nausea, vomiting, constipation, or diarrhea. She has no urinary complaints. Patient offers no further specific complaints.  REVIEW OF SYSTEMS:   Review of Systems  Constitutional: Positive for malaise/fatigue and weight loss. Negative for fever.  HENT: Positive for congestion.   Eyes: Negative.  Negative for blurred vision and double vision.  Respiratory: Positive for cough and shortness of breath. Negative for hemoptysis.   Cardiovascular: Negative for chest pain and leg swelling.  Gastrointestinal: Negative.  Negative for abdominal pain.  Genitourinary: Negative.   Musculoskeletal: Positive for back pain.  Neurological: Positive for weakness. Negative for focal weakness and headaches.  Psychiatric/Behavioral: Negative.  The patient is not nervous/anxious.     As per HPI. Otherwise, a complete review of systems is negative.  PAST MEDICAL HISTORY: Past Medical History:  Diagnosis Date  . Abdominal aneurysm ([place])   . Anxiety   . Cancer of left lung ([place])  03/10/2016   lung  . Chronic kidney disease   . Collagenous colitis   . COPD (chronic obstructive pulmonary disease) ([place])   . DDD (degenerative disc disease), cervical   . Depression   . Hernia of abdominal cavity   . Hyperlipidemia   . Hypertension   . Hypothyroidism   . Osteoporosis   . Pneumonia   . Stroke ([place])   . Tobacco abuse   . Urine incontinence   . Vitamin B 12 deficiency     PAST SURGICAL HISTORY: Past Surgical History:  Procedure Laterality Date  . ABDOMINAL AORTIC ANEURYSM REPAIR    . ABDOMINAL HYSTERECTOMY    . APPENDECTOMY    . BACK SURGERY  [phone]  . CHOLECYSTECTOMY    . FLEXIBLE BRONCHOSCOPY N/A 03/17/2016   Procedure: FLEXIBLE BRONCHOSCOPY;  Surgeon: Wilhelmina Mcardle, MD;  Location: [facility] ORS;  Service: Pulmonary;  Laterality: N/A;  . KYPHOPLASTY N/A 07/13/2016   Procedure: KYPHOPLASTY;  Surgeon: Hessie Knows, MD;  Location: [facility] OR[facility];  Service: Orthopedics;  Laterality: N/A;  . KYPHOPLASTY N/A 08/03/2016   Procedure: KYPHOPLASTY T12, L2;  Surgeon: Hessie Knows, MD;  Location: [facility];  Service: Orthopedics;  Laterality: N/A;  . PERIPHERAL VASCULAR CATHETERIZATION N/A 04/03/2016   Procedure: Glori Luis Cath Insertion;  Surgeon: Algernon Huxley, MD;  Location: [facility];  Service: Cardiovascular;  Laterality: N/A;    FAMILY HISTORY: Family History  Problem Relation Age of Onset  . Diabetes Mother   . Diabetes Father     ADVANCED DIRECTIVES (Y/N):  N  HEALTH MAINTENANCE: Social History  Substance Use Topics  . Smoking status: Current Every Day Smoker    Packs/day: 0.25    Types: Cigarettes  .  Smokeless tobacco: Never Used     Comment: quit smoking 03/04/16  . Alcohol use No     Colonoscopy:  PAP:  Bone density:  Lipid panel:  Allergies  Allergen Reactions  . Hydrocodone Itching and Rash  . Codeine Rash  . Oxycodone Itching and Rash    Current Outpatient Prescriptions  Medication Sig Dispense Refill  . acetaminophen (TYLENOL) 500 MG  tablet Take 1,000 mg by mouth every 6 (six) hours as needed for moderate pain or headache.    . albuterol (PROVENTIL HFA;VENTOLIN HFA) 108 (90 Base) MCG/ACT inhaler Inhale 2 puffs into the lungs every 6 (six) hours as needed for wheezing or shortness of breath. 1 Inhaler 0  . amLODipine (NORVASC) 2.5 MG tablet Take 1 tablet (2.5 mg total) by mouth daily. 90 tablet 1  . atorvastatin (LIPITOR) 20 MG tablet Take 1 tablet (20 mg total) by mouth at bedtime. 90 tablet 1  . bisacodyl (DULCOLAX) 5 MG EC tablet Take 5 mg by mouth daily as needed for moderate constipation.    . clopidogrel (PLAVIX) 75 MG tablet Take 75 mg by mouth at bedtime.    . cyanocobalamin (,VITAMIN B-12,) 1000 MCG/ML injection Inject 1,000 mcg into the muscle every 30 (thirty) days.    . cyclobenzaprine (FLEXERIL) 10 MG tablet Take 1 tablet (10 mg total) by mouth every 8 (eight) hours as needed for muscle spasms. 90 tablet 1  . furosemide (LASIX) 20 MG tablet Take 20 mg by mouth daily as needed.    Marland Kitchen guaiFENesin 200 MG tablet Take 2 tablets (400 mg total) by mouth daily. 180 tablet 3  . levothyroxine (SYNTHROID, LEVOTHROID) 75 MCG tablet TAKE 1 TABLET EVERY DAY BEFORE BREAKFAST 90 tablet 1  . lidocaine-prilocaine (EMLA) cream Apply to port site 1 hour before port is accessed each time 30 g 3  . LORazepam (ATIVAN) 1 MG tablet TAKE 1 TABLET TWICE DAILY AS NEEDED FOR ANXIETY 30 tablet 2  . mirtazapine (REMERON SOL-TAB) 30 MG disintegrating tablet Take 1 tablet (30 mg total) by mouth at bedtime. 30 tablet 1  . omeprazole (PRILOSEC) 40 MG capsule Take 1 capsule (40 mg total) by mouth daily. 30 capsule 3  . ondansetron (ZOFRAN) 8 MG tablet Take 1 tablet (8 mg total) by mouth 2 (two) times daily as needed for refractory nausea / vomiting. 30 tablet 2  . prochlorperazine (COMPAZINE) 10 MG tablet Take 1 tablet (10 mg total) by mouth every 6 (six) hours as needed (Nausea or vomiting). 30 tablet 1  . traMADol (ULTRAM) 50 MG tablet Take 1 tablet  (50 mg total) by mouth every 6 (six) hours as needed for moderate pain. 30 tablet 2   No current facility-administered medications for this visit.    Facility-Administered Medications Ordered in Other Visits  Medication Dose Route Frequency Provider Last Rate Last Dose  . heparin lock flush 100 unit/mL  500 Units Intracatheter Once PRN Lloyd Huger, MD      . sodium chloride flush (NS) 0.9 % injection 10 mL  10 mL Intracatheter PRN Lloyd Huger, MD   10 mL at 01/01/17 1235    OBJECTIVE: Vitals:   01/01/17 1112  BP: 126/88  Pulse: (!) 101  Resp: 20  Temp: (!) 96.8 F (36 C)     Body mass index is 12.68 kg/m.    ECOG FS:1 - Symptomatic but completely ambulatory  General: Thin, no acute distress. Eyes: Pink conjunctiva, anicteric sclera. Lungs: Clear to auscultation bilaterally. Heart: Regular  rate and rhythm. No rubs, murmurs, or gallops. Abdomen: Mildly distended, nontender.. Musculoskeletal: No edema, cyanosis, or clubbing. Neuro: Alert, answering all questions appropriately. Cranial nerves grossly intact. Skin: No rashes or petechiae noted. Psych: Normal affect.   LAB RESULTS:  Lab Results  Component Value Date   NA 136 12/28/2016   K 3.8 12/28/2016   CL 101 12/28/2016   CO2 27 12/28/2016   GLUCOSE 91 12/28/2016   BUN 16 12/28/2016   CREATININE 0.93 12/28/2016   CALCIUM 8.9 12/28/2016   PROT 6.9 12/28/2016   ALBUMIN 3.8 12/28/2016   AST 36 12/28/2016   ALT 24 12/28/2016   ALKPHOS 81 12/28/2016   BILITOT 0.6 12/28/2016   GFRNONAA 58 (L) 12/28/2016   GFRAA >60 12/28/2016    Lab Results  Component Value Date   WBC 12.5 (H) 12/28/2016   NEUTROABS 10.6 (H) 12/28/2016   HGB 13.0 12/28/2016   HCT 38.5 12/28/2016   MCV 96.1 12/28/2016   PLT 215 12/28/2016     STUDIES: Dg Chest 2 View  Result Date: 01/01/2017 CLINICAL DATA:  Known lung carcinoma with cough EXAM: CHEST  2 VIEW COMPARISON:  12/06/2016 FINDINGS: Cardiac shadow is stable. Patient  is rotated to the left accentuating the mediastinal markings. Multiple calcified lymph nodes are noted throughout the hila and mediastinum. Nodular density is noted just below the left hilum consistent with the nodule seen on prior CT examination. Small left pleural effusion and likely left basilar atelectasis is noted. Right chest wall port is again noted. Multilevel vertebral augmentation is seen. Some progression of the previously seen T6 compression deformity is noted. IMPRESSION: New left basilar atelectasis and small left effusion. Left lower lobe nodule.  Stable from previous CT. Changes of prior granulomatous disease. Electronically Signed   By: Inez Catalina M.D.   On: 01/01/2017 11:57   Ct Chest W Contrast  Result Date: 12/06/2016 CLINICAL DATA:  Left lung cancer diagnosed in September. On chemotherapy. Cough and intermittent chest pain. Current smoker. EXAM: CT CHEST WITH CONTRAST TECHNIQUE: Multidetector CT imaging of the chest was performed during intravenous contrast administration. CONTRAST:  9mL ISOVUE-300 IOPAMIDOL (ISOVUE-300) INJECTION 61% COMPARISON:  09/08/2016.  PET 07/17/2016. FINDINGS: Cardiovascular: Advanced aortic and branch vessel atherosclerosis. Normal heart size. Multivessel coronary artery atherosclerosis. No central pulmonary embolism, on this non-dedicated study Mediastinum/Nodes: New right supraclavicular/ low jugular adenopathy at 9 mm on image 16/series infiltrative mediastinal tumor/adenopathy. fills the subcarinal station and AP window regions. Subcarinal component measures 4.0 x 3.3 cm on image 61/series 2.Partially calcified right infrahilar node is newly enlarged 1.8 cm on image 80/series 2. Left infrahilar adenopathy versus central extension of pulmonary metastasis on image 69/series 2.Upper esophageal fluid level on image 28/series 2. Lungs/Pleura: New small left pleural effusion. New soft tissue density within the right middle lobe bronchus and branches, including on  image 79/series 3. Left upper lobe nodularity, much of which is within the bronchi, is new, including on image 33/series 3. Redemonstration of scarring in the left lower lobe. New areas of surrounding nodularity. Example medially at 12 mm on image 87/ series 3. Upper Abdomen: Beam hardening artifact from vertebral augmentation. Normal imaged portions of the spleen, liver, stomach, pancreas, adrenal glands, kidneys. Abdominal aortic and branch vessel atherosclerosis. Musculoskeletal: Osteopenia. Vertebral augmentation at T10 through L2. Mild T6 compression deformity is unchanged. IMPRESSION: 1. Findings most consistent with extensive recurrent and progressive disease. Adenopathy throughout the mediastinum, low neck, and within the right infrahilar region. New and increased pulmonary nodularity as  detailed above. 2. New small left pleural effusion. 3.  Coronary artery atherosclerosis. Aortic atherosclerosis. 4. Esophageal air fluid level suggests dysmotility or gastroesophageal reflux. These results will be called to the ordering clinician or representative by the Radiologist Assistant, and communication documented in the PACS or zVision Dashboard. Electronically Signed   By: Abigail Miyamoto M.D.   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 Hospice and end-of-life were discussed with the patient, which she refused. She wishes to proceed with aggressive treatment despite her decreased performance status. Will not use carboplatinum and etoposide as previous given the toxicity, therefore will proceed with single agent weekly topotecan on days 1, 8, 15 with day 22 off. Proceed with cycle 1, day 1 of weekly single agent dose reduced topotecan. Return to  clinic in 1 week for consideration of cycle 1, day 8. 2. Headaches:  Patient does not complain of this today. Previously,MRI negative for metastasis, monitor. 3. Pain: Continue tramadol as needed.  4. Weight loss: Likely secondary to progressive disease. 5. Thrombocytopenia: Resolved. Secondary chemotherapy, monitor.  6. Leukocytosis: Resolved. 7. Cough/shortness of breath: Chest x-ray from today was unrevealing. Proceed with treatment as above.  Approximately 30 minutes was spent in discussion of which greater than 50% was consultation.    Patient expressed understanding and was in agreement with this plan. She also understands that She can call clinic at any time with any questions, concerns, or complaints.   Cancer Staging Primary cancer of left lung metastatic to other site [facility]) Staging form: Lung, AJCC 7th Edition - Clinical stage from 03/12/2016: Stage IV (T3, N3, M1a) - Signed by Lloyd Huger, MD on 03/26/2016   Lloyd Huger, MD   01/01/2017 1:49 PM

## 2017-01-01 ENCOUNTER — Ambulatory Visit
Admission: RE | Admit: 2017-01-01 | Discharge: 2017-01-01 | Disposition: A | Discharge status: Home / Self Care | Payer: Medicare HMO | Source: Ambulatory Visit | Attending: Oncology | Admitting: Oncology

## 2017-01-01 ENCOUNTER — Inpatient Hospital Stay: Payer: Medicare HMO

## 2017-01-01 ENCOUNTER — Inpatient Hospital Stay: Payer: Medicare HMO | Attending: Oncology | Admitting: Oncology

## 2017-01-01 VITALS — BP 112/76 | HR 96 | Temp 97.1°F | Resp 20

## 2017-01-01 VITALS — BP 126/88 | HR 101 | Temp 96.8°F | Resp 20 | Wt 71.6 lb

## 2017-01-01 DIAGNOSIS — R634 Abnormal weight loss: Secondary | ICD-10-CM | POA: Diagnosis not present

## 2017-01-01 DIAGNOSIS — F329 Major depressive disorder, single episode, unspecified: Secondary | ICD-10-CM | POA: Insufficient documentation

## 2017-01-01 DIAGNOSIS — I129 Hypertensive chronic kidney disease with stage 1 through stage 4 chronic kidney disease, or unspecified chronic kidney disease: Secondary | ICD-10-CM | POA: Insufficient documentation

## 2017-01-01 DIAGNOSIS — R32 Unspecified urinary incontinence: Secondary | ICD-10-CM | POA: Diagnosis not present

## 2017-01-01 DIAGNOSIS — E538 Deficiency of other specified B group vitamins: Secondary | ICD-10-CM | POA: Diagnosis not present

## 2017-01-01 DIAGNOSIS — J449 Chronic obstructive pulmonary disease, unspecified: Secondary | ICD-10-CM | POA: Insufficient documentation

## 2017-01-01 DIAGNOSIS — J90 Pleural effusion, not elsewhere classified: Secondary | ICD-10-CM | POA: Insufficient documentation

## 2017-01-01 DIAGNOSIS — C3492 Malignant neoplasm of unspecified part of left bronchus or lung: Secondary | ICD-10-CM

## 2017-01-01 DIAGNOSIS — C3402 Malignant neoplasm of left main bronchus: Secondary | ICD-10-CM

## 2017-01-01 DIAGNOSIS — R05 Cough: Secondary | ICD-10-CM | POA: Insufficient documentation

## 2017-01-01 DIAGNOSIS — F17210 Nicotine dependence, cigarettes, uncomplicated: Secondary | ICD-10-CM | POA: Diagnosis not present

## 2017-01-01 DIAGNOSIS — J9811 Atelectasis: Secondary | ICD-10-CM

## 2017-01-01 DIAGNOSIS — Z5111 Encounter for antineoplastic chemotherapy: Secondary | ICD-10-CM | POA: Diagnosis not present

## 2017-01-01 DIAGNOSIS — C7801 Secondary malignant neoplasm of right lung: Secondary | ICD-10-CM | POA: Diagnosis not present

## 2017-01-01 DIAGNOSIS — E039 Hypothyroidism, unspecified: Secondary | ICD-10-CM | POA: Insufficient documentation

## 2017-01-01 DIAGNOSIS — N183 Chronic kidney disease, stage 3 (moderate): Secondary | ICD-10-CM | POA: Insufficient documentation

## 2017-01-01 DIAGNOSIS — R911 Solitary pulmonary nodule: Secondary | ICD-10-CM | POA: Insufficient documentation

## 2017-01-01 DIAGNOSIS — M818 Other osteoporosis without current pathological fracture: Secondary | ICD-10-CM

## 2017-01-01 DIAGNOSIS — Z8673 Personal history of transient ischemic attack (TIA), and cerebral infarction without residual deficits: Secondary | ICD-10-CM | POA: Diagnosis not present

## 2017-01-01 DIAGNOSIS — R5383 Other fatigue: Secondary | ICD-10-CM | POA: Diagnosis not present

## 2017-01-01 DIAGNOSIS — Z79899 Other long term (current) drug therapy: Secondary | ICD-10-CM | POA: Diagnosis not present

## 2017-01-01 DIAGNOSIS — E785 Hyperlipidemia, unspecified: Secondary | ICD-10-CM

## 2017-01-01 DIAGNOSIS — R531 Weakness: Secondary | ICD-10-CM | POA: Insufficient documentation

## 2017-01-01 DIAGNOSIS — M5030 Other cervical disc degeneration, unspecified cervical region: Secondary | ICD-10-CM | POA: Insufficient documentation

## 2017-01-01 DIAGNOSIS — I714 Abdominal aortic aneurysm, without rupture: Secondary | ICD-10-CM | POA: Insufficient documentation

## 2017-01-01 DIAGNOSIS — F419 Anxiety disorder, unspecified: Secondary | ICD-10-CM | POA: Insufficient documentation

## 2017-01-01 MED ORDER — PROCHLORPERAZINE MALEATE 10 MG PO TABS
10.0000 mg | ORAL_TABLET | Freq: Once | ORAL | Status: AC
  Administered 2017-01-01: 10 mg via ORAL

## 2017-01-01 MED ORDER — SODIUM CHLORIDE 0.9 % IV SOLN
Freq: Once | INTRAVENOUS | Status: AC
  Administered 2017-01-01: 13:00:00 via INTRAVENOUS
  Filled 2017-01-01: qty 1000

## 2017-01-01 MED ORDER — SODIUM CHLORIDE 0.9% FLUSH
10.0000 mL | INTRAVENOUS | Status: DC | PRN
  Administered 2017-01-01: 10 mL
  Filled 2017-01-01: qty 10

## 2017-01-01 MED ORDER — HEPARIN SOD (PORK) LOCK FLUSH 100 UNIT/ML IV SOLN
500.0000 [IU] | Freq: Once | INTRAVENOUS | Status: AC | PRN
  Administered 2017-01-01: 500 [IU]

## 2017-01-01 MED ORDER — SODIUM CHLORIDE 0.9 % IV SOLN
3.0000 mg/m2 | Freq: Once | INTRAVENOUS | Status: AC
  Administered 2017-01-01: 3.6 mg via INTRAVENOUS
  Filled 2017-01-01: qty 3.6

## 2017-01-01 NOTE — Progress Notes (Signed)
Patient reports weakness and fatigue today.

## 2017-01-02 ENCOUNTER — Encounter: Payer: Self-pay | Admitting: Emergency Medicine

## 2017-01-02 ENCOUNTER — Inpatient Hospital Stay
Admission: EM | Admit: 2017-01-02 | Discharge: 2017-01-05 | DRG: 190 | Disposition: A | Discharge status: Hospice | Payer: Medicare HMO | Attending: Internal Medicine | Admitting: Internal Medicine

## 2017-01-02 ENCOUNTER — Other Ambulatory Visit: Payer: Self-pay

## 2017-01-02 ENCOUNTER — Emergency Department: Payer: Medicare HMO

## 2017-01-02 DIAGNOSIS — E785 Hyperlipidemia, unspecified: Secondary | ICD-10-CM | POA: Diagnosis present

## 2017-01-02 DIAGNOSIS — J441 Chronic obstructive pulmonary disease with (acute) exacerbation: Principal | ICD-10-CM | POA: Diagnosis present

## 2017-01-02 DIAGNOSIS — E039 Hypothyroidism, unspecified: Secondary | ICD-10-CM | POA: Diagnosis present

## 2017-01-02 DIAGNOSIS — R0603 Acute respiratory distress: Secondary | ICD-10-CM | POA: Diagnosis not present

## 2017-01-02 DIAGNOSIS — Z7189 Other specified counseling: Secondary | ICD-10-CM | POA: Diagnosis not present

## 2017-01-02 DIAGNOSIS — E43 Unspecified severe protein-calorie malnutrition: Secondary | ICD-10-CM | POA: Diagnosis not present

## 2017-01-02 DIAGNOSIS — R64 Cachexia: Secondary | ICD-10-CM | POA: Diagnosis present

## 2017-01-02 DIAGNOSIS — Z515 Encounter for palliative care: Secondary | ICD-10-CM | POA: Diagnosis not present

## 2017-01-02 DIAGNOSIS — J9601 Acute respiratory failure with hypoxia: Secondary | ICD-10-CM | POA: Diagnosis not present

## 2017-01-02 DIAGNOSIS — M810 Age-related osteoporosis without current pathological fracture: Secondary | ICD-10-CM | POA: Diagnosis present

## 2017-01-02 DIAGNOSIS — R0600 Dyspnea, unspecified: Secondary | ICD-10-CM | POA: Diagnosis not present

## 2017-01-02 DIAGNOSIS — C7801 Secondary malignant neoplasm of right lung: Secondary | ICD-10-CM | POA: Diagnosis present

## 2017-01-02 DIAGNOSIS — Z66 Do not resuscitate: Secondary | ICD-10-CM | POA: Diagnosis not present

## 2017-01-02 DIAGNOSIS — J9621 Acute and chronic respiratory failure with hypoxia: Secondary | ICD-10-CM | POA: Diagnosis present

## 2017-01-02 DIAGNOSIS — I129 Hypertensive chronic kidney disease with stage 1 through stage 4 chronic kidney disease, or unspecified chronic kidney disease: Secondary | ICD-10-CM | POA: Diagnosis present

## 2017-01-02 DIAGNOSIS — J90 Pleural effusion, not elsewhere classified: Secondary | ICD-10-CM | POA: Diagnosis present

## 2017-01-02 DIAGNOSIS — R059 Cough, unspecified: Secondary | ICD-10-CM

## 2017-01-02 DIAGNOSIS — F17210 Nicotine dependence, cigarettes, uncomplicated: Secondary | ICD-10-CM | POA: Diagnosis present

## 2017-01-02 DIAGNOSIS — R0602 Shortness of breath: Secondary | ICD-10-CM | POA: Diagnosis not present

## 2017-01-02 DIAGNOSIS — Z681 Body mass index (BMI) 19 or less, adult: Secondary | ICD-10-CM

## 2017-01-02 DIAGNOSIS — Z8673 Personal history of transient ischemic attack (TIA), and cerebral infarction without residual deficits: Secondary | ICD-10-CM

## 2017-01-02 DIAGNOSIS — C3490 Malignant neoplasm of unspecified part of unspecified bronchus or lung: Secondary | ICD-10-CM

## 2017-01-02 DIAGNOSIS — Z85118 Personal history of other malignant neoplasm of bronchus and lung: Secondary | ICD-10-CM | POA: Diagnosis not present

## 2017-01-02 DIAGNOSIS — R05 Cough: Secondary | ICD-10-CM

## 2017-01-02 DIAGNOSIS — Z885 Allergy status to narcotic agent status: Secondary | ICD-10-CM | POA: Diagnosis not present

## 2017-01-02 DIAGNOSIS — C3402 Malignant neoplasm of left main bronchus: Secondary | ICD-10-CM | POA: Diagnosis not present

## 2017-01-02 DIAGNOSIS — I10 Essential (primary) hypertension: Secondary | ICD-10-CM | POA: Diagnosis not present

## 2017-01-02 DIAGNOSIS — R531 Weakness: Secondary | ICD-10-CM | POA: Diagnosis not present

## 2017-01-02 DIAGNOSIS — N184 Chronic kidney disease, stage 4 (severe): Secondary | ICD-10-CM | POA: Diagnosis present

## 2017-01-02 LAB — CBC WITH DIFFERENTIAL/PLATELET
BASOS PCT: 0 %
Basophils Absolute: 0 10*3/uL (ref 0–0.1)
Eosinophils Absolute: 0 10*3/uL (ref 0–0.7)
Eosinophils Relative: 0 %
HEMATOCRIT: 43.6 % (ref 35.0–47.0)
HEMOGLOBIN: 14.3 g/dL (ref 12.0–16.0)
LYMPHS PCT: 4 %
Lymphs Abs: 0.5 10*3/uL — ABNORMAL LOW (ref 1.0–3.6)
MCH: 31.4 pg (ref 26.0–34.0)
MCHC: 32.8 g/dL (ref 32.0–36.0)
MCV: 95.6 fL (ref 80.0–100.0)
MONOS PCT: 4 %
Monocytes Absolute: 0.5 10*3/uL (ref 0.2–0.9)
NEUTROS ABS: 10 10*3/uL — AB (ref 1.4–6.5)
NEUTROS PCT: 92 %
Platelets: 222 10*3/uL (ref 150–440)
RBC: 4.56 MIL/uL (ref 3.80–5.20)
RDW: 14.9 % — ABNORMAL HIGH (ref 11.5–14.5)
WBC: 11 10*3/uL (ref 3.6–11.0)

## 2017-01-02 LAB — COMPREHENSIVE METABOLIC PANEL
ALT: 23 U/L (ref 14–54)
AST: 34 U/L (ref 15–41)
Albumin: 4 g/dL (ref 3.5–5.0)
Alkaline Phosphatase: 82 U/L (ref 38–126)
Anion gap: 8 (ref 5–15)
BUN: 20 mg/dL (ref 6–20)
CHLORIDE: 99 mmol/L — AB (ref 101–111)
CO2: 27 mmol/L (ref 22–32)
CREATININE: 0.91 mg/dL (ref 0.44–1.00)
Calcium: 9.1 mg/dL (ref 8.9–10.3)
GFR calc Af Amer: >60 mL/min (ref >60)
GFR, EST NON AFRICAN AMERICAN: 59 mL/min — AB (ref >60)
Glucose, Bld: 110 mg/dL — ABNORMAL HIGH (ref 65–99)
Potassium: 4.3 mmol/L (ref 3.5–5.1)
SODIUM: 134 mmol/L — AB (ref 135–145)
Total Bilirubin: 1.1 mg/dL (ref 0.3–1.2)
Total Protein: 7.2 g/dL (ref 6.5–8.1)

## 2017-01-02 LAB — TROPONIN I: Troponin I: <0.03 ng/mL (ref <0.03)

## 2017-01-02 LAB — GLUCOSE, CAPILLARY: GLUCOSE-CAPILLARY: 205 mg/dL — AB (ref 65–99)

## 2017-01-02 LAB — PROTIME-INR
INR: 0.97
Prothrombin Time: 12.9 seconds (ref 11.4–15.2)

## 2017-01-02 LAB — MRSA PCR SCREENING: MRSA by PCR: NEGATIVE

## 2017-01-02 LAB — LACTIC ACID, PLASMA
LACTIC ACID, VENOUS: 2.5 mmol/L — AB (ref 0.5–1.9)
Lactic Acid, Venous: 1.1 mmol/L (ref 0.5–1.9)

## 2017-01-02 MED ORDER — ALBUTEROL SULFATE (2.5 MG/3ML) 0.083% IN NEBU
5.0000 mg | INHALATION_SOLUTION | Freq: Once | RESPIRATORY_TRACT | Status: AC
  Administered 2017-01-02: 5 mg via RESPIRATORY_TRACT
  Filled 2017-01-02: qty 6

## 2017-01-02 MED ORDER — LEVALBUTEROL HCL 1.25 MG/0.5ML IN NEBU
1.2500 mg | INHALATION_SOLUTION | Freq: Four times a day (QID) | RESPIRATORY_TRACT | Status: DC
  Filled 2017-01-02 (×3): qty 0.5

## 2017-01-02 MED ORDER — ACETAMINOPHEN 650 MG RE SUPP: 650.0000 mg | Freq: Four times a day (QID) | RECTAL | Status: DC | PRN

## 2017-01-02 MED ORDER — IPRATROPIUM-ALBUTEROL 0.5-2.5 (3) MG/3ML IN SOLN
3.0000 mL | Freq: Once | RESPIRATORY_TRACT | Status: AC
  Administered 2017-01-02: 3 mL via RESPIRATORY_TRACT
  Filled 2017-01-02: qty 3

## 2017-01-02 MED ORDER — SODIUM CHLORIDE 0.9 % IV BOLUS (SEPSIS)
500.0000 mL | Freq: Once | INTRAVENOUS | Status: AC
  Administered 2017-01-02: 500 mL via INTRAVENOUS

## 2017-01-02 MED ORDER — CHLORHEXIDINE GLUCONATE 0.12 % MT SOLN
15.0000 mL | Freq: Two times a day (BID) | OROMUCOSAL | Status: DC
  Administered 2017-01-02 – 2017-01-05 (×6): 15 mL via OROMUCOSAL
  Filled 2017-01-02 (×5): qty 15

## 2017-01-02 MED ORDER — KETOROLAC TROMETHAMINE 15 MG/ML IJ SOLN
15.0000 mg | Freq: Four times a day (QID) | INTRAMUSCULAR | Status: DC | PRN
  Administered 2017-01-03 (×2): 15 mg via INTRAVENOUS
  Filled 2017-01-02 (×3): qty 1

## 2017-01-02 MED ORDER — BISACODYL 5 MG PO TBEC
5.0000 mg | DELAYED_RELEASE_TABLET | Freq: Every day | ORAL | Status: DC | PRN
  Administered 2017-01-03: 23:00:00 5 mg via ORAL
  Filled 2017-01-02: qty 1

## 2017-01-02 MED ORDER — ALBUTEROL SULFATE (2.5 MG/3ML) 0.083% IN NEBU
2.5000 mg | INHALATION_SOLUTION | RESPIRATORY_TRACT | Status: DC | PRN
  Administered 2017-01-03: 2.5 mg via RESPIRATORY_TRACT
  Filled 2017-01-02: qty 3

## 2017-01-02 MED ORDER — ORAL CARE MOUTH RINSE
15.0000 mL | Freq: Two times a day (BID) | OROMUCOSAL | Status: DC
  Administered 2017-01-03 – 2017-01-04 (×3): 15 mL via OROMUCOSAL

## 2017-01-02 MED ORDER — METHYLPREDNISOLONE SODIUM SUCC 125 MG IJ SOLR
60.0000 mg | Freq: Four times a day (QID) | INTRAMUSCULAR | Status: DC
  Administered 2017-01-02 – 2017-01-03 (×2): 60 mg via INTRAVENOUS
  Filled 2017-01-02 (×3): qty 2

## 2017-01-02 MED ORDER — ONDANSETRON HCL 4 MG PO TABS: 4.0000 mg | ORAL_TABLET | Freq: Four times a day (QID) | ORAL | Status: DC | PRN

## 2017-01-02 MED ORDER — ACETAMINOPHEN 325 MG PO TABS
650.0000 mg | ORAL_TABLET | Freq: Four times a day (QID) | ORAL | Status: DC | PRN
  Administered 2017-01-04 (×2): 650 mg via ORAL
  Filled 2017-01-02 (×2): qty 2

## 2017-01-02 MED ORDER — DEXTROSE 5 % IV SOLN
500.0000 mg | Freq: Once | INTRAVENOUS | Status: AC
  Administered 2017-01-02: 500 mg via INTRAVENOUS
  Filled 2017-01-02: qty 500

## 2017-01-02 MED ORDER — ONDANSETRON HCL 4 MG/2ML IJ SOLN: 4.0000 mg | Freq: Four times a day (QID) | INTRAMUSCULAR | Status: DC | PRN

## 2017-01-02 MED ORDER — SENNOSIDES-DOCUSATE SODIUM 8.6-50 MG PO TABS: 1.0000 | ORAL_TABLET | Freq: Every evening | ORAL | Status: DC | PRN

## 2017-01-02 MED ORDER — METHYLPREDNISOLONE SODIUM SUCC 125 MG IJ SOLR
125.0000 mg | Freq: Once | INTRAMUSCULAR | Status: AC
  Administered 2017-01-02: 125 mg via INTRAVENOUS
  Filled 2017-01-02: qty 2

## 2017-01-02 MED ORDER — DEXTROSE 5 % IV SOLN
1.0000 g | Freq: Once | INTRAVENOUS | Status: AC
  Administered 2017-01-02: 1 g via INTRAVENOUS
  Filled 2017-01-02: qty 10

## 2017-01-02 MED ORDER — ENOXAPARIN SODIUM 30 MG/0.3ML ~~LOC~~ SOLN
30.0000 mg | SUBCUTANEOUS | Status: DC
  Administered 2017-01-02 – 2017-01-03 (×2): 30 mg via SUBCUTANEOUS
  Filled 2017-01-02 (×3): qty 0.3

## 2017-01-02 NOTE — Consult Note (Signed)
Name: TABOR BARTRAM MRN: [id number] DOB: May 27, 1941    ADMISSION DATE:  01/02/2017  CONSULTATION DATE:  01/02/17  REFERRING MD :  Dr.Chen  CHIEF COMPLAINT:  Shortness of breath  BRIEF PATIENT DESCRIPTION: 76 year old female with stage IV left hilar small cell lung cancer with right lung metastasis and COPD presenting with Acute respiratory failure requiring BiPAP  SIGNIFICANT EVENTS  7/3 Patient admitted to the stepdown unit on BiPAP   STUDIES:  NONE   HISTORY OF PRESENT ILLNESS:  Cinthya Bors is a 76 year old female with known history of COPD,Hypertension,Hyperlipidemia,chronic kidney disease,stroke and lung cancer. Patient is seen by oncologist for her cancer treatment.  Patient has been hospie care but has declined it.  Patient presented to ED on 7/3 with acute shortness of breath,cough and wheezing since yesterday which has been worsening.  She was found to be hypoxic and was placed on BiPAP.She was sent to the SDU for further observation.  PAST MEDICAL HISTORY :   has a past medical history of Abdominal aneurysm ([place]); Anxiety; Cancer of left lung ([place]) (03/10/2016); Chronic kidney disease; Collagenous colitis; COPD (chronic obstructive pulmonary disease) ([place]); DDD (degenerative disc disease), cervical; Depression; Hernia of abdominal cavity; Hyperlipidemia; Hypertension; Hypothyroidism; Osteoporosis; Pneumonia; Stroke [facility]); Tobacco abuse; Urine incontinence; and Vitamin B 12 deficiency.  has a past surgical history that includes Abdominal hysterectomy; Abdominal aortic aneurysm repair; Cholecystectomy; Back surgery (8338,2505); Appendectomy; Flexible bronchoscopy (N/A, 03/17/2016); Cardiac catheterization (N/A, 04/03/2016); Kyphoplasty (N/A, 07/13/2016); and Kyphoplasty (N/A, 08/03/2016). Prior to Admission medications   Medication Sig Start Date End Date Taking? Authorizing Provider  acetaminophen (TYLENOL) 500 MG tablet Take 1,000 mg by mouth every 6 (six) hours as needed for moderate pain  or headache.   Yes [provider]  albuterol (PROVENTIL HFA;VENTOLIN HFA) 108 (90 Base) MCG/ACT inhaler Inhale 2 puffs into the lungs every 6 (six) hours as needed for wheezing or shortness of breath. 12/19/16  Yes Johnson, Megan P, DO  amLODipine (NORVASC) 2.5 MG tablet Take 1 tablet (2.5 mg total) by mouth daily. 10/31/16  Yes Johnson, Megan P, DO  atorvastatin (LIPITOR) 20 MG tablet Take 1 tablet (20 mg total) by mouth at bedtime. 10/31/16  Yes Johnson, Megan P, DO  bisacodyl (DULCOLAX) 5 MG EC tablet Take 5 mg by mouth daily as needed for moderate constipation.   Yes [provider]  clopidogrel (PLAVIX) 75 MG tablet Take 75 mg by mouth at bedtime.   Yes [provider]  cyanocobalamin (,VITAMIN B-12,) 1000 MCG/ML injection Inject 1,000 mcg into the muscle every 30 (thirty) days.   Yes [provider]  cyclobenzaprine (FLEXERIL) 10 MG tablet Take 1 tablet (10 mg total) by mouth every 8 (eight) hours as needed for muscle spasms. 12/19/16  Yes Johnson, Megan P, DO  furosemide (LASIX) 20 MG tablet Take 20 mg by mouth daily as needed.   Yes [provider]  guaiFENesin 200 MG tablet Take 2 tablets (400 mg total) by mouth daily. 12/05/16  Yes Johnson, Megan P, DO  levothyroxine (SYNTHROID, LEVOTHROID) 75 MCG tablet TAKE 1 TABLET EVERY DAY BEFORE BREAKFAST 05/31/16  Yes Volney American, PA-C  lidocaine-prilocaine (EMLA) cream Apply to port site 1 hour before port is accessed each time 04/04/16  Yes Finnegan, Kathlene November, MD  LORazepam (ATIVAN) 1 MG tablet TAKE 1 TABLET TWICE DAILY AS NEEDED FOR ANXIETY 12/27/16  Yes Johnson, Megan P, DO  mirtazapine (REMERON SOL-TAB) 30 MG disintegrating tablet Take 1 tablet (30 mg  total) by mouth at bedtime. 12/05/16  Yes Johnson, Megan P, DO  omeprazole (PRILOSEC) 40 MG capsule Take 1 capsule (40 mg total) by mouth daily. 12/19/16  Yes Johnson, Megan P, DO  ondansetron (ZOFRAN) 8 MG tablet Take 1 tablet (8 mg total) by mouth 2  (two) times daily as needed for refractory nausea / vomiting. 04/04/16  Yes Lloyd Huger, MD  prochlorperazine (COMPAZINE) 10 MG tablet Take 1 tablet (10 mg total) by mouth every 6 (six) hours as needed (Nausea or vomiting). 04/04/16  Yes Lloyd Huger, MD  traMADol (ULTRAM) 50 MG tablet Take 1 tablet (50 mg total) by mouth every 6 (six) hours as needed for moderate pain. 10/24/16  Yes Johnson, Megan P, DO   Allergies  Allergen Reactions  . Hydrocodone Itching and Rash  . Codeine Rash  . Oxycodone Itching and Rash    FAMILY HISTORY:  family history includes Diabetes in her father and mother. SOCIAL HISTORY:  reports that she has been smoking Cigarettes.  She has been smoking about 0.25 packs per day. She has never used smokeless tobacco. She reports that she does not drink alcohol or use drugs.  REVIEW OF SYSTEMS:   Constitutional: Negative for fever, chills, weight loss, malaise/fatigue and diaphoresis.  HENT: Negative for hearing loss, ear pain, nosebleeds, congestion, sore throat, neck pain, tinnitus and ear discharge.   Eyes: Negative for blurred vision, double vision, photophobia, pain, discharge and redness.  Respiratory: Negative for cough, hemoptysis, sputum production, shortness of breath, wheezing and stridor.   Cardiovascular: Negative for chest pain, palpitations, orthopnea, claudication, leg swelling and PND.  Gastrointestinal: Negative for heartburn, nausea, vomiting, abdominal pain, diarrhea, constipation, blood in stool and melena.  Genitourinary: Negative for dysuria, urgency, frequency, hematuria and flank pain.  Musculoskeletal: Negative for myalgias, back pain, joint pain and falls.  Skin: Negative for itching and rash.  Neurological: Negative for dizziness, tingling, tremors, sensory change, speech change, focal weakness, seizures, loss of consciousness, weakness and headaches.  Endo/Heme/Allergies: Negative for environmental allergies and polydipsia. Does  not bruise/bleed easily.  SUBJECTIVE: Unable to obtain as the patient is severely dyspneic and on BiPAP  VITAL SIGNS: Temp:  [97.7 F (36.5 C)-98.5 F (36.9 C)] 97.7 F (36.5 C) (07/03 2145) Pulse Rate:  [101-128] 122 (07/03 2145) Resp:  [27-39] 39 (07/03 2145) BP: (132-178)/(100-119) 132/100 (07/03 2100) SpO2:  [70 %-99 %] 96 % (07/03 2145) Weight:  [71 lb (32.2 kg)-74 lb 11.8 oz (33.9 kg)] 74 lb 11.8 oz (33.9 kg) (07/03 2145)  PHYSICAL EXAMINATION: General:  Elderly, white female, on Bipap Neuro:  Awake, alert and oriented HEENT:  AT,[place],no jvd Cardiovascular:  S1s2,regular,no m/r/g Lungs: coarse throughout, no wheezing, rhonchi noted Abdomen: soft,NT,ND Musculoskeletal:  N o edema, cyanosis Skin: warm,dry and intact   Recent Labs Lab 12/28/16 1600 01/02/17 1723  NA 136 134*  K 3.8 4.3  CL 101 99*  CO2 27 27  BUN 16 20  CREATININE 0.93 0.91  GLUCOSE 91 110*    Recent Labs Lab 12/28/16 1600 01/02/17 1723  HGB 13.0 14.3  HCT 38.5 43.6  WBC 12.5* 11.0  PLT 215 222   Dg Chest 2 View  Result Date: 01/01/2017 CLINICAL DATA:  Known lung carcinoma with cough EXAM: CHEST  2 VIEW COMPARISON:  12/06/2016 FINDINGS: Cardiac shadow is stable. Patient is rotated to the left accentuating the mediastinal markings. Multiple calcified lymph nodes are noted throughout the hila and mediastinum. Nodular density is noted just below the left hilum  consistent with the nodule seen on prior CT examination. Small left pleural effusion and likely left basilar atelectasis is noted. Right chest wall port is again noted. Multilevel vertebral augmentation is seen. Some progression of the previously seen T6 compression deformity is noted. IMPRESSION: New left basilar atelectasis and small left effusion. Left lower lobe nodule.  Stable from previous CT. Changes of prior granulomatous disease. Electronically Signed   By: Inez Catalina M.D.   On: 01/01/2017 11:57   Dg Chest Port 1 View  Result Date:  01/02/2017 CLINICAL DATA:  Shortness of breath and patient with history of lung carcinoma. EXAM: PORTABLE CHEST 1 VIEW COMPARISON:  CT chest 12/06/2016. Plain film of the chest 01/01/2017. FINDINGS: Volume loss in the left chest with left basilar atelectasis and small effusion are again identified. The right lung is hyperexpanded and clear. Heart size is normal. Aortic atherosclerosis is noted. IMPRESSION: No change in volume loss in the left chest this patient with known carcinoma. Small left effusion and basilar atelectasis are seen. Emphysema. Electronically Signed   By: Inge Rise M.D.   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

## 2017-01-02 NOTE — ED Notes (Signed)
Pt pale, diaphoretic, pt condition has worsened since arrival.  MD aware.

## 2017-01-02 NOTE — ED Notes (Signed)
Attempted to call report, RN tied up in a room at this time.  Name and ascom number left for RN to call me back.

## 2017-01-02 NOTE — H&P (Signed)
[facility] NAME: Melody Green    MR#:  [id number]  DATE OF BIRTH:  11-Apr-1941  DATE OF ADMISSION:  01/02/2017  PRIMARY CARE PHYSICIAN: Valerie Roys, DO   REQUESTING/REFERRING PHYSICIAN: Schuyler Amor, MD  CHIEF COMPLAINT:   Chief Complaint  Patient presents with  . Shortness of Breath   Shortness breath and wheezing since yesterday. HISTORY OF PRESENT ILLNESS:  Melody Green  is a 76 y.o. female with a known history of Lung cancer, COPD, hypertension, hyperlipidemia, chronic kidney disease, pneumonia and stroke. The patient is sent to ED from home due to above chief complaint. The patient has a history of metastatic lung cancer and was advised hospice care. She also has a history of COPD but not on home oxygen. The patient is lethargic, unable to provide any information. According to her sons and daughter-in-law, the patient has had cough, shortness breath and wheezing since yesterday, which has been worsening today. She was found hypoxia with oxygen level as 70s percent. She is put on on replacement oxygen and then BiPAP. SAT is about 90s on BiPAP. Chest x-ray didn't show infiltration. PAST MEDICAL HISTORY:   Past Medical History:  Diagnosis Date  . Abdominal aneurysm ([place])   . Anxiety   . Cancer of left lung ([place]) 03/10/2016   lung  . Chronic kidney disease   . Collagenous colitis   . COPD (chronic obstructive pulmonary disease) ([place])   . DDD (degenerative disc disease), cervical   . Depression   . Hernia of abdominal cavity   . Hyperlipidemia   . Hypertension   . Hypothyroidism   . Osteoporosis   . Pneumonia   . Stroke ([place])   . Tobacco abuse   . Urine incontinence   . Vitamin B 12 deficiency     PAST SURGICAL HISTORY:   Past Surgical History:  Procedure Laterality Date  . ABDOMINAL AORTIC ANEURYSM REPAIR    . ABDOMINAL HYSTERECTOMY    . APPENDECTOMY    . BACK SURGERY  [phone]  . CHOLECYSTECTOMY    .  FLEXIBLE BRONCHOSCOPY N/A 03/17/2016   Procedure: FLEXIBLE BRONCHOSCOPY;  Surgeon: Wilhelmina Mcardle, MD;  Location: [facility] ORS;  Service: Pulmonary;  Laterality: N/A;  . KYPHOPLASTY N/A 07/13/2016   Procedure: KYPHOPLASTY;  Surgeon: Hessie Knows, MD;  Location: [facility] ORS;  Service: Orthopedics;  Laterality: N/A;  . KYPHOPLASTY N/A 08/03/2016   Procedure: KYPHOPLASTY T12, L2;  Surgeon: Hessie Knows, MD;  Location: [facility] ORS;  Service: Orthopedics;  Laterality: N/A;  . PERIPHERAL VASCULAR CATHETERIZATION N/A 04/03/2016   Procedure: Glori Luis Cath Insertion;  Surgeon: Algernon Huxley, MD;  Location: [facility];  Service: Cardiovascular;  Laterality: N/A;    SOCIAL HISTORY:   Social History  Substance Use Topics  . Smoking status: Current Every Day Smoker    Packs/day: 0.25    Types: Cigarettes  . Smokeless tobacco: Never Used     Comment: quit smoking 03/04/16  . Alcohol use No    FAMILY HISTORY:   Family History  Problem Relation Age of Onset  . Diabetes Mother   . Diabetes Father     DRUG ALLERGIES:   Allergies  Allergen Reactions  . Hydrocodone Itching and Rash  . Codeine Rash  . Oxycodone Itching and Rash    REVIEW OF SYSTEMS:   Review of Systems  Unable to perform ROS: Critical illness    MEDICATIONS AT HOME:   Prior to  Admission medications   Medication Sig Start Date End Date Taking? Authorizing Provider  acetaminophen (TYLENOL) 500 MG tablet Take 1,000 mg by mouth every 6 (six) hours as needed for moderate pain or headache.   Yes [provider]  albuterol (PROVENTIL HFA;VENTOLIN HFA) 108 (90 Base) MCG/ACT inhaler Inhale 2 puffs into the lungs every 6 (six) hours as needed for wheezing or shortness of breath. 12/19/16  Yes Johnson, Megan P, DO  amLODipine (NORVASC) 2.5 MG tablet Take 1 tablet (2.5 mg total) by mouth daily. 10/31/16  Yes Johnson, Megan P, DO  atorvastatin (LIPITOR) 20 MG tablet Take 1 tablet (20 mg total) by mouth at bedtime. 10/31/16  Yes Johnson,  Megan P, DO  bisacodyl (DULCOLAX) 5 MG EC tablet Take 5 mg by mouth daily as needed for moderate constipation.   Yes [provider]  clopidogrel (PLAVIX) 75 MG tablet Take 75 mg by mouth at bedtime.   Yes [provider]  cyanocobalamin (,VITAMIN B-12,) 1000 MCG/ML injection Inject 1,000 mcg into the muscle every 30 (thirty) days.   Yes [provider]  cyclobenzaprine (FLEXERIL) 10 MG tablet Take 1 tablet (10 mg total) by mouth every 8 (eight) hours as needed for muscle spasms. 12/19/16  Yes Johnson, Megan P, DO  furosemide (LASIX) 20 MG tablet Take 20 mg by mouth daily as needed.   Yes [provider]  guaiFENesin 200 MG tablet Take 2 tablets (400 mg total) by mouth daily. 12/05/16  Yes Johnson, Megan P, DO  levothyroxine (SYNTHROID, LEVOTHROID) 75 MCG tablet TAKE 1 TABLET EVERY DAY BEFORE BREAKFAST 05/31/16  Yes Volney American, PA-C  lidocaine-prilocaine (EMLA) cream Apply to port site 1 hour before port is accessed each time 04/04/16  Yes Finnegan, Kathlene November, MD  LORazepam (ATIVAN) 1 MG tablet TAKE 1 TABLET TWICE DAILY AS NEEDED FOR ANXIETY 12/27/16  Yes Johnson, Megan P, DO  mirtazapine (REMERON SOL-TAB) 30 MG disintegrating tablet Take 1 tablet (30 mg total) by mouth at bedtime. 12/05/16  Yes Johnson, Megan P, DO  omeprazole (PRILOSEC) 40 MG capsule Take 1 capsule (40 mg total) by mouth daily. 12/19/16  Yes Johnson, Megan P, DO  ondansetron (ZOFRAN) 8 MG tablet Take 1 tablet (8 mg total) by mouth 2 (two) times daily as needed for refractory nausea / vomiting. 04/04/16  Yes Lloyd Huger, MD  prochlorperazine (COMPAZINE) 10 MG tablet Take 1 tablet (10 mg total) by mouth every 6 (six) hours as needed (Nausea or vomiting). 04/04/16  Yes Lloyd Huger, MD  traMADol (ULTRAM) 50 MG tablet Take 1 tablet (50 mg total) by mouth every 6 (six) hours as needed for moderate pain. 10/24/16  Yes Johnson, Megan P, DO      VITAL SIGNS:  Blood pressure (!)  163/118, pulse (!) 125, temperature 98.4 F (36.9 C), temperature source Oral, resp. rate (!) 34, height 5\' 3"  (1.6 m), weight 71 lb (32.2 kg), SpO2 (!) 89 %.  PHYSICAL EXAMINATION:  Physical Exam  GENERAL:  76 y.o.-year-old patient lying in the bed On BiPAP  EYES: Pupils equal, round, reactive to light and accommodation. No scleral icterus. Extraocular muscles intact.  HEENT: Head atraumatic, normocephalic.  NECK:  Supple, no jugular venous distention. No thyroid enlargement, no tenderness.  LUNGS: Bilateral wheezing and rhonchi. No use of accessory muscles of respiration.  CARDIOVASCULAR: S1, S2 normal. No murmurs, rubs, or gallops.  ABDOMEN: Soft, nontender, nondistended. Bowel sounds present. No organomegaly or mass.  EXTREMITIES: No pedal edema, cyanosis, or  clubbing.  NEUROLOGIC: Unable to exam..  PSYCHIATRIC: The patient is awake but lethargic. SKIN: No obvious rash, lesion, or ulcer.   LABORATORY PANEL:   CBC  Recent Labs Lab 01/02/17 1723  WBC 11.0  HGB 14.3  HCT 43.6  PLT 222   ------------------------------------------------------------------------------------------------------------------  Chemistries   Recent Labs Lab 01/02/17 1723  NA 134*  K 4.3  CL 99*  CO2 27  GLUCOSE 110*  BUN 20  CREATININE 0.91  CALCIUM 9.1  AST 34  ALT 23  ALKPHOS 82  BILITOT 1.1   ------------------------------------------------------------------------------------------------------------------  Cardiac Enzymes  Recent Labs Lab 01/02/17 1723  TROPONINI <0.03   ------------------------------------------------------------------------------------------------------------------  RADIOLOGY:  Dg Chest 2 View  Result Date: 01/01/2017 CLINICAL DATA:  Known lung carcinoma with cough EXAM: CHEST  2 VIEW COMPARISON:  12/06/2016 FINDINGS: Cardiac shadow is stable. Patient is rotated to the left accentuating the mediastinal markings. Multiple calcified lymph nodes are noted  throughout the hila and mediastinum. Nodular density is noted just below the left hilum consistent with the nodule seen on prior CT examination. Small left pleural effusion and likely left basilar atelectasis is noted. Right chest wall port is again noted. Multilevel vertebral augmentation is seen. Some progression of the previously seen T6 compression deformity is noted. IMPRESSION: New left basilar atelectasis and small left effusion. Left lower lobe nodule.  Stable from previous CT. Changes of prior granulomatous disease. Electronically Signed   By: Inez Catalina M.D.   On: 01/01/2017 11:57   Dg Chest Port 1 View  Result Date: 01/02/2017 CLINICAL DATA:  Shortness of breath and patient with history of lung carcinoma. EXAM: PORTABLE CHEST 1 VIEW COMPARISON:  CT chest 12/06/2016. Plain film of the chest 01/01/2017. FINDINGS: Volume loss in the left chest with left basilar atelectasis and small effusion are again identified. The right lung is hyperexpanded and clear. Heart size is normal. Aortic atherosclerosis is noted. IMPRESSION: No change in volume loss in the left chest this patient with known carcinoma. Small left effusion and basilar atelectasis are seen. Emphysema. Electronically Signed   By: Inge Rise M.D.   On: 01/02/2017 17:48      IMPRESSION AND PLAN:   Acute respiratory failure with hypoxia due to COPD exacerbation and lung cancer. The patient will be admitted to stepdown unit. Continue BiPAP, IV Solu-Medrol, Xopenex every 6 hours. Intensivist consult.  Metastatic lung cancer. The patient has very poor prognosis. I discussed about CODE STATUS with the patient's sons and daughter-in-law. Initially they wanted full code, then they decided to not resuscitation. They want to monitor today and may consider comfort care tomorrow. I will get palliative care consult.  Hypertension. Continue home hypertension medication.  All the records are reviewed and case discussed with ED  provider. Management plans discussed with the patient's 2 sons and they are in agreement.  CODE STATUS: DO NOT RESUSCITATE  TOTAL TIME TAKING CARE OF THIS PATIENT: 66 minutes.    Demetrios Loll M.D on 01/02/2017 at 8:53 PM  Between 7am to 6pm - Pager - [phone]  After 6pm go to [email or web address] - Proofreader  [facility]  Office  [phone]  CC: Primary care physician; Valerie Roys, DO   Note: This dictation was prepared with Dragon dictation along with smaller phrase technology. Any transcriptional errors that result from this process are unintentional.

## 2017-01-02 NOTE — ED Notes (Signed)
Assisted pt to use bedpan, pt became short of breath oxygen saturation dropped to 80% while changing from nebulizer mask to non-rebreather mask. Provided pt with warm blankets oxygen saturation increasing

## 2017-01-02 NOTE — ED Triage Notes (Signed)
Pt in via EMS from home, reports increasing shortness of breath x two days, evaluated in ED yesterday, exam unremarkable and discharged home.  EMS reports room air saturation 70% upon arrival, up to 87% on 6L nasal cannula, up to 96% on NRB mask.  Pt on NRB upon arrival to ED.  Pt with stage 4 lung cancer, currently taking chemo.  Pt does not appear in any acute distress at this time.

## 2017-01-02 NOTE — ED Notes (Signed)
Pt placed on bedpan to urinate.

## 2017-01-02 NOTE — Progress Notes (Signed)
Pharmacy medication dosage adjustment. Patient has been ordered lovenox 40 mg subq daily. Patient is 76 y/o  CrCl 27.7 ml/min  Per renal function will adjust lovenox to 30 mg subq daily.  Ketorolac ordered 15 mg q6h prn -- max dose for this patient population is 60 mg/day. Notified in administration instructions to not administer more than 60 mg/day due to increased risk of GI ulceration. Patient > 73 y/o Wt. < 50 kg  Tobie Lords, PharmD, BCPS Clinical Pharmacist 01/02/2017

## 2017-01-02 NOTE — ED Notes (Addendum)
RT to bedside to place pt on Bipap.

## 2017-01-02 NOTE — ED Notes (Signed)
RT notified to place Bipap.

## 2017-01-02 NOTE — ED Provider Notes (Addendum)
[facility] Emergency Department Provider Note  ____________________________________________   I have reviewed the triage vital signs and the nursing notes.   HISTORY  Chief Complaint Shortness of Breath    HPI SHELAGH Green is a 76 y.o. female With aery unfortunate history of metastatic lung cancer who is been advised that hospice would not be a bad option but would prefer to have "everything done" and accordingly he is getting chemotherapy which she started yesterday. She has a history of COPD, and is not on oxygen. She has been having a wheeze at home. He did not get better with her home albuterol. Denies fever. Feels that she may have to cough but cannot get anything up. Feels generally weak. EMS found her satting 70% on room air. She has no fever with them either. Patient denies chest pain. She denies nausea or vomiting.    Past Medical History:  Diagnosis Date  . Abdominal aneurysm ([place])   . Anxiety   . Cancer of left lung ([place]) 03/10/2016   lung  . Chronic kidney disease   . Collagenous colitis   . COPD (chronic obstructive pulmonary disease) ([place])   . DDD (degenerative disc disease), cervical   . Depression   . Hernia of abdominal cavity   . Hyperlipidemia   . Hypertension   . Hypothyroidism   . Osteoporosis   . Pneumonia   . Stroke ([place])   . Tobacco abuse   . Urine incontinence   . Vitamin B 12 deficiency     Patient Active Problem List   Diagnosis Date Noted  . Adult failure to thrive 10/31/2016  . PVD (peripheral vascular disease) ([place]) 10/10/2016  . Stroke ([place])   . Primary cancer of left lung metastatic to other site ([place]) 03/12/2016  . Tobacco abuse 02/08/2016  . History of small bowel obstruction   . Protein-calorie malnutrition, severe 07/24/2015  . Abdominal pain 06/14/2015  . Hypothyroid 05/03/2015  . Purpura senilis ([place]) 05/03/2015  . Positional vertigo of both ears 05/03/2015  . Depression 12/23/2014  . Hyperlipidemia  12/23/2014  . Benign hypertension with CKD (chronic kidney disease) stage IV ([place]) 12/04/2014  . Dizziness 12/04/2014  . Hyponatremia 12/04/2014    Past Surgical History:  Procedure Laterality Date  . ABDOMINAL AORTIC ANEURYSM REPAIR    . ABDOMINAL HYSTERECTOMY    . APPENDECTOMY    . BACK SURGERY  [phone]  . CHOLECYSTECTOMY    . FLEXIBLE BRONCHOSCOPY N/A 03/17/2016   Procedure: FLEXIBLE BRONCHOSCOPY;  Surgeon: Wilhelmina Mcardle, MD;  Location: [facility] ORS;  Service: Pulmonary;  Laterality: N/A;  . KYPHOPLASTY N/A 07/13/2016   Procedure: KYPHOPLASTY;  Surgeon: Hessie Knows, MD;  Location: [facility] ORS;  Service: Orthopedics;  Laterality: N/A;  . KYPHOPLASTY N/A 08/03/2016   Procedure: KYPHOPLASTY T12, L2;  Surgeon: Hessie Knows, MD;  Location: [facility] ORS;  Service: Orthopedics;  Laterality: N/A;  . PERIPHERAL VASCULAR CATHETERIZATION N/A 04/03/2016   Procedure: Glori Luis Cath Insertion;  Surgeon: Algernon Huxley, MD;  Location: [place] [facility];  Service: Cardiovascular;  Laterality: N/A;    Prior to Admission medications   Medication Sig Start Date End Date Taking? Authorizing Provider  acetaminophen (TYLENOL) 500 MG tablet Take 1,000 mg by mouth every 6 (six) hours as needed for moderate pain or headache.    [provider]  albuterol (PROVENTIL HFA;VENTOLIN HFA) 108 (90 Base) MCG/ACT inhaler Inhale 2 puffs into the lungs every 6 (six) hours as needed for wheezing or shortness of breath.  12/19/16   Johnson, Megan P, DO  amLODipine (NORVASC) 2.5 MG tablet Take 1 tablet (2.5 mg total) by mouth daily. 10/31/16   Johnson, Megan P, DO  atorvastatin (LIPITOR) 20 MG tablet Take 1 tablet (20 mg total) by mouth at bedtime. 10/31/16   Johnson, Megan P, DO  bisacodyl (DULCOLAX) 5 MG EC tablet Take 5 mg by mouth daily as needed for moderate constipation.    [provider]  clopidogrel (PLAVIX) 75 MG tablet Take 75 mg by mouth at bedtime.    [provider]  cyanocobalamin (,VITAMIN B-12,)  1000 MCG/ML injection Inject 1,000 mcg into the muscle every 30 (thirty) days.    [provider]  cyclobenzaprine (FLEXERIL) 10 MG tablet Take 1 tablet (10 mg total) by mouth every 8 (eight) hours as needed for muscle spasms. 12/19/16   Johnson, Megan P, DO  furosemide (LASIX) 20 MG tablet Take 20 mg by mouth daily as needed.    [provider]  guaiFENesin 200 MG tablet Take 2 tablets (400 mg total) by mouth daily. 12/05/16   Park Liter P, DO  levothyroxine (SYNTHROID, LEVOTHROID) 75 MCG tablet TAKE 1 TABLET EVERY DAY BEFORE BREAKFAST 05/31/16   Volney American, PA-C  lidocaine-prilocaine (EMLA) cream Apply to port site 1 hour before port is accessed each time 04/04/16   Lloyd Huger, MD  LORazepam (ATIVAN) 1 MG tablet TAKE 1 TABLET TWICE DAILY AS NEEDED FOR ANXIETY 12/27/16   Johnson, Megan P, DO  mirtazapine (REMERON SOL-TAB) 30 MG disintegrating tablet Take 1 tablet (30 mg total) by mouth at bedtime. 12/05/16   Johnson, Megan P, DO  omeprazole (PRILOSEC) 40 MG capsule Take 1 capsule (40 mg total) by mouth daily. 12/19/16   Johnson, Megan P, DO  ondansetron (ZOFRAN) 8 MG tablet Take 1 tablet (8 mg total) by mouth 2 (two) times daily as needed for refractory nausea / vomiting. 04/04/16   Lloyd Huger, MD  prochlorperazine (COMPAZINE) 10 MG tablet Take 1 tablet (10 mg total) by mouth every 6 (six) hours as needed (Nausea or vomiting). 04/04/16   Lloyd Huger, MD  traMADol (ULTRAM) 50 MG tablet Take 1 tablet (50 mg total) by mouth every 6 (six) hours as needed for moderate pain. 10/24/16   Park Liter P, DO    Allergies Hydrocodone; Codeine; and Oxycodone  Family History  Problem Relation Age of Onset  . Diabetes Mother   . Diabetes Father     Social History Social History  Substance Use Topics  . Smoking status: Current Every Day Smoker    Packs/day: 0.25    Types: Cigarettes  . Smokeless tobacco: Never Used     Comment: quit smoking 03/04/16   . Alcohol use No    Review of Systems Constitutional: No fever/chills Eyes: No visual changes. ENT: No sore throat. No stiff neck no neck pain Cardiovascular: Denies chest pain. Respiratory: positive shortnessof breath. Gastrointestinal:   no vomiting.  No diarrhea.  No constipation. Genitourinary: Negative for dysuria. Musculoskeletal: Negative lower extremity swelling Skin: Negative for rash. Neurological: Negative for severe headaches, focal weakness or numbness.   ____________________________________________   PHYSICAL EXAM:  VITAL SIGNS: ED Triage Vitals  Enc Vitals Group     BP 01/02/17 1721 (!) 173/118     Pulse Rate 01/02/17 1721 (!) 102     Resp 01/02/17 1721 (!) 29     Temp 01/02/17 1721 98.5 F (36.9 C)     Temp Source 01/02/17 1721 Oral  SpO2 01/02/17 1717 (!) 70 %     Weight 01/02/17 1721 71 lb (32.2 kg)     Height 01/02/17 1721 5\' 3"  (1.6 m)     Head Circumference --      Peak Flow --      Pain Score 01/02/17 1720 4     Pain Loc --      Pain Edu? --      Excl. in [place]? --     Constitutional: Alert and oriented. Frail, chronically ill appearing.  Eyes: Conjunctivae are normal Head: Atraumatic HEENT: No congestion/rhinnorhea. Mucous membranes are moist.  Oropharynx non-erythematous Neck:   Nontender with no meningismus, no masses, no stridor Cardiovascular: Normal rate, regular rhythm. Grossly normal heart sounds.  Good peripheral circulation. Respiratory: moderately increased wob. Diffuse mild wheeze, diminished in the bases. Some large airway sounding ronchi which pt has trouble coughing up. Weak cough.  Abdominal: Soft and nontender. No distention. No guarding no rebound Back:  There is no focal tenderness or step off.  there is no midline tenderness there are no lesions noted. there is no CVA tenderness Musculoskeletal: No lower extremity tenderness, no upper extremity tenderness. No joint effusions, no DVT signs strong distal pulses no  edema Neurologic:  Normal speech and language. No gross focal neurologic deficits are appreciated.  Skin:  Skin is warm, dry and intact. No rash noted. Psychiatric: Mood and affect are normal. Speech and behavior are normal.  ____________________________________________   LABS (all labs ordered are listed, but only abnormal results are displayed)  Labs Reviewed  CBC WITH DIFFERENTIAL/PLATELET - Abnormal; Notable for the following:       Result Value   RDW 14.9 (*)    Neutro Abs 10.0 (*)    Lymphs Abs 0.5 (*)    All other components within normal limits  CULTURE, BLOOD (ROUTINE X 2)  CULTURE, BLOOD (ROUTINE X 2)  LACTIC ACID, PLASMA  PROTIME-INR  LACTIC ACID, PLASMA  COMPREHENSIVE METABOLIC PANEL  TROPONIN I   ____________________________________________  EKG  I personally interpreted any EKGs ordered by me or triage Sinus rate mild tachycardia 103, LAD noted, old anterior infarct, nonspecific ST changes the lateral leads noted. ____________________________________________  [id number]  I reviewed any imaging ordered by me or triage that were performed during my shift and, if possible, patient and/or family made aware of any abnormal findings. ____________________________________________   PROCEDURES  Procedure(s) performed: None  Procedures  Critical Care performed: None  ____________________________________________   INITIAL IMPRESSION / ASSESSMENT AND PLAN / ED COURSE  Pertinent labs & imaging results that were available during my care of the patient were reviewed by me and considered in my medical decision making (see chart for details).  Unfortunate cancer patient with shortness of breath. Obvious wheeze and rhonchi are noted where giving her steroids, breathing treatments, and low suspicion for PE given exam with wheezes and rhonchi. Blood pressures are elevated the patient is somewhat anxious. We are giving her treatments, blood work is pending, chest x-ray  shows no change in chronic. Patient does appear to be somewhat weakened and is not producing a good cough which I think is certainly contributing to this. Her oxygen saturation is very reassuring on the oxygen we have her on at this time which is nonrebreather. Intubation is not acutely indicated I don't believe. We will try to avoid that if possible. We'll discuss with her oncologist as soon as her blood work comes back which is still pending at this time.  -----------------------------------------  7:35 PM on 01/02/2017 -----------------------------------------  No fever no pneumonia on chest x-ray, however, patient does have a great deal of difficulty clearing her secretions when I asked her to cough she gives a very weak cough. I think this is more due to generalized weakness. Low  suspicion for PE. Patient certainly has a risk for it but she clearly has cough and rhonchi and wheeze. I will try BiPAP to see if that helps. In addition, we will admit to the hospitalist. I did discuss with the hospitalist physician and they agree with management. I also discussed with Dr. Janese Banks, who is on-call for her oncologist they also agree with this management.  ----------------------------------------- 8:01 PM on 01/02/2017 -----------------------------------------  Patient got up to go to the bedpan with oxygen on a desatted to the 70s, I placed her on BiPAP after that. I have had an extensive discussion with family. She is not stable up to go to CT, I will treat her empirically for possible infection just give her every chance of success. Family are considering whether they would wish to pursue intubation at this time if needed. Patient has metastatic disease and has a very poor prognosis, she is very very weak, and at this time they do not wish intubation. They will talk, the son will talk to his brother and they will keep me appraised of the decision-making process. Patient is very weak and cannot have a  discussion at this moment. She is doing better on BiPAP, sats are in the low 90s and she is gradually coming up will get an ABG.    ____________________________________________   FINAL CLINICAL IMPRESSION(S) / ED DIAGNOSES  Final diagnoses:  Cough      This chart was dictated using voice recognition software.  Despite best efforts to proofread,  errors can occur which can change meaning.      Schuyler Amor, MD 01/02/17 1913    Schuyler Amor, MD 01/02/17 1936    Schuyler Amor, MD 01/02/17 2002    Schuyler Amor, MD 01/03/17 [phone]

## 2017-01-03 DIAGNOSIS — J9621 Acute and chronic respiratory failure with hypoxia: Secondary | ICD-10-CM

## 2017-01-03 DIAGNOSIS — Z66 Do not resuscitate: Secondary | ICD-10-CM

## 2017-01-03 DIAGNOSIS — R0600 Dyspnea, unspecified: Secondary | ICD-10-CM

## 2017-01-03 DIAGNOSIS — R05 Cough: Secondary | ICD-10-CM

## 2017-01-03 DIAGNOSIS — Z7189 Other specified counseling: Secondary | ICD-10-CM

## 2017-01-03 DIAGNOSIS — R0603 Acute respiratory distress: Secondary | ICD-10-CM

## 2017-01-03 DIAGNOSIS — R0602 Shortness of breath: Secondary | ICD-10-CM

## 2017-01-03 DIAGNOSIS — C3490 Malignant neoplasm of unspecified part of unspecified bronchus or lung: Secondary | ICD-10-CM

## 2017-01-03 DIAGNOSIS — Z515 Encounter for palliative care: Secondary | ICD-10-CM

## 2017-01-03 DIAGNOSIS — J441 Chronic obstructive pulmonary disease with (acute) exacerbation: Principal | ICD-10-CM

## 2017-01-03 LAB — CBC
HEMATOCRIT: 37.8 % (ref 35.0–47.0)
Hemoglobin: 12.6 g/dL (ref 12.0–16.0)
MCH: 31.9 pg (ref 26.0–34.0)
MCHC: 33.3 g/dL (ref 32.0–36.0)
MCV: 95.8 fL (ref 80.0–100.0)
Platelets: 198 10*3/uL (ref 150–440)
RBC: 3.95 MIL/uL (ref 3.80–5.20)
RDW: 14.5 % (ref 11.5–14.5)
WBC: 14.2 10*3/uL — ABNORMAL HIGH (ref 3.6–11.0)

## 2017-01-03 LAB — BASIC METABOLIC PANEL
Anion gap: 12 (ref 5–15)
BUN: 24 mg/dL — AB (ref 6–20)
CHLORIDE: 102 mmol/L (ref 101–111)
CO2: 23 mmol/L (ref 22–32)
Calcium: 8.6 mg/dL — ABNORMAL LOW (ref 8.9–10.3)
Creatinine, Ser: 1.03 mg/dL — ABNORMAL HIGH (ref 0.44–1.00)
GFR calc Af Amer: 59 mL/min — ABNORMAL LOW (ref >60)
GFR calc non Af Amer: 51 mL/min — ABNORMAL LOW (ref >60)
GLUCOSE: 200 mg/dL — AB (ref 65–99)
POTASSIUM: 3.9 mmol/L (ref 3.5–5.1)
Sodium: 137 mmol/L (ref 135–145)

## 2017-01-03 MED ORDER — ADULT MULTIVITAMIN W/MINERALS CH
1.0000 | ORAL_TABLET | Freq: Every day | ORAL | Status: DC
  Administered 2017-01-03 – 2017-01-05 (×3): 1 via ORAL
  Filled 2017-01-03 (×3): qty 1

## 2017-01-03 MED ORDER — IPRATROPIUM-ALBUTEROL 0.5-2.5 (3) MG/3ML IN SOLN
3.0000 mL | Freq: Four times a day (QID) | RESPIRATORY_TRACT | Status: DC
  Administered 2017-01-03 – 2017-01-05 (×7): 3 mL via RESPIRATORY_TRACT
  Filled 2017-01-03 (×8): qty 3

## 2017-01-03 MED ORDER — DEXTROSE 5 % IV SOLN
1.0000 g | INTRAVENOUS | Status: DC
  Administered 2017-01-03 – 2017-01-05 (×3): 1 g via INTRAVENOUS
  Filled 2017-01-03 (×4): qty 10

## 2017-01-03 MED ORDER — ENSURE ENLIVE PO LIQD
237.0000 mL | Freq: Two times a day (BID) | ORAL | Status: DC
  Administered 2017-01-03 – 2017-01-05 (×4): 237 mL via ORAL

## 2017-01-03 MED ORDER — METHYLPREDNISOLONE SODIUM SUCC 40 MG IJ SOLR
40.0000 mg | Freq: Two times a day (BID) | INTRAMUSCULAR | Status: DC
  Administered 2017-01-03 – 2017-01-04 (×2): 40 mg via INTRAVENOUS
  Filled 2017-01-03 (×2): qty 1

## 2017-01-03 MED ORDER — LEVALBUTEROL HCL 1.25 MG/0.5ML IN NEBU
1.2500 mg | INHALATION_SOLUTION | Freq: Four times a day (QID) | RESPIRATORY_TRACT | Status: DC
  Administered 2017-01-03: 1.25 mg via RESPIRATORY_TRACT
  Filled 2017-01-03 (×2): qty 0.5

## 2017-01-03 MED ORDER — LACTATED RINGERS IV SOLN
INTRAVENOUS | Status: DC
  Administered 2017-01-03 – 2017-01-05 (×4): via INTRAVENOUS

## 2017-01-03 MED ORDER — PANTOPRAZOLE SODIUM 40 MG IV SOLR
40.0000 mg | INTRAVENOUS | Status: DC
  Administered 2017-01-03: 40 mg via INTRAVENOUS
  Filled 2017-01-03: qty 40

## 2017-01-03 NOTE — Progress Notes (Signed)
RN notified [place], NP that patient had 12 beats of SVT, no new orders.

## 2017-01-03 NOTE — Progress Notes (Signed)
9 beat run of V-Tach.  Bin[place], NP made aware.  No new orders received.  Will continue to monitor.

## 2017-01-03 NOTE — Progress Notes (Signed)
No new complaints Reasonably comfortable on [place] O2 - 4 LPM [place]  Vitals:   01/03/17 0700 01/03/17 0809 01/03/17 0900 01/03/17 1000  BP:  127/87    Pulse: 99 (!) 102 (!) 103 (!) 108  Resp: 20 (!) 26 (!) 25 (!) 27  Temp:  97.9 F (36.6 C)    TempSrc:  Axillary    SpO2: 99% 98% 94% 94%  Weight:      Height:        Extremely frail, cachectic Rattling cough HEENT WNL No JVD Diminished BS on L Scattered B rhonchi Reg, no M Abd soft, NT No edema No focal neuro deficits  BMP Latest Ref Rng & Units 01/03/2017 01/02/2017 12/28/2016  Glucose 65 - 99 mg/dL 200(H) 110(H) 91  BUN 6 - 20 mg/dL 24(H) 20 16  Creatinine 0.44 - 1.00 mg/dL 1.03(H) 0.91 0.93  BUN/Creat Ratio 12 - 28 - - -  Sodium 135 - 145 mmol/L 137 134(L) 136  Potassium 3.5 - 5.1 mmol/L 3.9 4.3 3.8  Chloride 101 - 111 mmol/L 102 99(L) 101  CO2 22 - 32 mmol/L 23 27 27   Calcium 8.9 - 10.3 mg/dL 8.6(L) 9.1 8.9   CBC Latest Ref Rng & Units 01/03/2017 01/02/2017 12/28/2016  WBC 3.6 - 11.0 K/uL 14.2(H) 11.0 12.5(H)  Hemoglobin 12.0 - 16.0 g/dL 12.6 14.3 13.0  Hematocrit 35.0 - 47.0 % 37.8 43.6 38.5  Platelets 150 - 440 K/uL 198 222 215     CXR 07/03:  Volume loss on L. 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

## 2017-01-03 NOTE — Progress Notes (Signed)
Initial Nutrition Assessment  DOCUMENTATION CODES:   Severe malnutrition in context of chronic illness  INTERVENTION:   Pending goals of care, pt may benefit from PEG tube if unable to meet estimated needs  DYS 2/thin liquid diet per SLP- Pt unable to swallow any meats or bread  Ensure Enlive po TID, each supplement provides 350 kcal and 20 grams of protein  Mighty Shake II TID with meals, each supplement provides 480-500 kcals and 20-23 grams of protein  Magic cup TID with meals, each supplement provides 290 kcal and 9 grams of protein  MVI  Snacks  NUTRITION DIAGNOSIS:   Malnutrition (severe) related to cancer and cancer related treatments (COPD) as evidenced by severe depletion of muscle mass, severe depletion of body fat, energy intake < or equal to 75% for > or equal to 1 month.  GOAL:   Patient will meet greater than or equal to 90% of their needs  MONITOR:   PO intake, Supplement acceptance, Labs, Weight trends  REASON FOR ASSESSMENT:   Other (Comment) (low BMI)    ASSESSMENT:   76 year old female with known history of COPD,Hypertension,Hyperlipidemia,chronic kidney disease,stroke and lung cancer. Patient is seen by oncologist for her cancer treatment.  Patient has been hospie care but has declined it.  Patient presented to ED on 7/3 with acute shortness of breath,cough and wheezing since yesterday which has been worsening.  She was found to be hypoxic and was placed on BiPAP   Met with pt in room today; pt coughing up mucous at time of visit. Pt reports poor appetite and oral intake for several months pta and today. Pt ate <25% of her breakfast today and drank 3/4 of an Ensure. Pt is severely malnourished. Pt reports trouble swallowing and feels like food gets stuck in her throat. Spoke to SLP today, pt approved for DYS 2/thin liquid diet and needs to sit up straight when eating and drinking. Pt is unable to tolerate any meats or breads and will not have these items  put on her trays. RD will order supplements and snacks to help pt meet estimated protein needs. Please encourage intake of meals and supplements as pt will need this protein. Per chart, pt has lost 5lbs(6%) in 6 months which is not significant for time; however, given history this is concerning as pt does not need to loose any more weight. If pt unable to meet estimated needs, pt may benefit from PEG tube pending goals of care.    Medications reviewed and include: lovenox, solu-medrol, ceftriaxone   Labs reviewed: BUN 24(H), creat 1.03(H), Ca 8.6(L) Wbc- 14.2(H)  Nutrition-Focused physical exam completed. Findings are severe fat and muscle depletions over entire body, and no edema.   Diet Order:  DIET DYS 2 Room service appropriate? Yes with Assist; Fluid consistency: Thin  Skin:  Reviewed, no issues  Last BM:  none since admit  Height:   Ht Readings from Last 1 Encounters:  01/02/17 5' 3"  (1.6 m)    Weight:   Wt Readings from Last 1 Encounters:  01/02/17 74 lb 11.8 oz (33.9 kg)    Ideal Body Weight:  52.3 kg  BMI:  Body mass index is 13.24 kg/m.  Estimated Nutritional Needs:   Kcal:  1200-1400kcal/day   Protein:  67-84g/day   Fluid:  >1.2L/day   EDUCATION NEEDS:   No education needs identified at this time  Koleen Distance MS, RD, [place] Pager #[phone] After Hours Pager: [phone]

## 2017-01-03 NOTE — Progress Notes (Signed)
Transferred to 1-C.  Report called to receiving nurse.  Pt left floor via bed with all personal belongings in tow.

## 2017-01-03 NOTE — Progress Notes (Signed)
Patient given azithromycin/ceftriaxone in ED, but not continued on unit. Patient is DNR and palliative care consulted. Confirmed w/ NP to not continue antibiotics at this time for COPD exacerbation.  Tobie Lords, PharmD, BCPS Clinical Pharmacist 01/03/2017

## 2017-01-03 NOTE — Progress Notes (Signed)
[place] at [facility]                                                                                                                                                                                  Patient Demographics   Melody Green, is a 76 y.o. female, DOB - [birth year], ZOX:[id number]  Admit date - 01/02/2017   Admitting Physician Demetrios Loll, MD  Outpatient Primary MD for the patient is Park Liter P, DO   LOS - 1  Subjective: Patient admitted with acute respiratory failure currently on BiPAP, she wants  to eat    Review of Systems:   CONSTITUTIONAL: No documented fever. No fatigue, weakness. No weight gain, no weight loss.  EYES: No blurry or double vision.  ENT: No tinnitus. No postnasal drip. No redness of the oropharynx.  RESPIRATORY: Positive shortness of breath CARDIOVASCULAR: No chest pain. No orthopnea. No palpitations. No syncope.  GASTROINTESTINAL: No nausea, no vomiting or diarrhea. No abdominal pain. No melena or hematochezia.  GENITOURINARY: No dysuria or hematuria.  ENDOCRINE: No polyuria or nocturia. No heat or cold intolerance.  HEMATOLOGY: No anemia. No bruising. No bleeding.  INTEGUMENTARY: No rashes. No lesions.  MUSCULOSKELETAL: No arthritis. No swelling. No gout.  NEUROLOGIC: No numbness, tingling, or ataxia. No seizure-type activity.  PSYCHIATRIC: No anxiety. No insomnia. No ADD.    Vitals:   Vitals:   01/03/17 0900 01/03/17 1000 01/03/17 1009 01/03/17 1210  BP:   115/71 112/75  Pulse: (!) 103 (!) 108 (!) 104 (!) 105  Resp: (!) 25 (!) 27 (!) 26 20  Temp:      TempSrc:      SpO2: 94% 94% 96% 96%  Weight:      Height:        Wt Readings from Last 3 Encounters:  01/02/17 74 lb 11.8 oz (33.9 kg)  01/01/17 71 lb 9.6 oz (32.5 kg)  12/28/16 72 lb 8 oz (32.9 kg)     Intake/Output Summary (Last 24 hours) at 01/03/17 1322 Last data filed at 01/03/17 8119  Gross per 24 hour  Intake              550 ml  Output                 0 ml  Net              550 ml    Physical Exam:   GENERAL:Critically ill-appearing HEAD, EYES, EARS, NOSE AND THROAT: Atraumatic, normocephalic. Extraocular muscles are intact. Pupils equal and reactive to light. Sclerae anicteric. No conjunctival injection. No oro-pharyngeal erythema.  NECK: Supple. There is no jugular venous distention. No  bruits, no lymphadenopathy, no thyromegaly.  HEART: Regular rate and rhythm,. No murmurs, no rubs, no clicks.  LUNGS: Decreased breath soundsare a muscle usage.  ABDOMEN: Soft, flat, nontender, nondistended. Has good bowel sounds. No hepatosplenomegaly appreciated.  EXTREMITIES: No evidence of any cyanosis, clubbing, or peripheral edema.  +2 pedal and radial pulses bilaterally.  NEUROLOGIC: The patient is alert, awake, and oriented x3 with no focal motor or sensory deficits appreciated bilaterally.  SKIN: Moist and warm with no rashes appreciated.  Psych: Not anxious, depressed LN: No inguinal LN enlargement    Antibiotics   Anti-infectives    Start     Dose/Rate Route Frequency Ordered Stop   01/03/17 1000  cefTRIAXone (ROCEPHIN) 1 g in dextrose 5 % 50 mL IVPB     1 g 100 mL/hr over 30 Minutes Intravenous Every 24 hours 01/03/17 0834     01/02/17 2015  cefTRIAXone (ROCEPHIN) 1 g in dextrose 5 % 50 mL IVPB     1 g 100 mL/hr over 30 Minutes Intravenous  Once 01/02/17 2003 01/02/17 2120   01/02/17 2015  azithromycin (ZITHROMAX) 500 mg in dextrose 5 % 250 mL IVPB     500 mg 250 mL/hr over 60 Minutes Intravenous  Once 01/02/17 2003 01/02/17 2215      Medications   Scheduled Meds: . chlorhexidine  15 mL Mouth Rinse BID  . enoxaparin (LOVENOX) injection  30 mg Subcutaneous Q24H  . feeding supplement (ENSURE ENLIVE)  237 mL Oral BID BM  . ipratropium-albuterol  3 mL Nebulization Q6H  . mouth rinse  15 mL Mouth Rinse q12n4p  . methylPREDNISolone (SOLU-MEDROL) injection  40 mg Intravenous Q12H  . multivitamin with minerals  1 tablet Oral  Daily   Continuous Infusions: . cefTRIAXone (ROCEPHIN)  IV Stopped (01/03/17 0952)   PRN Meds:.acetaminophen **OR** acetaminophen, albuterol, bisacodyl, ketorolac, [DISCONTINUED] ondansetron **OR** ondansetron (ZOFRAN) IV, senna-docusate   Data Review:   Micro Results Recent Results (from the past 240 hour(s))  Culture, blood (routine x 2)     Status: None (Preliminary result)   Collection Time: 01/02/17  5:24 PM  Result Value Ref Range Status   Specimen Description RIGHT ANTECUBITAL  Final   Special Requests   Final    BOTTLES DRAWN AEROBIC AND ANAEROBIC Blood Culture adequate volume   Culture NO GROWTH < 12 HOURS  Final   Report Status PENDING  Incomplete  Culture, blood (routine x 2)     Status: None (Preliminary result)   Collection Time: 01/02/17  5:24 PM  Result Value Ref Range Status   Specimen Description BLOOD RIGHT FOREARM  Final   Special Requests   Final    BOTTLES DRAWN AEROBIC AND ANAEROBIC Blood Culture adequate volume   Culture NO GROWTH < 12 HOURS  Final   Report Status PENDING  Incomplete  MRSA PCR Screening     Status: None   Collection Time: 01/02/17  9:40 PM  Result Value Ref Range Status   MRSA by PCR NEGATIVE NEGATIVE Final    Comment:        The GeneXpert MRSA Assay (FDA approved for NASAL specimens only), is one component of a comprehensive MRSA colonization surveillance program. It is not intended to diagnose MRSA infection nor to guide or monitor treatment for MRSA infections.     Radiology Reports Dg Chest 2 View  Result Date: 01/01/2017 CLINICAL DATA:  Known lung carcinoma with cough EXAM: CHEST  2 VIEW COMPARISON:  12/06/2016 FINDINGS: Cardiac shadow is stable. Patient  is rotated to the left accentuating the mediastinal markings. Multiple calcified lymph nodes are noted throughout the hila and mediastinum. Nodular density is noted just below the left hilum consistent with the nodule seen on prior CT examination. Small left pleural effusion  and likely left basilar atelectasis is noted. Right chest wall port is again noted. Multilevel vertebral augmentation is seen. Some progression of the previously seen T6 compression deformity is noted. IMPRESSION: New left basilar atelectasis and small left effusion. Left lower lobe nodule.  Stable from previous CT. Changes of prior granulomatous disease. Electronically Signed   By: Inez Catalina M.D.   On: 01/01/2017 11:57   Ct Chest W Contrast  Result Date: 12/06/2016 CLINICAL DATA:  Left lung cancer diagnosed in September. On chemotherapy. Cough and intermittent chest pain. Current smoker. EXAM: CT CHEST WITH CONTRAST TECHNIQUE: Multidetector CT imaging of the chest was performed during intravenous contrast administration. CONTRAST:  62mL ISOVUE-300 IOPAMIDOL (ISOVUE-300) INJECTION 61% COMPARISON:  09/08/2016.  PET 07/17/2016. FINDINGS: Cardiovascular: Advanced aortic and branch vessel atherosclerosis. Normal heart size. Multivessel coronary artery atherosclerosis. No central pulmonary embolism, on this non-dedicated study Mediastinum/Nodes: New right supraclavicular/ low jugular adenopathy at 9 mm on image 16/series infiltrative mediastinal tumor/adenopathy. fills the subcarinal station and AP window regions. Subcarinal component measures 4.0 x 3.3 cm on image 61/series 2.Partially calcified right infrahilar node is newly enlarged 1.8 cm on image 80/series 2. Left infrahilar adenopathy versus central extension of pulmonary metastasis on image 69/series 2.Upper esophageal fluid level on image 28/series 2. Lungs/Pleura: New small left pleural effusion. New soft tissue density within the right middle lobe bronchus and branches, including on image 79/series 3. Left upper lobe nodularity, much of which is within the bronchi, is new, including on image 33/series 3. Redemonstration of scarring in the left lower lobe. New areas of surrounding nodularity. Example medially at 12 mm on image 87/ series 3. Upper Abdomen:  Beam hardening artifact from vertebral augmentation. Normal imaged portions of the spleen, liver, stomach, pancreas, adrenal glands, kidneys. Abdominal aortic and branch vessel atherosclerosis. Musculoskeletal: Osteopenia. Vertebral augmentation at T10 through L2. Mild T6 compression deformity is unchanged. IMPRESSION: 1. Findings most consistent with extensive recurrent and progressive disease. Adenopathy throughout the mediastinum, low neck, and within the right infrahilar region. New and increased pulmonary nodularity as detailed above. 2. New small left pleural effusion. 3.  Coronary artery atherosclerosis. Aortic atherosclerosis. 4. Esophageal air fluid level suggests dysmotility or gastroesophageal reflux. These results will be called to the ordering clinician or representative by the Radiologist Assistant, and communication documented in the PACS or zVision Dashboard. Electronically Signed   By: Abigail Miyamoto M.D.   On: 12/06/2016 13:47   Dg Chest Port 1 View  Result Date: 01/02/2017 CLINICAL DATA:  Shortness of breath and patient with history of lung carcinoma. EXAM: PORTABLE CHEST 1 VIEW COMPARISON:  CT chest 12/06/2016. Plain film of the chest 01/01/2017. FINDINGS: Volume loss in the left chest with left basilar atelectasis and small effusion are again identified. The right lung is hyperexpanded and clear. Heart size is normal. Aortic atherosclerosis is noted. IMPRESSION: No change in volume loss in the left chest this patient with known carcinoma. Small left effusion and basilar atelectasis are seen. Emphysema. Electronically Signed   By: Inge Rise M.D.   On: 01/02/2017 17:48     CBC  Recent Labs Lab 12/28/16 1600 01/02/17 1723 01/03/17 0359  WBC 12.5* 11.0 14.2*  HGB 13.0 14.3 12.6  HCT 38.5 43.6 37.8  PLT  215 222 198  MCV 96.1 95.6 95.8  MCH 32.3 31.4 31.9  MCHC 33.6 32.8 33.3  RDW 14.7* 14.9* 14.5  LYMPHSABS 1.2 0.5*  --   MONOABS 0.5 0.5  --   EOSABS 0.0 0.0  --    BASOSABS 0.1 0.0  --     Chemistries   Recent Labs Lab 12/28/16 1600 01/02/17 1723 01/03/17 0359  NA 136 134* 137  K 3.8 4.3 3.9  CL 101 99* 102  CO2 27 27 23   GLUCOSE 91 110* 200*  BUN 16 20 24*  CREATININE 0.93 0.91 1.03*  CALCIUM 8.9 9.1 8.6*  AST 36 34  --   ALT 24 23  --   ALKPHOS 81 82  --   BILITOT 0.6 1.1  --    ------------------------------------------------------------------------------------------------------------------ estimated creatinine clearance is 24.5 mL/min (A) (by C-G formula based on SCr of 1.03 mg/dL (H)). ------------------------------------------------------------------------------------------------------------------ No results for input(s): HGBA1C in the last 72 hours. ------------------------------------------------------------------------------------------------------------------ No results for input(s): CHOL, HDL, LDLCALC, TRIG, CHOLHDL, LDLDIRECT in the last 72 hours. ------------------------------------------------------------------------------------------------------------------ No results for input(s): TSH, T4TOTAL, T3FREE, THYROIDAB in the last 72 hours.  Invalid input(s): FREET3 ------------------------------------------------------------------------------------------------------------------ No results for input(s): VITAMINB12, FOLATE, FERRITIN, TIBC, IRON, RETICCTPCT in the last 72 hours.  Coagulation profile  Recent Labs Lab 01/02/17 1723  INR 0.97    No results for input(s): DDIMER in the last 72 hours.  Cardiac Enzymes  Recent Labs Lab 01/02/17 1723  TROPONINI <0.03   ------------------------------------------------------------------------------------------------------------------ Invalid input(s): POCBNP    Assessment & Plan  Patient is a 76 year old admitted with acute respiratory failure  1. Acute respiratory failure with hypoxia due to COPD exacerbation and lung cancer. Continue BiPAP, IV Solu-Medrol, Xopenex  every 6 hours. Continue antibiotics Patient with extensive metastatic disease noted on a CT scan recently prognosis very poor   2. Metastatic lung cancer. Prognosis very poor palliative care consult pending  3. Hypertension. Continue home hypertension medication.Blood pressure stable  4. Miscellaneous Lovenox for DVT prophylaxis       Code Status Orders        Start     Ordered   01/02/17 2139  Do not attempt resuscitation (DNR)  Continuous    Question Answer Comment  In the event of cardiac or respiratory ARREST Do not call a "code blue"   In the event of cardiac or respiratory ARREST Do not perform Intubation, CPR, defibrillation or ACLS   In the event of cardiac or respiratory ARREST Use medication by any route, position, wound care, and other measures to relive pain and suffering. May use oxygen, suction and manual treatment of airway obstruction as needed for comfort.      01/02/17 2138    Code Status History    Date Active Date Inactive Code Status Order ID Comments User Context   08/03/2016  2:14 PM 08/03/2016  5:51 PM Full Code [id number]  Hessie Knows, MD Inpatient   07/23/2015  4:05 PM 07/27/2015  2:23 PM Full Code [id number]  Jeanie Cooks, MD ED    Advance Directive Documentation     Most Recent Value  Type of Advance Directive  Living will  Pre-existing out of facility DNR order (yellow form or pink MOST form)  -  "MOST" Form in Place?  -           Consults  Intensivist  DVT Prophylaxis  Lovenox   Lab Results  Component Value Date   PLT 198 01/03/2017     Time Spent  in minutes   69min Greater than 50% of time spent in care coordination and counseling patient regarding the condition and plan of care.   Dustin Flock M.D on 01/03/2017 at 1:22 PM  Between 7am to 6pm - Pager - [phone]  After 6pm go to [email or web address] - password [email or web address] [place] [facility]   Office  [phone]

## 2017-01-03 NOTE — Consult Note (Signed)
Consultation Note Date: 01/03/2017   Patient Name: Melody Green  DOB: [birth year]  MRN: [id number]  Age / Sex: 76 y.o., female  PCP: Valerie Roys, DO Referring Physician: Dustin Flock, MD  Reason for Consultation: Establishing goals of care  HPI/Patient Profile: 76 y.o. female  with past medical history of stage IV small cell lung cancer, stroke, pneumonia, osteoporosis, hypothyroidism, hyperlipidemia, depression, DDD, COPD, chronic kidney disease, anxiety, AAA admitted on 01/02/2017 with shortness of breath, cough, and wheezing. In ED, patient hypoxic in 70's and placed on BiPAP. Chest xray without infiltration. Admitted to stepdown and receiving IV steroids, nebulizers, and empiric ceftriaxone. Followed by Dr. Grayland Ormond. Note reviewed from 01/01/17 where end-of-life and hospice were discussed. At that time, patient/family wanting to continue aggressive treatment. Palliative medicine consultation for goals of care.        Clinical Assessment and Goals of Care: I have reviewed medical records, discussed with care team, and met with patient, two sons(Samuel and Scott), and daughter-in-law Anne Ng) at bedside to discuss diagnosis, GOC, EOL wishes, disposition and options. Patient awake, alert, and oriented. Tolerating 4L [place]. No distress or tachypnea.   Introduced Palliative Medicine as specialized medical care for people living with serious illness. It focuses on providing relief from the symptoms and stress of a serious illness. The goal is to improve quality of life for both the patient and the family.  We discussed a brief life review of the patient. Widowed. Her husband died about 3 years ago from lung cancer. She has three sons and four grandchildren. She has lived with son, Arcola Jansky Jeneen Rinks) for 5 years. Diagnosed with lung cancer in September 2017 for which she completed chemotherapy and had "three months off." Family  speaks of findings in June of disease progression to right lung. She started chemotherapy this past Monday. [name] has a poor functional status with dyspnea on exertion. She refuses to use a walker. She has a poor appetite with difficulty swallowing. (Speech has evaluated and started dysphagia diet).     Discussed hospital diagnoses, interventions, and underlying lung cancer and COPD contributing to respiratory distress/failure. Also, concern with this becoming a cycle as cancer continues to progress.    Patient becomes very tearful during the conversation. She speaks of Dr. Grayland Ormond recommending hospice services but "Spud doesn't want people in the home." Also, Spud has been adamant about her continuing treatment. Spud is with her majority of the day, but family shares concerns of her needing assist with bathing (he doesn't want to bathe his mother).   Advanced directives, concepts specific to code status, artifical feeding and hydration, and rehospitalization were considered and discussed. Patient has a documented HCPOA and living will. Mikeal Hawthorne is HCPOA. Patient confirms DNR/DNI. Family agrees stating "it would not change her lung cancer or COPD."   Palliative Care and Hospice services outpatient were explained and offered. Patient is familiar with hospice services in the home because she cared for her husband at EOL. Family understands hospice would mean stopping chemotherapy and focusing on  a comfort pathway to control symptoms if she became distressed. Smitty Knudsen, and [place] feel hospice would be the best support for her moving forward and assist with "quality" time and allow her to be home at the end of her life. Patient is resistant because of her son, Spud. Encouraged patient to continue conversations with her family about her EOL wishes as her cancer progresses.    **Spoke with Joylene John in family waiting room. They have a good understanding of guarded prognosis with risk for  respiratory decompensation. "It's just a matter of time. This could happen again in another day, week, or month." Answered more questions regarding hospice services. They speak of Spud and Eathel's resistance to having conversations regarding EOL. Mikeal Hawthorne feels Spud understands her cancer is terminal but wants to do "everything to prolong her life." They plan to further discuss [place] with patient when Spud is available.    SUMMARY OF RECOMMENDATIONS    Patient/family confirm DNR/DNI.   Continue current interventions and BiPAP prn. If she declines, family requests focus on comfort and relief from suffering.  Patient/family considering hospice options. Ongoing conversations with all sons.   PMT will f/u in AM.  Code Status/Advance Care Planning:  DNR  Symptom Management:   Per attending  Palliative Prophylaxis:   Aspiration, Delirium Protocol, Frequent Pain Assessment, Oral Care and Turn Reposition  Psycho-social/Spiritual:   Desire for further Chaplaincy support: yes  Additional Recommendations: Caregiving  Support/Resources and Education on Hospice  Prognosis:   Unable to determine: guarded with acute on chronic respiratory failure due to stage IV small cell lung cancer and COPD  Discharge Planning: To Be Determined      Primary Diagnoses: Present on Admission: . Acute on chronic respiratory failure with hypoxia ([place])   I have reviewed the medical record, interviewed the patient and family, and examined the patient. The following aspects are pertinent.  Past Medical History:  Diagnosis Date  . Abdominal aneurysm ([place])   . Anxiety   . Cancer of left lung ([place]) 03/10/2016   lung  . Chronic kidney disease   . Collagenous colitis   . COPD (chronic obstructive pulmonary disease) ([place])   . DDD (degenerative disc disease), cervical   . Depression   . Hernia of abdominal cavity   . Hyperlipidemia   . Hypertension   . Hypothyroidism   . Osteoporosis   . Pneumonia   .  Stroke ([place])   . Tobacco abuse   . Urine incontinence   . Vitamin B 12 deficiency    Social History   Social History  . Marital status: Widowed    Spouse name: N/A  . Number of children: N/A  . Years of education: N/A   Social History Main Topics  . Smoking status: Current Every Day Smoker    Packs/day: 0.25    Types: Cigarettes  . Smokeless tobacco: Never Used     Comment: quit smoking 03/04/16  . Alcohol use No  . Drug use: No  . Sexual activity: No   Other Topics Concern  . None   Social History Narrative  . None   Family History  Problem Relation Age of Onset  . Diabetes Mother   . Diabetes Father    Scheduled Meds: . chlorhexidine  15 mL Mouth Rinse BID  . enoxaparin (LOVENOX) injection  30 mg Subcutaneous Q24H  . feeding supplement (ENSURE ENLIVE)  237 mL Oral BID BM  . ipratropium-albuterol  3 mL Nebulization Q6H  . mouth  rinse  15 mL Mouth Rinse q12n4p  . methylPREDNISolone (SOLU-MEDROL) injection  40 mg Intravenous Q12H  . multivitamin with minerals  1 tablet Oral Daily   Continuous Infusions: . cefTRIAXone (ROCEPHIN)  IV Stopped (01/03/17 3086)  . lactated ringers     PRN Meds:.acetaminophen **OR** acetaminophen, albuterol, bisacodyl, ketorolac, [DISCONTINUED] ondansetron **OR** ondansetron (ZOFRAN) IV, senna-docusate Medications Prior to Admission:  Prior to Admission medications   Medication Sig Start Date End Date Taking? Authorizing Provider  acetaminophen (TYLENOL) 500 MG tablet Take 1,000 mg by mouth every 6 (six) hours as needed for moderate pain or headache.   Yes [provider]  albuterol (PROVENTIL HFA;VENTOLIN HFA) 108 (90 Base) MCG/ACT inhaler Inhale 2 puffs into the lungs every 6 (six) hours as needed for wheezing or shortness of breath. 12/19/16  Yes Johnson, Rosilyn Coachman P, DO  amLODipine (NORVASC) 2.5 MG tablet Take 1 tablet (2.5 mg total) by mouth daily. 10/31/16  Yes Johnson, Ryen Rhames P, DO  atorvastatin (LIPITOR) 20 MG tablet Take 1 tablet  (20 mg total) by mouth at bedtime. 10/31/16  Yes Johnson, Lott Seelbach P, DO  bisacodyl (DULCOLAX) 5 MG EC tablet Take 5 mg by mouth daily as needed for moderate constipation.   Yes [provider]  clopidogrel (PLAVIX) 75 MG tablet Take 75 mg by mouth at bedtime.   Yes [provider]  cyanocobalamin (,VITAMIN B-12,) 1000 MCG/ML injection Inject 1,000 mcg into the muscle every 30 (thirty) days.   Yes [provider]  cyclobenzaprine (FLEXERIL) 10 MG tablet Take 1 tablet (10 mg total) by mouth every 8 (eight) hours as needed for muscle spasms. 12/19/16  Yes Johnson, Harlie Buening P, DO  furosemide (LASIX) 20 MG tablet Take 20 mg by mouth daily as needed.   Yes [provider]  guaiFENesin 200 MG tablet Take 2 tablets (400 mg total) by mouth daily. 12/05/16  Yes Johnson, Inola Lisle P, DO  levothyroxine (SYNTHROID, LEVOTHROID) 75 MCG tablet TAKE 1 TABLET EVERY DAY BEFORE BREAKFAST 05/31/16  Yes Volney American, PA-C  lidocaine-prilocaine (EMLA) cream Apply to port site 1 hour before port is accessed each time 04/04/16  Yes Finnegan, Kathlene November, MD  LORazepam (ATIVAN) 1 MG tablet TAKE 1 TABLET TWICE DAILY AS NEEDED FOR ANXIETY 12/27/16  Yes Johnson, Charbel Los P, DO  mirtazapine (REMERON SOL-TAB) 30 MG disintegrating tablet Take 1 tablet (30 mg total) by mouth at bedtime. 12/05/16  Yes Johnson, Thy Gullikson P, DO  omeprazole (PRILOSEC) 40 MG capsule Take 1 capsule (40 mg total) by mouth daily. 12/19/16  Yes Johnson, Chanya Chrisley P, DO  ondansetron (ZOFRAN) 8 MG tablet Take 1 tablet (8 mg total) by mouth 2 (two) times daily as needed for refractory nausea / vomiting. 04/04/16  Yes Lloyd Huger, MD  prochlorperazine (COMPAZINE) 10 MG tablet Take 1 tablet (10 mg total) by mouth every 6 (six) hours as needed (Nausea or vomiting). 04/04/16  Yes Lloyd Huger, MD  traMADol (ULTRAM) 50 MG tablet Take 1 tablet (50 mg total) by mouth every 6 (six) hours as needed for moderate pain. 10/24/16  Yes Johnson, Hooria Gasparini  P, DO   Allergies  Allergen Reactions  . Hydrocodone Itching and Rash  . Codeine Rash  . Oxycodone Itching and Rash   Review of Systems  Constitutional: Positive for activity change and appetite change.  Respiratory: Positive for cough, shortness of breath and wheezing.    Physical Exam  Constitutional: She is oriented to person, place, and time. She appears cachectic. She is cooperative.  HENT:  Head: Normocephalic and atraumatic.  Cardiovascular: Regular rhythm.   Pulmonary/Chest: Effort normal. No accessory muscle usage. No tachypnea. No respiratory distress. She has decreased breath sounds.  4L [place]  Abdominal: Normal appearance.  Neurological: She is alert and oriented to person, place, and time.  Skin: Skin is warm and dry. There is pallor.  Psychiatric: She has a normal mood and affect. Her speech is normal and behavior is normal. Cognition and memory are normal.  Nursing note and vitals reviewed.  Vital Signs: BP 112/75   Pulse (!) 105   Temp 97.9 F (36.6 C) (Axillary)   Resp 20   Ht _0  (1.6 m)   Wt 33.9 kg (74 lb 11.8 oz)   SpO2 96%   BMI 13.24 kg/m  Pain Assessment: 0-10   Pain Score: 5   SpO2: SpO2: 96 % O2 Device:SpO2: 96 % O2 Flow Rate: .   IO: Intake/output summary:   Intake/Output Summary (Last 24 hours) at 01/03/17 1402 Last data filed at 01/03/17 4742  Gross per 24 hour  Intake              550 ml  Output                0 ml  Net              550 ml    LBM:   Baseline Weight: Weight: 32.2 kg (71 lb) Most recent weight: Weight: 33.9 kg (74 lb 11.8 oz)     Palliative Assessment/Data: PPS 40%   Flowsheet Rows     Most Recent Value  Intake Tab  Referral Department  Hospitalist  Unit at Time of Referral  ICU  Palliative Care Primary Diagnosis  Pulmonary  Palliative Care Type  New Palliative care  Reason for referral  Clarify Goals of Care  Date first seen by Palliative Care  01/03/17  Clinical Assessment  Palliative Performance Scale  Score  40%  Psychosocial & Spiritual Assessment  Palliative Care Outcomes  Patient/Family meeting held?  Yes  Who was at the meeting?  patient, two sons, and daughter-in-law  Palliative Care Outcomes  Clarified goals of care, Counseled regarding hospice, Provided end of life care assistance, ACP counseling assistance, Provided psychosocial or spiritual support, Improved non-pain symptom therapy     Time In: 1230 Time Out: 1350 Time Total: 65mn Greater than 50%  of this time was spent counseling and coordinating care related to the above assessment and plan.  Signed by:  MIhor Dow FNP-C Palliative Medicine Team  Phone: [phone]: [phone]  Please contact Palliative Medicine Team phone at [phone] questions and concerns.  For individual provider: See AShea Evans

## 2017-01-03 NOTE — Progress Notes (Signed)
Family informed of move to Rm 115

## 2017-01-03 NOTE — Evaluation (Signed)
Clinical/Bedside Swallow Evaluation Patient Details  Name: Melody Green MRN: [id number] Date of Birth: 08/28/1940  Today's Date: 01/03/2017 Time: SLP Start Time (ACUTE ONLY): 1200 SLP Stop Time (ACUTE ONLY): 1315 SLP Time Calculation (min) (ACUTE ONLY): 75 min  Past Medical History:  Past Medical History:  Diagnosis Date  . Abdominal aneurysm ([place])   . Anxiety   . Cancer of left lung ([place]) 03/10/2016   lung  . Chronic kidney disease   . Collagenous colitis   . COPD (chronic obstructive pulmonary disease) ([place])   . DDD (degenerative disc disease), cervical   . Depression   . Hernia of abdominal cavity   . Hyperlipidemia   . Hypertension   . Hypothyroidism   . Osteoporosis   . Pneumonia   . Stroke ([place])   . Tobacco abuse   . Urine incontinence   . Vitamin B 12 deficiency    Past Surgical History:  Past Surgical History:  Procedure Laterality Date  . ABDOMINAL AORTIC ANEURYSM REPAIR    . ABDOMINAL HYSTERECTOMY    . APPENDECTOMY    . BACK SURGERY  [phone]  . CHOLECYSTECTOMY    . FLEXIBLE BRONCHOSCOPY N/A 03/17/2016   Procedure: FLEXIBLE BRONCHOSCOPY;  Surgeon: Wilhelmina Mcardle, MD;  Location: [facility] ORS;  Service: Pulmonary;  Laterality: N/A;  . KYPHOPLASTY N/A 07/13/2016   Procedure: KYPHOPLASTY;  Surgeon: Hessie Knows, MD;  Location: [facility] ORS;  Service: Orthopedics;  Laterality: N/A;  . KYPHOPLASTY N/A 08/03/2016   Procedure: KYPHOPLASTY T12, L2;  Surgeon: Hessie Knows, MD;  Location: [facility] ORS;  Service: Orthopedics;  Laterality: N/A;  . PERIPHERAL VASCULAR CATHETERIZATION N/A 04/03/2016   Procedure: Glori Luis Cath Insertion;  Surgeon: Algernon Huxley, MD;  Location: [facility];  Service: Cardiovascular;  Laterality: N/A;   HPI:  Pt is a 76 y.o. female with aery unfortunate history of metastatic lung cancer who is been advised that hospice would not be a bad option but would prefer to have "everything done" and accordingly is getting chemotherapy which she started yesterday.  She has a history of multiple medical issues including GERD on PPI and a Hernia, cva, tobacco use, depression, anxiety, HTN and COPD, and is not on oxygen at home. She has been having a wheeze at home. He did not get better with her home albuterol. Denies fever. Feels that she may have to cough but cannot get anything up. Feels generally weak. EMS found her satting 70% on room air. Pt stated she was recently told to take her PPI 2x daily per "my cancer doctor". Pt described a long-standing h/o REFLUX symptoms and episodes of acid reflux and vomiting. Pt endorsed c/o food "sticking" in her chest and "not going down" - moreso meats and breads(this has been increasing over past few months).    Assessment / Plan / Recommendation Clinical Impression  Pt appears at a reduced risk for aspiration from an oropharyngeal phase standpoint of swallowing. However, pt is at increased risk for aspiration of any REFLUX or regurgitated material that comes back up from the stomach - "I have acid reflux real bad sometimes". Per pt's description, pt does have a baseline Esophageal dysmotility and GERD/Hiatal Hernia(she was recently told to increase her PPI by CA MD). Pt consumed trials of thin liquids via cup(NO Straw), and trials of purees/soft solid w/ NO immediate,overt s/s of aspiration noted; no immediate decline in respiratory status or change in vocal quality post trials - O2 sats remained 94-95%. Pt was given rest breaks  to avoid increased respiratory effort/WOB w/ exertion of po trials. Pt fed self w/ setup support. Thorough education given to pt and family present on general aspiration precautions including No straw use and use of applesauce when swallowing pills; recommend strict REFLUX precautions and education on Esophageal dysmotility and strategies when eating; food consistencies and preparation for easiser Esophageal clearing. Encouraged pt to f/u w/ GI for further education and management of pt's Esophageal  dysmotility; Reflux. ST services will f/u x for any further education needed while admitted. Recommended pt f/u w/ Dietician for further management d/t baseline comorbidities. Of note, recommend pt refrain from Meats and Breads and thick potatoes d/t the demand on the Esophagus to attempt to transfer/clear these foods and the Regurgitation that can occur - discussed use of smoothies, fruits d/t the inherent fluid base. Pt/family agreed.  SLP Visit Diagnosis: Dysphagia, pharyngoesophageal phase (R13.14);Dysphagia, unspecified (R13.10) (Esophageal phase dysphagia)    Aspiration Risk   (reduced from an oropharyngeal phase standpoint)    Diet Recommendation  Dysphagia level 2(minced foods, moistened well); Thin liquids. Strict REFLUX precautions; aspiration precautions. Supplements per Dietician but more liquid based.   Medication Administration: Whole meds with puree (or Crushed if easier for swallowing/clearing Esophagealy)    Other  Recommendations Recommended Consults: Consider GI evaluation (Dietician f/u from more drink supplement) Oral Care Recommendations: Oral care BID;Patient independent with oral care;Staff/trained caregiver to provide oral care   Follow up Recommendations None      Frequency and Duration min 1 x/week  1 week       Prognosis Prognosis for Safe Diet Advancement: Fair (-Good) Barriers to Reach Goals:  (GI deficits)      Swallow Study   General Date of Onset: 01/02/17 HPI: Pt is a 76 y.o. female with aery unfortunate history of metastatic lung cancer who is been advised that hospice would not be a bad option but would prefer to have "everything done" and accordingly is getting chemotherapy which she started yesterday. She has a history of multiple medical issues including GERD on PPI and a Hernia, cva, tobacco use, depression, anxiety, HTN and COPD, and is not on oxygen at home. She has been having a wheeze at home. He did not get better with her home albuterol.  Denies fever. Feels that she may have to cough but cannot get anything up. Feels generally weak. EMS found her satting 70% on room air. Pt stated she was recently told to take her PPI 2x daily per "my cancer doctor". Pt described a long-standing h/o REFLUX symptoms and episodes of acid reflux and vomiting. Pt endorsed c/o food "sticking" in her chest and "not going down" - moreso meats and breads(this has been increasing over past few months).  Type of Study: Bedside Swallow Evaluation Previous Swallow Assessment: none  Diet Prior to this Study: Regular;Thin liquids (has trouble w/ meats, breads; GERD symptoms; on PPI) Temperature Spikes Noted: No (wbc elevated) Respiratory Status: Nasal cannula History of Recent Intubation: No Behavior/Cognition: Alert;Cooperative;Pleasant mood (weak appearing) Oral Cavity Assessment: Within Functional Limits;Dry (min) Oral Care Completed by SLP: Recent completion by staff Oral Cavity - Dentition: Dentures, top (bottom dentition) Vision: Functional for self-feeding Self-Feeding Abilities: Able to feed self;Needs assist;Needs set up (shaky) Patient Positioning: Upright in bed (had not always been sitting upright in bed to drink b/f) Baseline Vocal Quality: Normal;Low vocal intensity Volitional Cough: Strong Volitional Swallow: Able to elicit    Oral/Motor/Sensory Function Overall Oral Motor/Sensory Function: Within functional limits   [employer]  chips: Within functional limits Presentation: Spoon (fed; 3 trials)   Thin Liquid Thin Liquid: Within functional limits Presentation: Cup;Self Fed (8 trials) Other Comments: "I get full easily"    Nectar Thick Nectar Thick Liquid: Within functional limits Presentation: Cup;Self Fed (x1 trial)   Honey Thick Honey Thick Liquid: Not tested   Puree Puree: Within functional limits Presentation: Self Fed;Spoon (6 trials) Other Comments: "I get full easily"   Solid   GO   Solid: Within functional limits  (well-broken down pieces, moistened) Presentation: Self Fed (2 trials) Other Comments: "I get full easily"         Orinda Kenner, MS, CCC-SLP Watson,Katherine 01/03/2017,1:52 PM

## 2017-01-03 NOTE — Progress Notes (Signed)
RN notified NP, Hinton Dyer of patient difficulty taking in a lot of PO as she has difficulty with esophagus per Speech Eval, patient had not void this shift, perhaps requiring IV fluids to help with hydration.  NP states will take a look at her chart and order accordingly.

## 2017-01-03 NOTE — Progress Notes (Signed)
Patient told RN that she has had trouble swallowing bread "it get stuck in my throat and I can't get it up well"  Patient cough when given a sip of water.  RN notified Dr Alva Garnet, speech eval ordered.

## 2017-01-04 MED ORDER — PREDNISONE 50 MG PO TABS
50.0000 mg | ORAL_TABLET | Freq: Every day | ORAL | Status: DC
  Administered 2017-01-05: 10:00:00 50 mg via ORAL
  Filled 2017-01-04: qty 1

## 2017-01-04 MED ORDER — PANTOPRAZOLE SODIUM 40 MG PO TBEC
40.0000 mg | DELAYED_RELEASE_TABLET | Freq: Every day | ORAL | Status: DC
  Administered 2017-01-04: 40 mg via ORAL
  Filled 2017-01-04: qty 1

## 2017-01-04 MED ORDER — LEVOTHYROXINE SODIUM 25 MCG PO TABS
75.0000 ug | ORAL_TABLET | Freq: Every day | ORAL | Status: DC
  Administered 2017-01-05: 75 ug via ORAL
  Filled 2017-01-04: qty 1

## 2017-01-04 MED ORDER — KETOROLAC TROMETHAMINE 15 MG/ML IJ SOLN
15.0000 mg | Freq: Four times a day (QID) | INTRAMUSCULAR | Status: DC | PRN
  Filled 2017-01-04: qty 1

## 2017-01-04 MED ORDER — GUAIFENESIN 100 MG/5ML PO SOLN
5.0000 mL | ORAL | Status: DC | PRN
  Filled 2017-01-04: qty 5

## 2017-01-04 NOTE — Progress Notes (Signed)
Daily Progress Note   Patient Name: Melody Green       Date: 01/04/2017 DOB: 03-May-1941  Age: 76 y.o. MRN#: [id number] Attending Physician: Demetrios Loll, MD Primary Care Physician: Valerie Roys, DO Admit Date: 01/02/2017  Reason for Consultation/Follow-up: Establishing goals of care  Subjective/GOC: Patient awake, alert, and oriented this afternoon. Denies pain or discomfort. Hungry for lunch. She has gotten up to bedside commode. Intermittent dyspnea at rest. PT eval pending.   Son, Jeneen Rinks ([place]), at bedside. Discussed diagnoses, interventions, and risk for respiratory decompensation due to underlying lung cancer and COPD. Discussed palliative and hospice services in detail. Spud is resistant to hospice. He is hopeful to return to cancer center on Monday for next round of chemotherapy. We discussed chemotherapy not curing her stage IV cancer and possibly being more harm to her with decreased functional and nutritional status. He is concerned about her becoming in distress when he is not home. We discussed getting oxygen and nebulizer machine in the home. Patient has numbers of neighbors that could be readily available if he is not. Patient was open to discussing hospice services with Spud and the "great support" they provided her husband at the end of his life.    Updated son and POA Mikeal Hawthorne via telephone. Encouraged he continue conversations with patient and brothers regarding [place], EOL wishes, and hospice services. Agreeable with palliative services to follow at home on discharge.   Length of Stay: 2  Current Medications: Scheduled Meds:  . chlorhexidine  15 mL Mouth Rinse BID  . enoxaparin (LOVENOX) injection  30 mg Subcutaneous Q24H  . feeding supplement (ENSURE ENLIVE)  237 mL Oral BID BM  .  ipratropium-albuterol  3 mL Nebulization Q6H  . mouth rinse  15 mL Mouth Rinse q12n4p  . multivitamin with minerals  1 tablet Oral Daily  . pantoprazole (PROTONIX) IV  40 mg Intravenous Q24H  . [START ON 01/05/2017] predniSONE  50 mg Oral Q breakfast    Continuous Infusions: . cefTRIAXone (ROCEPHIN)  IV 1 g (01/04/17 0913)  . lactated ringers 75 mL/hr at 01/04/17 0551   PRN Meds: acetaminophen **OR** acetaminophen, albuterol, bisacodyl, guaiFENesin, ketorolac, [DISCONTINUED] ondansetron **OR** ondansetron (ZOFRAN) IV, senna-docusate  Physical Exam  Constitutional: She is oriented to person, place, and time. She appears cachectic. She is cooperative.  HENT:  Head: Normocephalic and atraumatic.  Cardiovascular: Regular rhythm.   Pulmonary/Chest: No accessory muscle usage. No tachypnea. No respiratory distress. She has decreased breath sounds.  Intermittent dyspnea at rest  Abdominal: Normal appearance.  Neurological: She is alert and oriented to person, place, and time.  Skin: Skin is warm and dry.  Psychiatric: She has a normal mood and affect. Her speech is normal and behavior is normal. Cognition and memory are normal.  Nursing note and vitals reviewed.          Vital Signs: BP 135/68 (BP Location: Left Arm)   Pulse 86   Temp 97.6 F (36.4 C) (Oral)   Resp 16   Ht 5\' 3"  (1.6 m)   Wt 33.9 kg (74 lb 11.8 oz)   SpO2 96%   BMI 13.24 kg/m  SpO2: SpO2: 96 % O2 Device: O2 Device: Nasal Cannula O2 Flow Rate: O2 Flow Rate (L/min): 2 L/min  Intake/output summary:  Intake/Output Summary (Last 24 hours) at 01/04/17 1215 Last data filed at 01/04/17 0551  Gross per 24 hour  Intake           1137.5 ml  Output              475 ml  Net            662.5 ml   LBM: Last BM Date:  (pt can't remember/gave dulcolax) Baseline Weight: Weight: 32.2 kg (71 lb) Most recent weight: Weight: 33.9 kg (74 lb 11.8 oz)  Palliative Assessment/Data: PPS 40%   Flowsheet Rows     Most Recent Value    Intake Tab  Referral Department  Hospitalist  Unit at Time of Referral  ICU  Palliative Care Primary Diagnosis  Pulmonary  Date Notified  01/02/17  Palliative Care Type  New Palliative care  Reason for referral  Clarify Goals of Care  Date of Admission  01/02/17  Date first seen by Palliative Care  01/03/17  # of days Palliative referral response time  1 Day(s)  # of days IP prior to Palliative referral  0  Clinical Assessment  Palliative Performance Scale Score  40%  Psychosocial & Spiritual Assessment  Palliative Care Outcomes  Patient/Family meeting held?  Yes  Who was at the meeting?  patient, two sons, and daughter-in-law  Palliative Care Outcomes  Clarified goals of care, Counseled regarding hospice, Provided end of life care assistance, ACP counseling assistance, Provided psychosocial or spiritual support, Improved non-pain symptom therapy      Patient Active Problem List   Diagnosis Date Noted  . Small cell lung cancer ([place])   . Palliative care by specialist   . Goals of care, counseling/discussion   . DNR (do not resuscitate)   . Acute on chronic respiratory failure with hypoxia ([place]) 01/02/2017  . SOB (shortness of breath)   . Cough   . Dyspnea   . COPD exacerbation ([place])   . Adult failure to thrive 10/31/2016  . PVD (peripheral vascular disease) ([place]) 10/10/2016  . Stroke ([place])   . Primary cancer of left lung metastatic to other site ([place]) 03/12/2016  . Tobacco abuse 02/08/2016  . History of small bowel obstruction   . Protein-calorie malnutrition, severe 07/24/2015  . Abdominal pain 06/14/2015  . Hypothyroid 05/03/2015  . Purpura senilis ([place]) 05/03/2015  . Positional vertigo of both ears 05/03/2015  . Depression 12/23/2014  . Hyperlipidemia 12/23/2014  . Benign hypertension with CKD (chronic kidney disease) stage IV ([place]) 12/04/2014  . Dizziness 12/04/2014  .  Hyponatremia 12/04/2014    Palliative Care Assessment & Plan   Patient Profile: 76 y.o.  female  with past medical history of stage IV small cell lung cancer, stroke, pneumonia, osteoporosis, hypothyroidism, hyperlipidemia, depression, DDD, COPD, chronic kidney disease, anxiety, AAA admitted on 01/02/2017 with shortness of breath, cough, and wheezing. In ED, patient hypoxic in 70's and placed on BiPAP. Chest xray without infiltration. Admitted to stepdown and receiving IV steroids, nebulizers, and empiric ceftriaxone. Followed by Dr. Grayland Ormond. Note reviewed from 01/01/17 where end-of-life and hospice were discussed. At that time, patient/family wanting to continue aggressive treatment. Palliative medicine consultation for goals of care.        Assessment: Acute on chronic respiratory failure COPD exacerbation Stage IV small cell lung cancer Left pleural effusion  Recommendations/Plan:  DNR/DNI  Patient/family considering hospice options in the near future. Agreeable with palliative services to follow on discharge.   They plan to f/u with Oncology on Monday for next cycle of chemo.   Patient would benefit from home oxygen and nebulizer machine. Discussed with attending and RN CM. RN to check oxygen saturation with ambulation.   PT eval pending. Patient/family would appreciate home PT and CNA's if possible.   PMT will continue to support patient/family through hospitalization.   Code Status: DNR/DNI   Code Status Orders        Start     Ordered   01/02/17 2139  Do not attempt resuscitation (DNR)  Continuous    Question Answer Comment  In the event of cardiac or respiratory ARREST Do not call a "code blue"   In the event of cardiac or respiratory ARREST Do not perform Intubation, CPR, defibrillation or ACLS   In the event of cardiac or respiratory ARREST Use medication by any route, position, wound care, and other measures to relive pain and suffering. May use oxygen, suction and manual treatment of airway obstruction as needed for comfort.      01/02/17 2138    Code  Status History    Date Active Date Inactive Code Status Order ID Comments User Context   08/03/2016  2:14 PM 08/03/2016  5:51 PM Full Code [id number]  Hessie Knows, MD Inpatient   07/23/2015  4:05 PM 07/27/2015  2:23 PM Full Code [id number]  Jeanie Cooks, MD ED    Advance Directive Documentation     Most Recent Value  Type of Advance Directive  Living will  Pre-existing out of facility DNR order (yellow form or pink MOST form)  -  "MOST" Form in Place?  -       Prognosis:   Unable to determine: guarded with acute on chronic respiratory failure secondary to small cell lung cancer and COPD.   Discharge Planning:  Home with [place] was discussed with patient, sons (Spud and Mikeal Hawthorne), RN, RN CM, Dr. Bridgett Larsson, and Dr. Grayland Ormond  Thank you for allowing the Palliative Medicine Team to assist in the care of this patient.   Time In: 1200 Time Out: 1245 Total Time 30min Prolonged Time Billed  no       Greater than 50%  of this time was spent counseling and coordinating care related to the above assessment and plan.  Ihor Dow, FNP-C Palliative Medicine Team  Phone: [phone] Fax: [phone]  Please contact Palliative Medicine Team phone at [phone] for questions and concerns.

## 2017-01-04 NOTE — Care Management (Addendum)
Admitted to [facility] with the diagnosis of acute on chronic respiratory failure. Lives with son, Jeneen Rinks [phone]). Other son is Mikeal Hawthorne [phone]). Last seen Park Liter NP at [facility] 10/31/16.Diagnosed with lung cancer. [facility]es to the [facility] for physician cancer services. Prescriptions are filled at [facility] in [facility] or [facility] order. Rolling walker, cane, and shower chair in the home. Decreased appetite for awhile. Takes care of all basic activities of daily living herself. No home oxygen Palliative Care consult in progress. Spoke with Ihor Dow NP, Palliative representative.  Family meetings continue. Will need home oxygen. Will need Palliative in the home. Hoping for Hospice in the home. Physical therapy evaluation pending. Shelbie Ammons RN MSN CCM Care Management [phone]

## 2017-01-04 NOTE — Progress Notes (Signed)
Speech Language Pathology Treatment: Dysphagia  Patient Details Name: Melody Green MRN: [id number] DOB: 08/16/1940 Today's Date: 01/04/2017 Time: 3300-7622 SLP Time Calculation (min) (ACUTE ONLY): 36 min  Assessment / Plan / Recommendation Clinical Impression  Pt seen today for toleration of the recommended diet of Dysphagia level 2 w/ thin liquids - modified food consistency and Reflux precautions for easier Esophageal clearing/swallowing. Pt appears at a reduced risk for aspiration from an oropharyngeal phase standpoint of swallowing. However, pt is at increased risk for aspiration of any REFLUX or regurgitated material that comes back up from the stomach - "I have acid reflux real bad sometimes". Per pt's description, pt does have a baseline Esophageal dysmotility and GERD/Hiatal Hernia(she was recently told to increase her PPI by CA MD). Pt consumed trials of thin liquids via cup(NO Straw) w/ NO immediate,overt s/s of aspiration noted; no immediate decline in respiratory status or change in vocal quality post trials. Pt stated she tolerated some of the minced green beans and ate a Yogurt w/ no difficulty at the lunch meal; "I just cannot eat that much at a time". Pt fed self w/ setup support.  Thorough education given to pt on general aspiration precautions including No Straw use and use of applesauce when swallowing pills; recommend small, frequent snacks/meals and strict REFLUX precautions and education on Esophageal dysmotility and strategies when eating; food consistencies and preparation of foods for easiser Esophageal clearing. Encouraged pt to f/u w/ GI for further education and management of pt's Esophageal dysmotility; Reflux. Recommended pt f/u w/ Dietician for further management and supplements d/t baseline comorbidities. Of note, recommended pt refrain from Meats and Breads and thick potatoes d/t the demand on the Esophagus (to attempt to transfer/clear these foods) and the Regurgitation  that can occur - discussed use of smoothies, fruits d/t the inherent fluid base. Pt/friends agreed.    HPI HPI: Pt is a 76 y.o. female with aery unfortunate history of metastatic lung cancer who is been advised that hospice would not be a bad option but would prefer to have "everything done" and accordingly is getting chemotherapy which she started yesterday. She has a history of multiple medical issues including GERD on PPI and a Hernia, cva, tobacco use, depression, anxiety, HTN and COPD, and is not on oxygen at home. She has been having a wheeze at home. He did not get better with her home albuterol. Denies fever. Feels that she may have to cough but cannot get anything up. Feels generally weak. EMS found her satting 70% on room air. Pt stated she was recently told to take her PPI 2x daily per "my cancer doctor". Pt described a long-standing h/o REFLUX symptoms and episodes of acid reflux and vomiting. Pt endorsed c/o food "sticking" in her chest and "not going down" - moreso meats and breads(this has been increasing over past few months).       SLP Plan  All goals met       Recommendations  Diet recommendations: Dysphagia 2 (fine chop);Thin liquid Liquids provided via: Cup;No straw Medication Administration: Whole meds with puree (crushed as needed for easier Esophageal clearing) Supervision: Patient able to self feed;Intermittent supervision to cue for compensatory strategies Compensations: Minimize environmental distractions;Slow rate;Small sips/bites;Lingual sweep for clearance of pocketing;Multiple dry swallows after each bite/sip;Follow solids with liquid Postural Changes and/or Swallow Maneuvers: Seated upright 90 degrees;Upright 30-60 min after meal (REFLUX precautions)                General recommendations: Other(comment) (  Dietician f/u for supplements) Oral Care Recommendations: Oral care BID;Patient independent with oral care;Staff/trained caregiver to provide oral  care Follow up Recommendations: None (at this time but if any changes then home health f/u) SLP Visit Diagnosis: Dysphagia, pharyngoesophageal phase (R13.14) (Esophageal phase dysphagia) Plan: All goals met       GO                Melody Kenner, MS, CCC-SLP [name] 01/04/2017, 6:11 PM

## 2017-01-04 NOTE — Evaluation (Signed)
Physical Therapy Evaluation Patient Details Name: Melody Green MRN: [id number] DOB: 05-18-1941 Today's Date: 01/04/2017   History of Present Illness  76 y/o female admitted for COPD exacerbation on 01/02/17. Pt complains of SOB and wheezing that's progressively worsened for the last 24hrs. Pt started new round of chemotherapy Monday (01/01/17) for metastatic lung cancer. PMH includes COPD (no O2 at home), HTN, HLD, CKD, pneumonia, stroke, AAA, anxiety, depression, hypothyroid, urinary incontinence, and B12 deficiency.    Clinical Impression  Pt is a pleasant 76 year old admitted for COPD exacerbation. Pt performs bed mobility with min assist and transfers/ambulates with min guard. Ambulated from EOB to recliner (94ft), then from recliner to/from room door (65ft) with RW. Performed on 2L O2. Pt slightly impulsive while ambulating, requiring mod verbal cueing to watch RW placement relative to O2 tank and IV pole. SAO2 after ambulating: 85% with mild SOB noted. SAO2 recovered to >90% within 3-75min. Pt qualified for O2 at home this session: SAO2 at rest on room air (86%) and SAO2 at rest on 2L O2 (93%). Pt demonstrates deficits in activity tolerance/endurance and strength. Would benefit from skilled PT to address above deficits and promote optimal return to PLOF. Pt is motivated to participate in therapy. PT recommends dc home with home health PT.     Follow Up Recommendations Home health PT    Equipment Recommendations  3in1 (PT)    Recommendations for Other Services       Precautions / Restrictions Precautions Precautions: Fall Restrictions Weight Bearing Restrictions: No      Mobility  Bed Mobility Overal bed mobility: Needs Assistance Bed Mobility: Supine to Sit     Supine to sit: Min assist     General bed mobility comments: Min assist for scoot and pull UE's to EOB. No dizziness noted. Pt steady with feet support and BUE's in lap.  Transfers Overall transfer level: Needs  assistance Equipment used: Rolling walker (2 wheeled) Transfers: Sit to/from Stand Sit to Stand: Min guard         General transfer comment: Verbal cueing for hand placement and sequencing. No dizziness or unsteadiness noted with BUE support on RW. Performed on 2L O2.   Ambulation/Gait Ambulation/Gait assistance: Min guard Ambulation Distance (Feet): 15 Feet Assistive device: Rolling walker (2 wheeled) Gait Pattern/deviations: Step-to pattern;Wide base of support;Trunk flexed     General Gait Details: Ambulated 57ft from EOB to recliner, then 8ft from recliner to/from door with RW and min guard. Verbal cueing for RW placement relative to O2 tank and IV - pt demonstrated slight impulsivity with this. Ambulated on 2L of O2. SAO2 after ambulating 85% with mild SOB noted. SAO2 recovered to >90% in 3-17min seated.   Stairs            Wheelchair Mobility    Modified Rankin (Stroke Patients Only)       Balance Overall balance assessment: No apparent balance deficits (not formally assessed);History of Falls (Fell 2-3 months ago - spinal fractures requiring kyphoplasty)                                           Pertinent Vitals/Pain Pain Assessment: No/denies pain    Home Living Family/patient expects to be discharged to:: Private residence Living Arrangements: Children (Lives with adult son who cares for her) Available Help at Discharge: Family;Available 24 hours/day Type of Home: House Home Access:  Stairs to enter Entrance Stairs-Rails: Can reach both Entrance Stairs-Number of Steps: 5 Home Layout: One level Home Equipment: Walker - 2 wheels      Prior Function Level of Independence: Independent with assistive device(s)         Comments: Pt has RW at home, but does not often ambulate with it. Pt states she is independent with ADL's (bathing, dressing, etc.). Her son helps with cooking, cleaning, etc. PRN.     Hand Dominance         Extremity/Trunk Assessment   Upper Extremity Assessment Upper Extremity Assessment: Generalized weakness (4-/5 B for elbow flex/ext and grip)    Lower Extremity Assessment Lower Extremity Assessment: Generalized weakness (No MMT performed. Grossly 4-/5 for DF/PF and knee flex/ext)       Communication   Communication: No difficulties  Cognition Arousal/Alertness: Awake/alert Behavior During Therapy: WFL for tasks assessed/performed Overall Cognitive Status: Within Functional Limits for tasks assessed                                 General Comments: Pt slightly impulsive with RW - verbal cueing to watch wheel placement relative to IV pole and O2 tank. Cueing required to slow down as needed - pt tended to push RW way out in front of her.      General Comments      Exercises Other Exercises Other Exercises: Supine ther-ex x10 B included: ankle pumps, SLR's, hip abd, and glute squeezes. Performed with supervision.   Assessment/Plan    PT Assessment Patient needs continued PT services  PT Problem List Decreased strength;Decreased activity tolerance;Decreased mobility;Cardiopulmonary status limiting activity       PT Treatment Interventions Gait training;Stair training;Therapeutic activities;Therapeutic exercise;Patient/family education    PT Goals (Current goals can be found in the Care Plan section)  Acute Rehab PT Goals Patient Stated Goal: to get stronger PT Goal Formulation: With patient Time For Goal Achievement: 01/18/17 Potential to Achieve Goals: Fair    Frequency Min 2X/week   Barriers to discharge        Co-evaluation               AM-PAC PT "6 Clicks" Daily Activity  Outcome Measure Difficulty turning over in bed (including adjusting bedclothes, sheets and blankets)?: A Little Difficulty moving from lying on back to sitting on the side of the bed? : Total Difficulty sitting down on and standing up from a chair with arms (e.g.,  wheelchair, bedside commode, etc,.)?: Total Help needed moving to and from a bed to chair (including a wheelchair)?: A Little Help needed walking in hospital room?: A Little Help needed climbing 3-5 steps with a railing? : A Lot 6 Click Score: 13    End of Session Equipment Utilized During Treatment: Gait belt;Oxygen Activity Tolerance: Patient limited by fatigue (Limited by SOB) Patient left: in chair;with call bell/phone within reach;with chair alarm set;with family/visitor present Nurse Communication: Mobility status (Pt qualified for O2 at home - SAO2 at 86% on room air) PT Visit Diagnosis: Other abnormalities of gait and mobility (R26.89);Muscle weakness (generalized) (M62.81);History of falling (Z91.81)    Time: 1428-1500 PT Time Calculation (min) (ACUTE ONLY): 32 min   Charges:         PT G Codes:        Donaciano Eva, PT, SPT  Marni Griffon 01/04/2017, 4:21 PM

## 2017-01-04 NOTE — Progress Notes (Addendum)
Melody Green at [facility]                                                                                                                                                                                  Patient Demographics   Melody Green, is a 76 y.o. female, DOB - 09-20-1940, OZD:[id number]  Admit date - 01/02/2017   Admitting Physician Demetrios Loll, MD  Outpatient Primary MD for the patient is Melody Roys, DO   LOS - 2  Subjective: Patient admitted with acute respiratory failure.  Cough with green and pinkish sputum, shortness of breath. Off BiPAP, on oxygen [place] 2L. Review of Systems:   CONSTITUTIONAL: No documented fever. Has Generalized weakness. No weight gain, no weight loss.  EYES: No blurry or double vision.  ENT: No tinnitus. No postnasal drip. No redness of the oropharynx.  RESPIRATORY: Positive shortness of breath, cough with sputum. CARDIOVASCULAR: No chest pain. No orthopnea. No palpitations. No syncope.  GASTROINTESTINAL: No nausea, no vomiting or diarrhea. No abdominal pain. No melena or hematochezia.  GENITOURINARY: No dysuria or hematuria.  ENDOCRINE: No polyuria or nocturia. No heat or cold intolerance.  HEMATOLOGY: No anemia. No bruising. No bleeding.  INTEGUMENTARY: No rashes. No lesions.  MUSCULOSKELETAL: No arthritis. No swelling. No gout.  NEUROLOGIC: No numbness, tingling, or ataxia. No seizure-type activity.  PSYCHIATRIC: No anxiety. No insomnia. No ADD.    Vitals:   Vitals:   01/03/17 2141 01/04/17 0200 01/04/17 0500 01/04/17 1514  BP: 124/72  135/68 140/82  Pulse: 88  86 100  Resp: 20  16 18   Temp: (!) 97.5 F (36.4 C)  97.6 F (36.4 C) 97.6 F (36.4 C)  TempSrc: Oral  Oral Oral  SpO2: 93% 96% 96% 92%  Weight:      Height:        Wt Readings from Last 3 Encounters:  01/02/17 74 lb 11.8 oz (33.9 kg)  01/01/17 71 lb 9.6 oz (32.5 kg)  12/28/16 72 lb 8 oz (32.9 kg)     Intake/Output Summary (Last 24 hours) at  01/04/17 1602 Last data filed at 01/04/17 1352  Gross per 24 hour  Intake           1377.5 ml  Output              475 ml  Net            902.5 ml    Physical Exam:   GENERAL:Critically ill-appearing HEAD, EYES, EARS, NOSE AND THROAT: Atraumatic, normocephalic. Extraocular muscles are intact. Pupils equal and reactive to light. Sclerae anicteric. No conjunctival injection. No oro-pharyngeal erythema.  NECK: Supple. There is no  jugular venous distention. No bruits, no lymphadenopathy, no thyromegaly.  HEART: Regular rate and rhythm,. No murmurs, no rubs, no clicks.  LUNGS: Decreased breath sounds, has rhonchi but no wheezing. No use of accessory muscles of respiration.  ABDOMEN: Soft, flat, nontender, nondistended. Has good bowel sounds. No hepatosplenomegaly appreciated.  EXTREMITIES: No evidence of any cyanosis, clubbing, or peripheral edema.  +2 pedal and radial pulses bilaterally.  NEUROLOGIC: The patient is alert, awake, and oriented x3 with no focal motor or sensory deficits appreciated bilaterally.  SKIN: Moist and warm with no rashes appreciated.  Psych: Not anxious, depressed LN: No inguinal LN enlargement    Antibiotics   Anti-infectives    Start     Dose/Rate Route Frequency Ordered Stop   01/03/17 1000  cefTRIAXone (ROCEPHIN) 1 g in dextrose 5 % 50 mL IVPB     1 g 100 mL/hr over 30 Minutes Intravenous Every 24 hours 01/03/17 0834     01/02/17 2015  cefTRIAXone (ROCEPHIN) 1 g in dextrose 5 % 50 mL IVPB     1 g 100 mL/hr over 30 Minutes Intravenous  Once 01/02/17 2003 01/02/17 2120   01/02/17 2015  azithromycin (ZITHROMAX) 500 mg in dextrose 5 % 250 mL IVPB     500 mg 250 mL/hr over 60 Minutes Intravenous  Once 01/02/17 2003 01/02/17 2215      Medications   Scheduled Meds: . chlorhexidine  15 mL Mouth Rinse BID  . enoxaparin (LOVENOX) injection  30 mg Subcutaneous Q24H  . feeding supplement (ENSURE ENLIVE)  237 mL Oral BID BM  . ipratropium-albuterol  3 mL  Nebulization Q6H  . [START ON 01/05/2017] levothyroxine  75 mcg Oral QAC breakfast  . mouth rinse  15 mL Mouth Rinse q12n4p  . multivitamin with minerals  1 tablet Oral Daily  . pantoprazole  40 mg Oral QAC supper  . [START ON 01/05/2017] predniSONE  50 mg Oral Q breakfast   Continuous Infusions: . cefTRIAXone (ROCEPHIN)  IV Stopped (01/04/17 0943)  . lactated ringers 75 mL/hr at 01/04/17 0551   PRN Meds:.acetaminophen **OR** acetaminophen, albuterol, bisacodyl, guaiFENesin, ketorolac, [DISCONTINUED] ondansetron **OR** ondansetron (ZOFRAN) IV, senna-docusate   Data Review:   Micro Results Recent Results (from the past 240 hour(s))  Culture, blood (routine x 2)     Status: None (Preliminary result)   Collection Time: 01/02/17  5:24 PM  Result Value Ref Range Status   Specimen Description RIGHT ANTECUBITAL  Final   Special Requests   Final    BOTTLES DRAWN AEROBIC AND ANAEROBIC Blood Culture adequate volume   Culture NO GROWTH 2 DAYS  Final   Report Status PENDING  Incomplete  Culture, blood (routine x 2)     Status: None (Preliminary result)   Collection Time: 01/02/17  5:24 PM  Result Value Ref Range Status   Specimen Description BLOOD RIGHT FOREARM  Final   Special Requests   Final    BOTTLES DRAWN AEROBIC AND ANAEROBIC Blood Culture adequate volume   Culture NO GROWTH 2 DAYS  Final   Report Status PENDING  Incomplete  MRSA PCR Screening     Status: None   Collection Time: 01/02/17  9:40 PM  Result Value Ref Range Status   MRSA by PCR NEGATIVE NEGATIVE Final    Comment:        The GeneXpert MRSA Assay (FDA approved for NASAL specimens only), is one component of a comprehensive MRSA colonization surveillance program. It is not intended to diagnose MRSA infection nor  to guide or monitor treatment for MRSA infections.     Radiology Reports Dg Chest 2 View  Result Date: 01/01/2017 CLINICAL DATA:  Known lung carcinoma with cough EXAM: CHEST  2 VIEW COMPARISON:  12/06/2016  FINDINGS: Cardiac shadow is stable. Patient is rotated to the left accentuating the mediastinal markings. Multiple calcified lymph nodes are noted throughout the hila and mediastinum. Nodular density is noted just below the left hilum consistent with the nodule seen on prior CT examination. Small left pleural effusion and likely left basilar atelectasis is noted. Right chest wall port is again noted. Multilevel vertebral augmentation is seen. Some progression of the previously seen T6 compression deformity is noted. IMPRESSION: New left basilar atelectasis and small left effusion. Left lower lobe nodule.  Stable from previous CT. Changes of prior granulomatous disease. Electronically Signed   By: Inez Catalina M.D.   On: 01/01/2017 11:57   Ct Chest W Contrast  Result Date: 12/06/2016 CLINICAL DATA:  Left lung cancer diagnosed in September. On chemotherapy. Cough and intermittent chest pain. Current smoker. EXAM: CT CHEST WITH CONTRAST TECHNIQUE: Multidetector CT imaging of the chest was performed during intravenous contrast administration. CONTRAST:  72mL ISOVUE-300 IOPAMIDOL (ISOVUE-300) INJECTION 61% COMPARISON:  09/08/2016.  PET 07/17/2016. FINDINGS: Cardiovascular: Advanced aortic and branch vessel atherosclerosis. Normal heart size. Multivessel coronary artery atherosclerosis. No central pulmonary embolism, on this non-dedicated study Mediastinum/Nodes: New right supraclavicular/ low jugular adenopathy at 9 mm on image 16/series infiltrative mediastinal tumor/adenopathy. fills the subcarinal station and AP window regions. Subcarinal component measures 4.0 x 3.3 cm on image 61/series 2.Partially calcified right infrahilar node is newly enlarged 1.8 cm on image 80/series 2. Left infrahilar adenopathy versus central extension of pulmonary metastasis on image 69/series 2.Upper esophageal fluid level on image 28/series 2. Lungs/Pleura: New small left pleural effusion. New soft tissue density within the right  middle lobe bronchus and branches, including on image 79/series 3. Left upper lobe nodularity, much of which is within the bronchi, is new, including on image 33/series 3. Redemonstration of scarring in the left lower lobe. New areas of surrounding nodularity. Example medially at 12 mm on image 87/ series 3. Upper Abdomen: Beam hardening artifact from vertebral augmentation. Normal imaged portions of the spleen, liver, stomach, pancreas, adrenal glands, kidneys. Abdominal aortic and branch vessel atherosclerosis. Musculoskeletal: Osteopenia. Vertebral augmentation at T10 through L2. Mild T6 compression deformity is unchanged. IMPRESSION: 1. Findings most consistent with extensive recurrent and progressive disease. Adenopathy throughout the mediastinum, low neck, and within the right infrahilar region. New and increased pulmonary nodularity as detailed above. 2. New small left pleural effusion. 3.  Coronary artery atherosclerosis. Aortic atherosclerosis. 4. Esophageal air fluid level suggests dysmotility or gastroesophageal reflux. These results will be called to the ordering clinician or representative by the Radiologist Assistant, and communication documented in the PACS or zVision Dashboard. Electronically Signed   By: Abigail Miyamoto M.D.   On: 12/06/2016 13:47   Dg Chest Port 1 View  Result Date: 01/02/2017 CLINICAL DATA:  Shortness of breath and patient with history of lung carcinoma. EXAM: PORTABLE CHEST 1 VIEW COMPARISON:  CT chest 12/06/2016. Plain film of the chest 01/01/2017. FINDINGS: Volume loss in the left chest with left basilar atelectasis and small effusion are again identified. The right lung is hyperexpanded and clear. Heart size is normal. Aortic atherosclerosis is noted. IMPRESSION: No change in volume loss in the left chest this patient with known carcinoma. Small left effusion and basilar atelectasis are seen. Emphysema. Electronically Signed  By: Inge Rise M.D.   On: 01/02/2017 17:48      CBC  Recent Labs Lab 01/02/17 1723 01/03/17 0359  WBC 11.0 14.2*  HGB 14.3 12.6  HCT 43.6 37.8  PLT 222 198  MCV 95.6 95.8  MCH 31.4 31.9  MCHC 32.8 33.3  RDW 14.9* 14.5  LYMPHSABS 0.5*  --   MONOABS 0.5  --   EOSABS 0.0  --   BASOSABS 0.0  --     Chemistries   Recent Labs Lab 01/02/17 1723 01/03/17 0359  NA 134* 137  K 4.3 3.9  CL 99* 102  CO2 27 23  GLUCOSE 110* 200*  BUN 20 24*  CREATININE 0.91 1.03*  CALCIUM 9.1 8.6*  AST 34  --   ALT 23  --   ALKPHOS 82  --   BILITOT 1.1  --    ------------------------------------------------------------------------------------------------------------------ estimated creatinine clearance is 24.5 mL/min (A) (by C-G formula based on SCr of 1.03 mg/dL (H)). ------------------------------------------------------------------------------------------------------------------ No results for input(s): HGBA1C in the last 72 hours. ------------------------------------------------------------------------------------------------------------------ No results for input(s): CHOL, HDL, LDLCALC, TRIG, CHOLHDL, LDLDIRECT in the last 72 hours. ------------------------------------------------------------------------------------------------------------------ No results for input(s): TSH, T4TOTAL, T3FREE, THYROIDAB in the last 72 hours.  Invalid input(s): FREET3 ------------------------------------------------------------------------------------------------------------------ No results for input(s): VITAMINB12, FOLATE, FERRITIN, TIBC, IRON, RETICCTPCT in the last 72 hours.  Coagulation profile  Recent Labs Lab 01/02/17 1723  INR 0.97    No results for input(s): DDIMER in the last 72 hours.  Cardiac Enzymes  Recent Labs Lab 01/02/17 1723  TROPONINI <0.03   ------------------------------------------------------------------------------------------------------------------ Invalid input(s): POCBNP    Assessment & Plan   Patient is a 76 year old admitted with acute respiratory failure  1. Acute respiratory failure with hypoxia due to COPD exacerbation and lung cancer. Off BiPAP, on O2 [place] 2L. on IV Solu-Medrol,Changed to prednisone, continue duoneb, Continue rocephin. She may benefit from home oxygen and nebulizer machine.  2. Metastatic lung cancer. Patient with extensive metastatic disease noted on a CT scan recently prognosis very poor. They plan to follow with oncologist on Monday for next cycle of chemotherapy. Per palliative care consult, the patient is not ready for hospice care. For now, may consider hospice options in the near future. Agreeable with palliative services to follow on discharge.  3. Hypertension. Continue home hypertension medication.Blood pressure stable  4. Generalized weakness. PT evaluation.  I discussed this palliative care nurse practitioner, Jinny Blossom.     Code Status Orders        Start     Ordered   01/02/17 2139  Do not attempt resuscitation (DNR)  Continuous    Question Answer Comment  In the event of cardiac or respiratory ARREST Do not call a "code blue"   In the event of cardiac or respiratory ARREST Do not perform Intubation, CPR, defibrillation or ACLS   In the event of cardiac or respiratory ARREST Use medication by any route, position, wound care, and other measures to relive pain and suffering. May use oxygen, suction and manual treatment of airway obstruction as needed for comfort.      01/02/17 2138    Code Status History    Date Active Date Inactive Code Status Order ID Comments User Context   08/03/2016  2:14 PM 08/03/2016  5:51 PM Full Code [id number]  Hessie Knows, MD Inpatient   07/23/2015  4:05 PM 07/27/2015  2:23 PM Full Code [id number]  Jeanie Cooks, MD ED    Advance Directive Documentation     Most  Recent Value  Type of Advance Directive  Living will  Pre-existing out of facility DNR order (yellow form or pink MOST form)  -  "MOST" Form in  Place?  -           Consults  Intensivist  DVT Prophylaxis  Lovenox   Lab Results  Component Value Date   PLT 198 01/03/2017     Time Spent in minutes   37 min Greater than 50% of time spent in care coordination and counseling patient regarding the condition and plan of care.   Demetrios Loll M.D on 01/04/2017 at 4:02 PM  Between 7am to 6pm - Pager - [phone]  After 6pm go to [email or web address] - password [email or web address] [place] [facility]   Office  [phone]

## 2017-01-04 NOTE — Progress Notes (Signed)
New referral for Home Palliative services received from [facility]. Patient information faxed to referral. Thank you. Flo Shanks RN, BSN, [facility] [place], hospital liaison [phone] c

## 2017-01-05 MED ORDER — PREDNISONE 10 MG PO TABS: ORAL_TABLET | ORAL | 0 refills | Status: AC

## 2017-01-05 MED ORDER — IPRATROPIUM-ALBUTEROL 0.5-2.5 (3) MG/3ML IN SOLN: 3.0000 mL | Freq: Four times a day (QID) | RESPIRATORY_TRACT | 2 refills | Status: AC | PRN

## 2017-01-05 NOTE — Progress Notes (Signed)
Daily Progress Note   Patient Name: Melody Green       Date: 01/05/2017 DOB: 1940-09-26  Age: 76 y.o. MRN#: [id number] Attending Physician: Demetrios Loll, MD Primary Care Physician: Valerie Roys, DO Admit Date: 01/02/2017  Reason for Consultation/Follow-up: Establishing goals of care  Subjective/GOC: Received call from son/POA Mikeal Hawthorne. He has spoke with his mother and brothers and they have now chosen for her to return home with hospice services. Educated on hospice services with focus on comfort, quality, and dignity. Discussed medications as needed for symptom management and preventing re-hospitalization. Answered all questions and concerns.   Visited with patient at bedside. Denies pain or discomfort. Smiles and tells me she is going home today. Offered emotional and spiritual support.   Length of Stay: 3  Current Medications: Scheduled Meds:  . chlorhexidine  15 mL Mouth Rinse BID  . enoxaparin (LOVENOX) injection  30 mg Subcutaneous Q24H  . feeding supplement (ENSURE ENLIVE)  237 mL Oral BID BM  . ipratropium-albuterol  3 mL Nebulization Q6H  . levothyroxine  75 mcg Oral QAC breakfast  . mouth rinse  15 mL Mouth Rinse q12n4p  . multivitamin with minerals  1 tablet Oral Daily  . pantoprazole  40 mg Oral QAC supper  . predniSONE  50 mg Oral Q breakfast    Continuous Infusions: . cefTRIAXone (ROCEPHIN)  IV Stopped (01/05/17 1319)  . lactated ringers Stopped (01/05/17 1320)   PRN Meds: acetaminophen **OR** acetaminophen, albuterol, bisacodyl, guaiFENesin, ketorolac, [DISCONTINUED] ondansetron **OR** ondansetron (ZOFRAN) IV, senna-docusate  Physical Exam  Constitutional: She is oriented to person, place, and time. She appears cachectic. She is cooperative.  HENT:  Head:  Normocephalic and atraumatic.  Cardiovascular: Regular rhythm.   Pulmonary/Chest: No accessory muscle usage. No tachypnea. No respiratory distress. She has decreased breath sounds.  Intermittent dyspnea at rest  Abdominal: Normal appearance.  Neurological: She is alert and oriented to person, place, and time.  Skin: Skin is warm and dry.  Psychiatric: She has a normal mood and affect. Her speech is normal and behavior is normal. Cognition and memory are normal.  Nursing note and vitals reviewed.          Vital Signs: BP (!) 150/88 (BP Location: Left Arm)   Pulse 90   Temp 97.8 F (36.6 C) (Oral)  Resp 20   Ht 5\' 3"  (1.6 m)   Wt 33.9 kg (74 lb 11.8 oz)   SpO2 96%   BMI 13.24 kg/m  SpO2: SpO2: 96 % O2 Device: O2 Device: Nasal Cannula O2 Flow Rate: O2 Flow Rate (L/min): 2 L/min  Intake/output summary:   Intake/Output Summary (Last 24 hours) at 01/05/17 1350 Last data filed at 01/05/17 1148  Gross per 24 hour  Intake             2310 ml  Output             1150 ml  Net             1160 ml   LBM: Last BM Date:  (pt can't remember/gave dulcolax) Baseline Weight: Weight: 32.2 kg (71 lb) Most recent weight: Weight: 33.9 kg (74 lb 11.8 oz)  Palliative Assessment/Data: PPS 40%   Flowsheet Rows     Most Recent Value  Intake Tab  Referral Department  Hospitalist  Unit at Time of Referral  ICU  Palliative Care Primary Diagnosis  Pulmonary  Date Notified  01/02/17  Palliative Care Type  New Palliative care  Reason for referral  Clarify Goals of Care  Date of Admission  01/02/17  Date first seen by Palliative Care  01/03/17  # of days Palliative referral response time  1 Day(s)  # of days IP prior to Palliative referral  0  Clinical Assessment  Palliative Performance Scale Score  40%  Psychosocial & Spiritual Assessment  Palliative Care Outcomes  Patient/Family meeting held?  Yes  Who was at the meeting?  patient, two sons, and daughter-in-law  Palliative Care Outcomes   Clarified goals of care, Counseled regarding hospice, Provided end of life care assistance, ACP counseling assistance, Provided psychosocial or spiritual support, Improved non-pain symptom therapy      Patient Active Problem List   Diagnosis Date Noted  . Small cell lung cancer ([place])   . Palliative care by specialist   . Goals of care, counseling/discussion   . DNR (do not resuscitate)   . Acute on chronic respiratory failure with hypoxia ([place]) 01/02/2017  . SOB (shortness of breath)   . Cough   . Dyspnea   . COPD exacerbation ([place])   . Adult failure to thrive 10/31/2016  . PVD (peripheral vascular disease) ([place]) 10/10/2016  . Stroke ([place])   . Primary cancer of left lung metastatic to other site ([place]) 03/12/2016  . Tobacco abuse 02/08/2016  . History of small bowel obstruction   . Protein-calorie malnutrition, severe 07/24/2015  . Abdominal pain 06/14/2015  . Hypothyroid 05/03/2015  . 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 Positional vertigo of both ears 05/03/2015  . Depression 12/23/2014  . Hyperlipidemia 12/23/2014  . Benign hypertension with CKD (chronic kidney disease) stage IV ([place]) 12/04/2014  . Dizziness 12/04/2014  . Hyponatremia 12/04/2014    Palliative Care Assessment & Plan   Patient Profile: 76 y.o. female  with past medical history of stage IV small cell lung cancer, stroke, pneumonia, osteoporosis, hypothyroidism, hyperlipidemia, depression, DDD, COPD, chronic kidney disease, anxiety, AAA admitted on 01/02/2017 with shortness of breath, cough, and wheezing. In ED, patient hypoxic in 70's and placed on BiPAP. Chest xray without infiltration. Admitted to stepdown and receiving IV steroids, nebulizers, and empiric ceftriaxone. Followed by Dr. Grayland Ormond. Note reviewed from 01/01/17 where end-of-life and hospice were discussed. At that time, patient/family wanting to continue aggressive treatment. Palliative medicine consultation for goals of care.  Assessment: Acute on  chronic respiratory failure COPD exacerbation Stage IV small cell lung cancer Left pleural effusion  Recommendations/Plan:  Spoke with patient/family again regarding disposition options. After multiple conversations, patient and family have chosen to be discharged home with hospice services.   Discussed with RN CM and Dr. Bridgett Larsson. Likely home today.   Code Status: DNR/DNI   Code Status Orders        Start     Ordered   01/02/17 2139  Do not attempt resuscitation (DNR)  Continuous    Question Answer Comment  In the event of cardiac or respiratory ARREST Do not call a "code blue"   In the event of cardiac or respiratory ARREST Do not perform Intubation, CPR, defibrillation or ACLS   In the event of cardiac or respiratory ARREST Use medication by any route, position, wound care, and other measures to relive pain and suffering. May use oxygen, suction and manual treatment of airway obstruction as needed for comfort.      01/02/17 2138    Code Status History    Date Active Date Inactive Code Status Order ID Comments User Context   08/03/2016  2:14 PM 08/03/2016  5:51 PM Full Code [id number]  Hessie Knows, MD Inpatient   07/23/2015  4:05 PM 07/27/2015  2:23 PM Full Code [id number]  Jeanie Cooks, MD ED    Advance Directive Documentation     Most Recent Value  Type of Advance Directive  Living will  Pre-existing out of facility DNR order (yellow form or pink MOST form)  -  "MOST" Form in Place?  -       Prognosis:   Unable to determine: guarded with acute on chronic respiratory failure secondary to small cell lung cancer and COPD.   Discharge Planning:  Home with Hospice  Care plan was discussed with patient and son Mikeal Hawthorne), Dr. Bridgett Larsson, RN CM, hospice liaison, RN  Thank you for allowing the Palliative Medicine Team to assist in the care of this patient.   Time In: 1335 Time Out: 1410 Total Time 79min Prolonged Time Billed  no       Greater than 50%  of this time was spent  counseling and coordinating care related to the above assessment and plan.  Ihor Dow, FNP-C Palliative Medicine Team  Phone: [phone] Fax: [phone]  Please contact Palliative Medicine Team phone at [phone] for questions and concerns.

## 2017-01-05 NOTE — Discharge Summary (Addendum)
[facility] [place] NAME: Melody Green    MR#:  [id number]  DATE OF BIRTH:  04-15-1941  DATE OF ADMISSION:  01/02/2017   ADMITTING PHYSICIAN: Demetrios Loll, MD  DATE OF DISCHARGE: 01/05/2017  PRIMARY CARE PHYSICIAN: Valerie Roys, DO   ADMISSION DIAGNOSIS:  Cough [R05] SOB (shortness of breath) [R06.02] COPD exacerbation (HCC) [J44.1] Dyspnea, unspecified type [R06.00] DISCHARGE DIAGNOSIS:  Active Problems:   Acute on chronic respiratory failure with hypoxia (HCC)   SOB (shortness of breath)   Cough   Dyspnea   COPD exacerbation (HCC)   Small cell lung cancer ([place])   Palliative care by specialist   Goals of care, counseling/discussion   DNR (do not resuscitate)  SECONDARY DIAGNOSIS:   Past Medical History:  Diagnosis Date  . Abdominal aneurysm ([place])   . Anxiety   . Cancer of left lung ([place]) 03/10/2016   lung  . Chronic kidney disease   . Collagenous colitis   . COPD (chronic obstructive pulmonary disease) ([place])   . DDD (degenerative disc disease), cervical   . Depression   . Hernia of abdominal cavity   . Hyperlipidemia   . Hypertension   . Hypothyroidism   . Osteoporosis   . Pneumonia   . Stroke ([place])   . Tobacco abuse   . Urine incontinence   . Vitamin B 12 deficiency    HOSPITAL COURSE:  Patient is a 76 year old admitted with acute respiratory failure  1. Acute respiratory failure with hypoxia due to COPD exacerbation and lung cancer. Off BiPAP, on O2 [place] 2L. on IV Solu-Medrol,Changed to prednisone, continue duoneb, Continue rocephin. She may benefit from home oxygen and nebulizer machine per palliative care.  2. Metastatic lung cancer. Patient with extensive metastatic disease noted on a CT scan recently prognosis very poor. They plan to follow with oncologist on Monday for next cycle of chemotherapy. Per palliative care consult, the patient is not ready for hospice care. For now, may consider hospice options  in the near future. Agreeable with palliative services to follow on discharge.  3. Hypertension. Continue home hypertension medication.Blood pressure stable  4. Generalized weakness. PT evaluation: HHPT. Per Jinny Blossom, palliative care team, the patient's family changed mind and wants the patient in hospice care at home. DISCHARGE CONDITIONS:  Stable, discharge to home with Hospice care today. CONSULTS OBTAINED:   DRUG ALLERGIES:   Allergies  Allergen Reactions  . Hydrocodone Itching and Rash  . Codeine Rash  . Oxycodone Itching and Rash   DISCHARGE MEDICATIONS:   Allergies as of 01/05/2017      Reactions   Hydrocodone Itching, Rash   Codeine Rash   Oxycodone Itching, Rash      Medication List    STOP taking these medications   albuterol 108 (90 Base) MCG/ACT inhaler Commonly known as:  PROVENTIL HFA;VENTOLIN HFA   furosemide 20 MG tablet Commonly known as:  LASIX     TAKE these medications   acetaminophen 500 MG tablet Commonly known as:  TYLENOL Take 1,000 mg by mouth every 6 (six) hours as needed for moderate pain or headache.   amLODipine 2.5 MG tablet Commonly known as:  NORVASC Take 1 tablet (2.5 mg total) by mouth daily.   atorvastatin 20 MG tablet Commonly known as:  LIPITOR Take 1 tablet (20 mg total) by mouth at bedtime.   bisacodyl 5 MG EC tablet Commonly known as:  DULCOLAX Take 5 mg by mouth daily as needed  for moderate constipation.   clopidogrel 75 MG tablet Commonly known as:  PLAVIX Take 75 mg by mouth at bedtime.   cyanocobalamin 1000 MCG/ML injection Commonly known as:  (VITAMIN B-12) Inject 1,000 mcg into the muscle every 30 (thirty) days.   cyclobenzaprine 10 MG tablet Commonly known as:  FLEXERIL Take 1 tablet (10 mg total) by mouth every 8 (eight) hours as needed for muscle spasms.   guaiFENesin 200 MG tablet Take 2 tablets (400 mg total) by mouth daily.   ipratropium-albuterol 0.5-2.5 (3) MG/3ML Soln Commonly known as:   DUONEB Take 3 mLs by nebulization every 6 (six) hours as needed.   levothyroxine 75 MCG tablet Commonly known as:  SYNTHROID, LEVOTHROID TAKE 1 TABLET EVERY DAY BEFORE BREAKFAST   lidocaine-prilocaine cream Commonly known as:  EMLA Apply to port site 1 hour before port is accessed each time   LORazepam 1 MG tablet Commonly known as:  ATIVAN TAKE 1 TABLET TWICE DAILY AS NEEDED FOR ANXIETY   mirtazapine 30 MG disintegrating tablet Commonly known as:  REMERON SOL-TAB Take 1 tablet (30 mg total) by mouth at bedtime.   omeprazole 40 MG capsule Commonly known as:  PRILOSEC Take 1 capsule (40 mg total) by mouth daily.   ondansetron 8 MG tablet Commonly known as:  ZOFRAN Take 1 tablet (8 mg total) by mouth 2 (two) times daily as needed for refractory nausea / vomiting.   predniSONE 10 MG tablet Commonly known as:  DELTASONE 40 mg po daily for 2 days, 20 mg po daily for 2 days, 10 mg po daily for 2 days. Start taking on:  01/06/2017   prochlorperazine 10 MG tablet Commonly known as:  COMPAZINE Take 1 tablet (10 mg total) by mouth every 6 (six) hours as needed (Nausea or vomiting).   traMADol 50 MG tablet Commonly known as:  ULTRAM Take 1 tablet (50 mg total) by mouth every 6 (six) hours as needed for moderate pain.            Durable Medical Equipment        Start     Ordered   01/05/17 1220  For home use only DME Nebulizer machine  Once    Question:  Patient needs a nebulizer to treat with the following condition  Answer:  COPD (chronic obstructive pulmonary disease) ([place])   01/05/17 1220   01/05/17 1220  For home use only DME Bedside commode  Once    Question:  Patient needs a bedside commode to treat with the following condition  Answer:  Weakness   01/05/17 1220   01/05/17 1107  For home use only DME oxygen  Once    Question Answer Comment  Mode or (Route) Nasal cannula   Liters per Minute 2   Frequency Continuous (stationary and portable oxygen unit needed)    Oxygen conserving device Yes   Oxygen delivery system Gas      01/05/17 1106       DISCHARGE INSTRUCTIONS:  See AVS.  If you experience worsening of your admission symptoms, develop shortness of breath, life threatening emergency, suicidal or homicidal thoughts you must seek medical attention immediately by calling 911 or calling your MD immediately  if symptoms less severe.  You Must read complete instructions/literature along with all the possible adverse reactions/side effects for all the Medicines you take and that have been prescribed to you. Take any new Medicines after you have completely understood and accpet all the possible adverse reactions/side effects.  Please note  You were cared for by a hospitalist during your hospital stay. If you have any questions about your discharge medications or the care you received while you were in the hospital after you are discharged, you can call the unit and asked to speak with the hospitalist on call if the hospitalist that took care of you is not available. Once you are discharged, your primary care physician will handle any further medical issues. Please note that NO REFILLS for any discharge medications will be authorized once you are discharged, as it is imperative that you return to your primary care physician (or establish a relationship with a primary care physician if you do not have one) for your aftercare needs so that they can reassess your need for medications and monitor your lab values.    On the day of Discharge:  VITAL SIGNS:  Blood pressure (!) 150/88, pulse 90, temperature 97.8 F (36.6 C), temperature source Oral, resp. rate 20, height 5\' 3"  (1.6 m), weight 74 lb 11.8 oz (33.9 kg), SpO2 96 %. PHYSICAL EXAMINATION:  GENERAL:  76 y.o.-year-old patient lying in the bed with no acute distress. fragile looking.  EYES: Pupils equal, round, reactive to light and accommodation. No scleral icterus. Extraocular muscles intact.   HEENT: Head atraumatic, normocephalic. Oropharynx and nasopharynx clear.  NECK:  Supple, no jugular venous distention. No thyroid enlargement, no tenderness.  LUNGS: Diminished breath sounds bilaterally, no wheezing, rales, some rhonchi. No use of accessory muscles of respiration.  CARDIOVASCULAR: S1, S2 normal. No murmurs, rubs, or gallops.  ABDOMEN: Soft, non-tender, non-distended. Bowel sounds present. No organomegaly or mass.  EXTREMITIES: No pedal edema, cyanosis, or clubbing.  NEUROLOGIC: Cranial nerves II through XII are intact. Muscle strength 4/5 in all extremities. Sensation intact. Gait not checked.  PSYCHIATRIC: The patient is alert and oriented x 3.  SKIN: No obvious rash, lesion, or ulcer.  DATA REVIEW:   CBC  Recent Labs Lab 01/03/17 0359  WBC 14.2*  HGB 12.6  HCT 37.8  PLT 198    Chemistries   Recent Labs Lab 01/02/17 1723 01/03/17 0359  NA 134* 137  K 4.3 3.9  CL 99* 102  CO2 27 23  GLUCOSE 110* 200*  BUN 20 24*  CREATININE 0.91 1.03*  CALCIUM 9.1 8.6*  AST 34  --   ALT 23  --   ALKPHOS 82  --   BILITOT 1.1  --      Microbiology Results  Results for orders placed or performed during the hospital encounter of 01/02/17  Culture, blood (routine x 2)     Status: None (Preliminary result)   Collection Time: 01/02/17  5:24 PM  Result Value Ref Range Status   Specimen Description RIGHT ANTECUBITAL  Final   Special Requests   Final    BOTTLES DRAWN AEROBIC AND ANAEROBIC Blood Culture adequate volume   Culture NO GROWTH 3 DAYS  Final   Report Status PENDING  Incomplete  Culture, blood (routine x 2)     Status: None (Preliminary result)   Collection Time: 01/02/17  5:24 PM  Result Value Ref Range Status   Specimen Description BLOOD RIGHT FOREARM  Final   Special Requests   Final    BOTTLES DRAWN AEROBIC AND ANAEROBIC Blood Culture adequate volume   Culture NO GROWTH 3 DAYS  Final   Report Status PENDING  Incomplete  MRSA PCR Screening     Status:  None   Collection Time: 01/02/17  9:40 PM  Result Value Ref Range Status   MRSA by PCR NEGATIVE NEGATIVE Final    Comment:        The GeneXpert MRSA Assay (FDA approved for NASAL specimens only), is one component of a comprehensive MRSA colonization surveillance program. It is not intended to diagnose MRSA infection nor to guide or monitor treatment for MRSA infections.     RADIOLOGY:  No results found.   Management plans discussed with the patient, family and they are in agreement.  CODE STATUS: DNR   TOTAL TIME TAKING CARE OF THIS PATIENT: 35 minutes.    Demetrios Loll M.D on 01/05/2017 at 1:48 PM  Between 7am to 6pm - Pager - [phone]  After 6pm go to [email or web address] - Proofreader  [facility]  Office  [phone]  CC: Primary care physician; Valerie Roys, DO   Note: This dictation was prepared with Dragon dictation along with smaller phrase technology. Any transcriptional errors that result from this process are unintentional.

## 2017-01-05 NOTE — Care Management (Signed)
Palliative care has reevaluated patient and recommend patient to discharge home with hospice.  Spoke with son Mikeal Hawthorne ad provided [employer] preference.  [facility] notified of referral.  Patient will need O2, nebulizer, and bsc.  RNCM signing off.

## 2017-01-05 NOTE — Discharge Instructions (Signed)
Hospice care at home. Fall precaution. Home O2 [place] 2L  Palliative follow up.

## 2017-01-05 NOTE — Progress Notes (Signed)
Notified by [facility] Melody Green that patient now would like to discharge home with hospice services. Writer spoke on the phone with patient's son Melody Green to arrange for equipment delivery. DME needs include: oxygen, nebulizer machine and BSC. Equipment t be delivered as soon as possible. Patient to discharge home via car when oxygen is in [place] in her home. Melody Green is 76 year old woman admitted with a past medical history of stage IV small cell lung cancer, stroke, pneumonia, osteoporosis, hypothyroidism, hyperlipidemia, depression, DDD, COPD, chronic kidney disease, anxiety, abdominal aortic aneurysm, admitted on 01/02/2017 with shortness of breath, cough, and wheezing. In the ED she was hypoxic and required Bipap. She has received IV steroids, nebulizer treatments and I(V antibiotics. She continues to require oxygen at 2 liters. Palliative Med cine was consulted for Goals of care and has had several meetings with patient and her family. They have now chosen Hospice services rather than home Palliative.  As above Probation officer spoke on the phone with patient's son Melody Green [phone]) to initiate education regarding hospice services, philosophy and team approach to care with good understanding voiced. Updated information and discharge summary faxed to referral. faxed to referral. Thank you. Flo Shanks RN, BSN, [facility] [place], hospital Liaison [phone] c

## 2017-01-07 LAB — CULTURE, BLOOD (ROUTINE X 2)
CULTURE: NO GROWTH
Culture: NO GROWTH
SPECIAL REQUESTS: ADEQUATE
SPECIAL REQUESTS: ADEQUATE

## 2017-01-07 LAB — BLOOD GAS, ARTERIAL
ACID-BASE DEFICIT: 3.9 mmol/L — AB (ref 0.0–2.0)
BICARBONATE: 22.2 mmol/L (ref 20.0–28.0)
Delivery systems: POSITIVE
Expiratory PAP: 5
FIO2: 1
Inspiratory PAP: 12
O2 SAT: 99.4 %
PATIENT TEMPERATURE: 37
PH ART: 7.32 — AB (ref 7.350–7.450)
PO2 ART: 169 mmHg — AB (ref 83.0–108.0)
pCO2 arterial: 43 mmHg (ref 32.0–48.0)

## 2017-01-08 ENCOUNTER — Inpatient Hospital Stay: Payer: Medicare HMO

## 2017-01-08 ENCOUNTER — Inpatient Hospital Stay: Payer: Medicare HMO | Admitting: Oncology

## 2017-01-16 ENCOUNTER — Telehealth: Payer: Self-pay | Admitting: *Deleted

## 2017-01-19 ENCOUNTER — Inpatient Hospital Stay: Payer: Medicare HMO | Admitting: Family Medicine

## 2017-01-23 ENCOUNTER — Other Ambulatory Visit: Payer: Medicare HMO

## 2017-01-24 ENCOUNTER — Inpatient Hospital Stay: Payer: Medicare HMO

## 2017-01-31 NOTE — Telephone Encounter (Signed)
Hospice Home call received with notice of death for Melody Green January 30, 2017 @ 2:20 AM

## 2017-01-31 DEATH — deceased

## 2017-02-07 IMAGING — US US ABDOMEN LIMITED
1 series · 4 of 4 positions shown · non-contrast
Comparison: None.

CLINICAL DATA: History of lung carcinoma with metastatic disease
and abdominal distention

EXAM:
LIMITED ABDOMEN ULTRASOUND FOR ASCITES
TECHNIQUE: Limited ultrasound survey for ascites was performed in all four
abdominal quadrants.

[Series 1: us abdomen limited · 0.23mm/px · 4 of 4 slices shown]
[im 1/4]
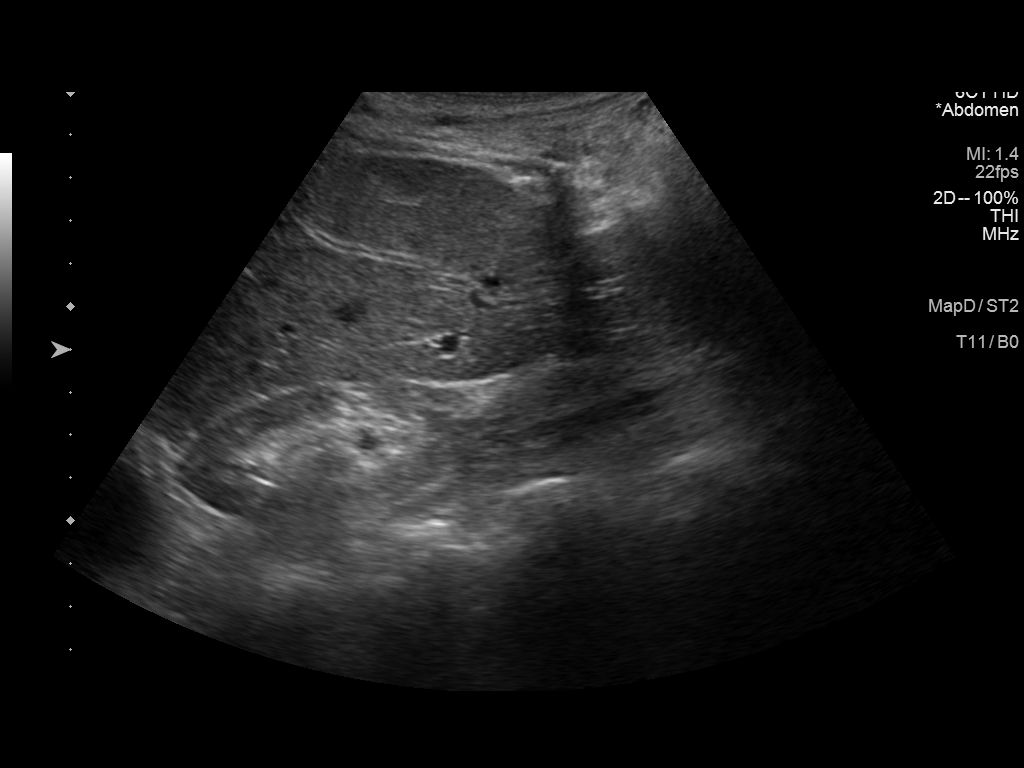
[im 2/4]
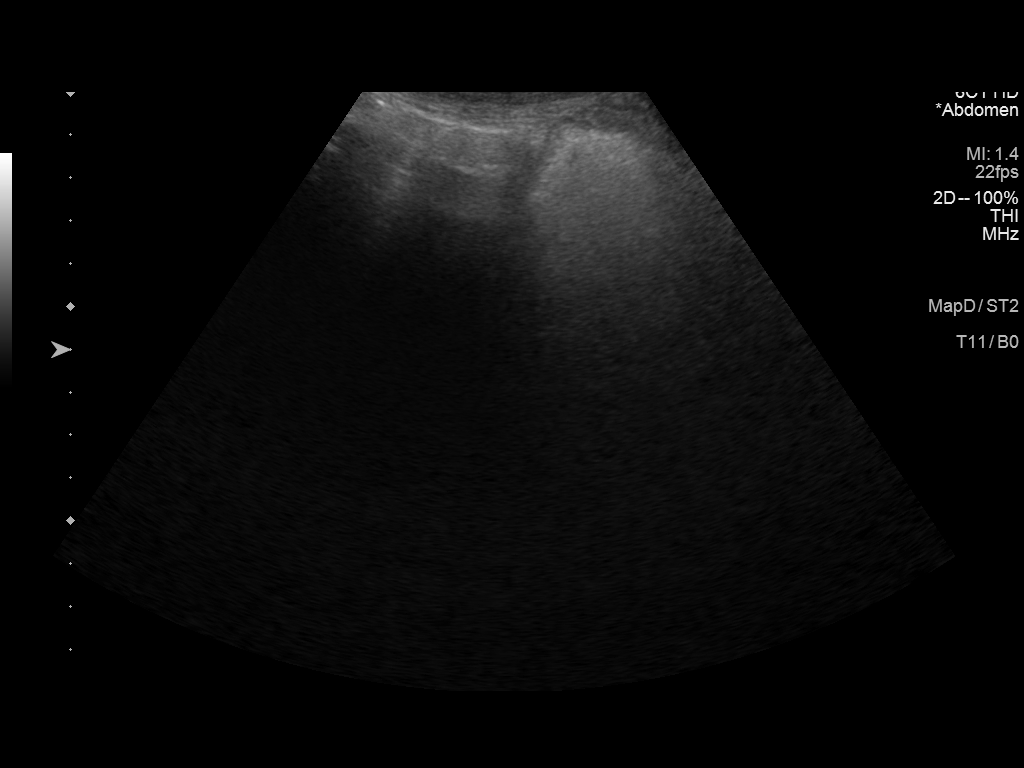
[im 3/4]
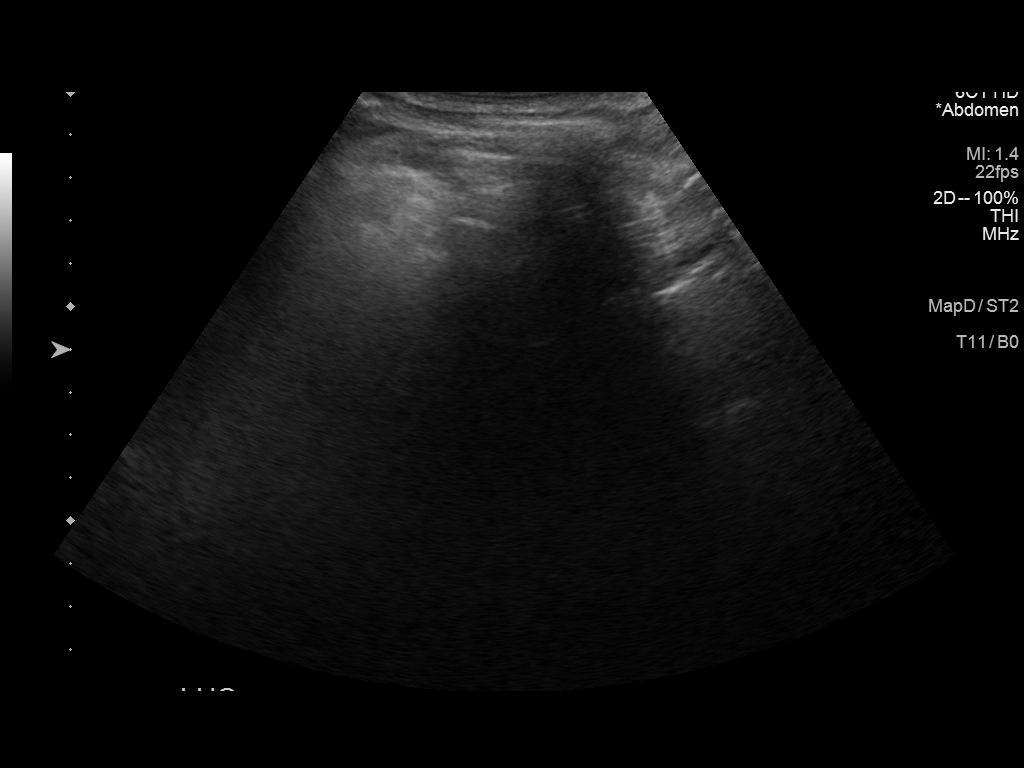
[im 4/4]
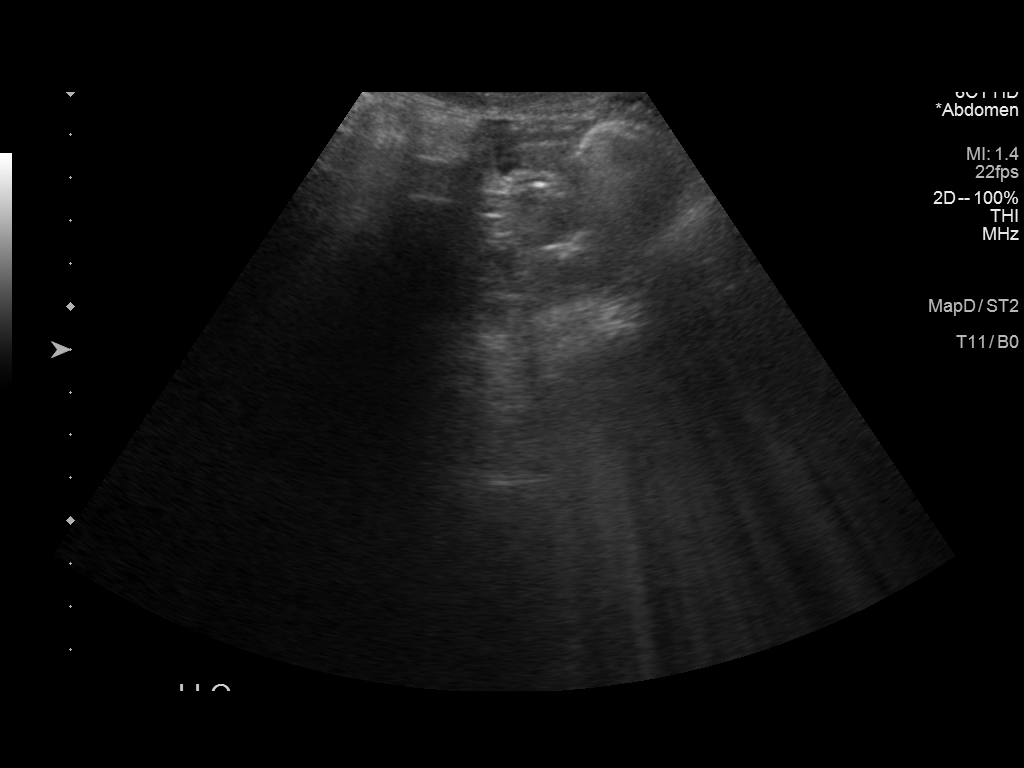

[4 of 4 positions shown; findings below may reference images not displayed]

FINDINGS: Four-quadrant scanning of the abdomen shows no evidence of ascites.
The scheduled paracentesis was canceled.
IMPRESSION: No evidence of significant ascites.

## 2017-02-14 ENCOUNTER — Other Ambulatory Visit: Payer: Self-pay | Admitting: Nurse Practitioner

## 2017-03-14 IMAGING — CR DG C-ARM 61-120 MIN
1 series · 1 of 1 positions shown · non-contrast
Comparison: 09/11/2013

FLUOROSCOPY TIME:  1 minutes 9 seconds

Dose:  1.4 mGy

Images obtained:  2

CLINICAL DATA: Kyphoplasty

EXAM:
LUMBAR SPINE - 2-3 VIEW; DG C-ARM 61-120 MIN

[cont.]
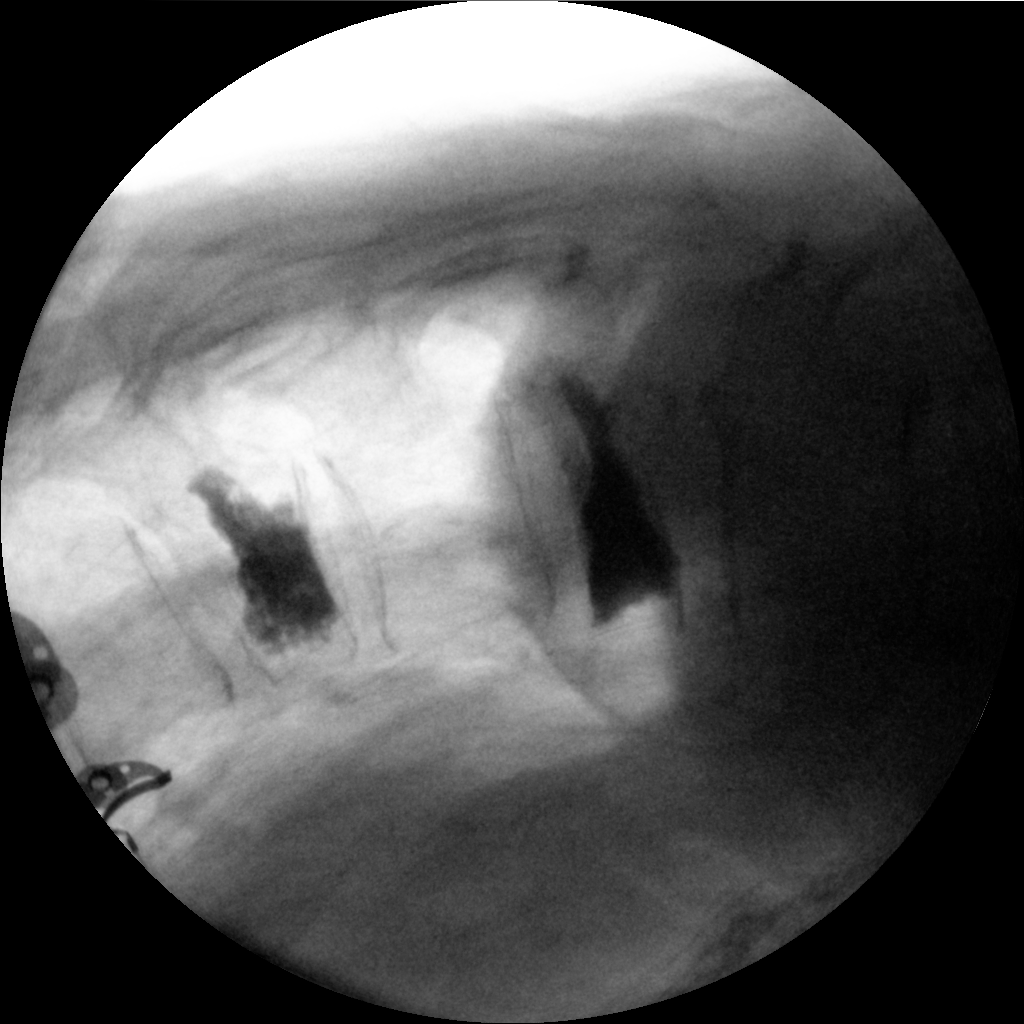

[1 of 1 positions shown; findings below may reference images not displayed]

FINDINGS: Marked osseous demineralization.

AP and lateral views of a section of the thoracolumbar spine are
submitted.

Unable to establish accurate spinal segment numbering due to lack of
landmarks.

Spinal augmentation procedures have been performed at 2 levels of
the spine with a single intervening vertebral body of normal height.

The more superior of the 2 levels was performed previously, with the
more caudal level new.

Both vertebra demonstrate mild anterior height losses.
IMPRESSION: Prior kyphoplasties at 2 levels of the thoracolumbar spine as above.

## 2017-09-03 IMAGING — DX DG CHEST 1V PORT
1 series · 1 of 1 positions shown · non-contrast
Comparison: CT chest 12/06/2016. Plain film of the chest
01/01/2017.

CLINICAL DATA: Shortness of breath and patient with history of lung
carcinoma.

EXAM:
PORTABLE CHEST 1 VIEW

[chest ap]
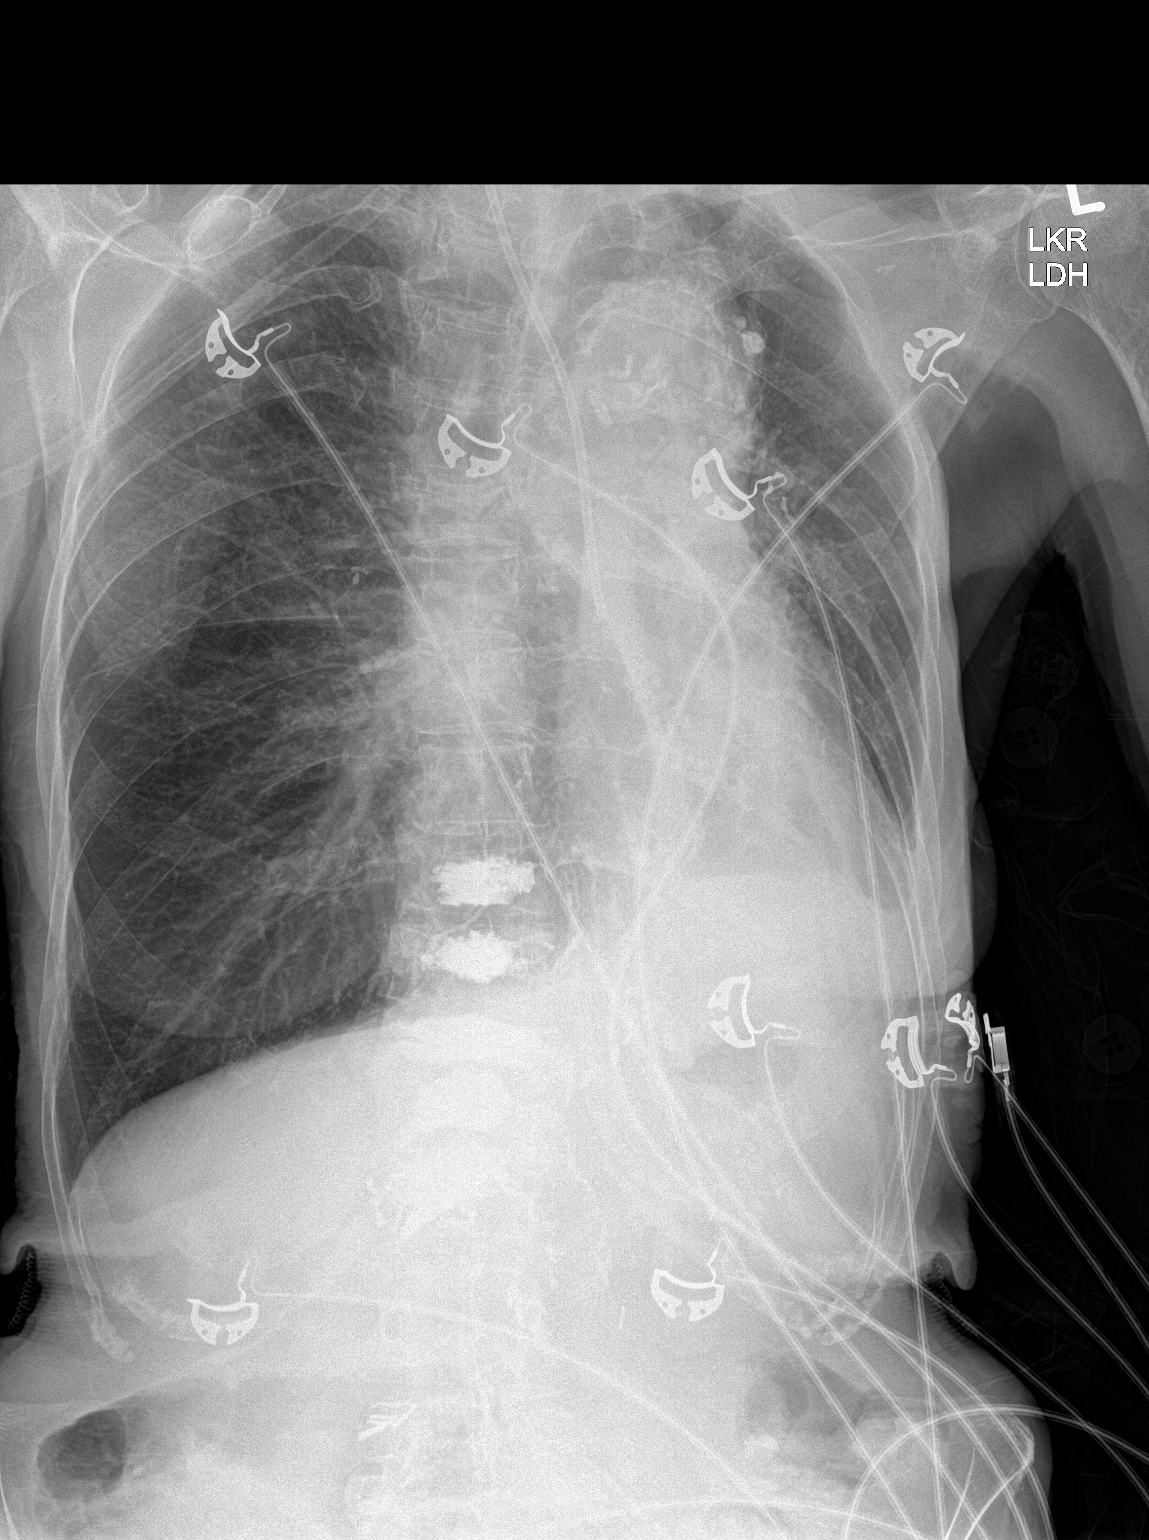

[1 of 1 positions shown; findings below may reference images not displayed]

FINDINGS: Volume loss in the left chest with left basilar atelectasis and
small effusion are again identified. The right lung is hyperexpanded
and clear. Heart size is normal. Aortic atherosclerosis is noted.
IMPRESSION: No change in volume loss in the left chest this patient with known
carcinoma. Small left effusion and basilar atelectasis are seen.

Emphysema.

## 2017-10-12 ENCOUNTER — Ambulatory Visit (INDEPENDENT_AMBULATORY_CARE_PROVIDER_SITE_OTHER): Payer: Medicare HMO | Admitting: Vascular Surgery

## 2017-10-12 ENCOUNTER — Encounter (INDEPENDENT_AMBULATORY_CARE_PROVIDER_SITE_OTHER): Payer: Medicare HMO
